# Patient Record
Sex: Female | Born: 1945 | Race: Black or African American | Hispanic: No | Smoking: Never smoker
Health system: Southern US, Community
[De-identification: ages and names within clinical notes are randomized; demographics above are authoritative.]

## PROBLEM LIST (undated history)

## (undated) DIAGNOSIS — E119 Type 2 diabetes mellitus without complications: Secondary | ICD-10-CM

## (undated) DIAGNOSIS — R42 Dizziness and giddiness: Secondary | ICD-10-CM

## (undated) DIAGNOSIS — Z9710 Presence of artificial limb (complete) (partial), unspecified: Secondary | ICD-10-CM

## (undated) DIAGNOSIS — I10 Essential (primary) hypertension: Secondary | ICD-10-CM

## (undated) HISTORY — PX: PARTIAL HYSTERECTOMY: SHX80

---

## 2007-12-21 ENCOUNTER — Ambulatory Visit: Payer: Self-pay | Admitting: Family Medicine

## 2007-12-31 ENCOUNTER — Ambulatory Visit: Payer: Self-pay | Admitting: Orthopedic Surgery

## 2008-01-01 ENCOUNTER — Ambulatory Visit: Payer: Self-pay | Admitting: Orthopedic Surgery

## 2010-05-29 IMAGING — CR LEFT WRIST - 2 VIEW
1 series · 2 of 2 positions shown · non-contrast
Comparison: none

REASON FOR EXAM: post op
COMMENTS:   LMP: Post-Menopausal

[Series 1: view not recorded · 0.17mm/px · 2 of 2 slices shown]
[im 1/2]
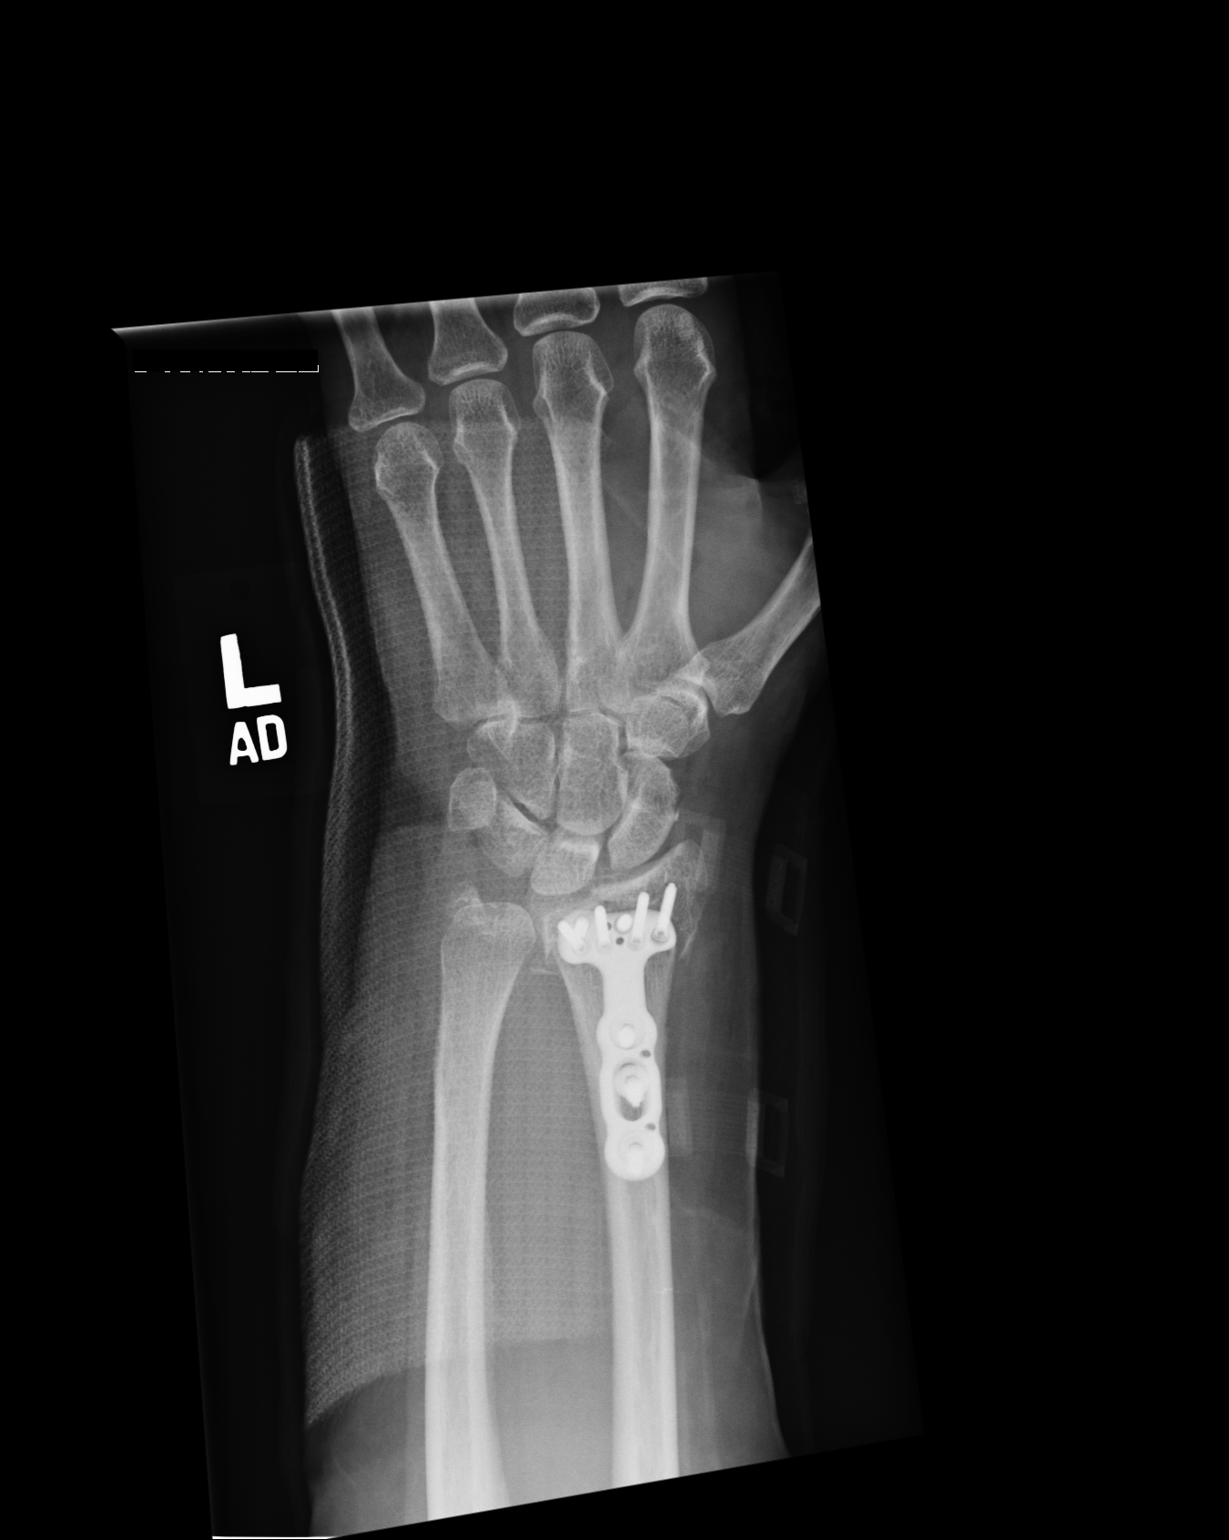
[im 2/2]
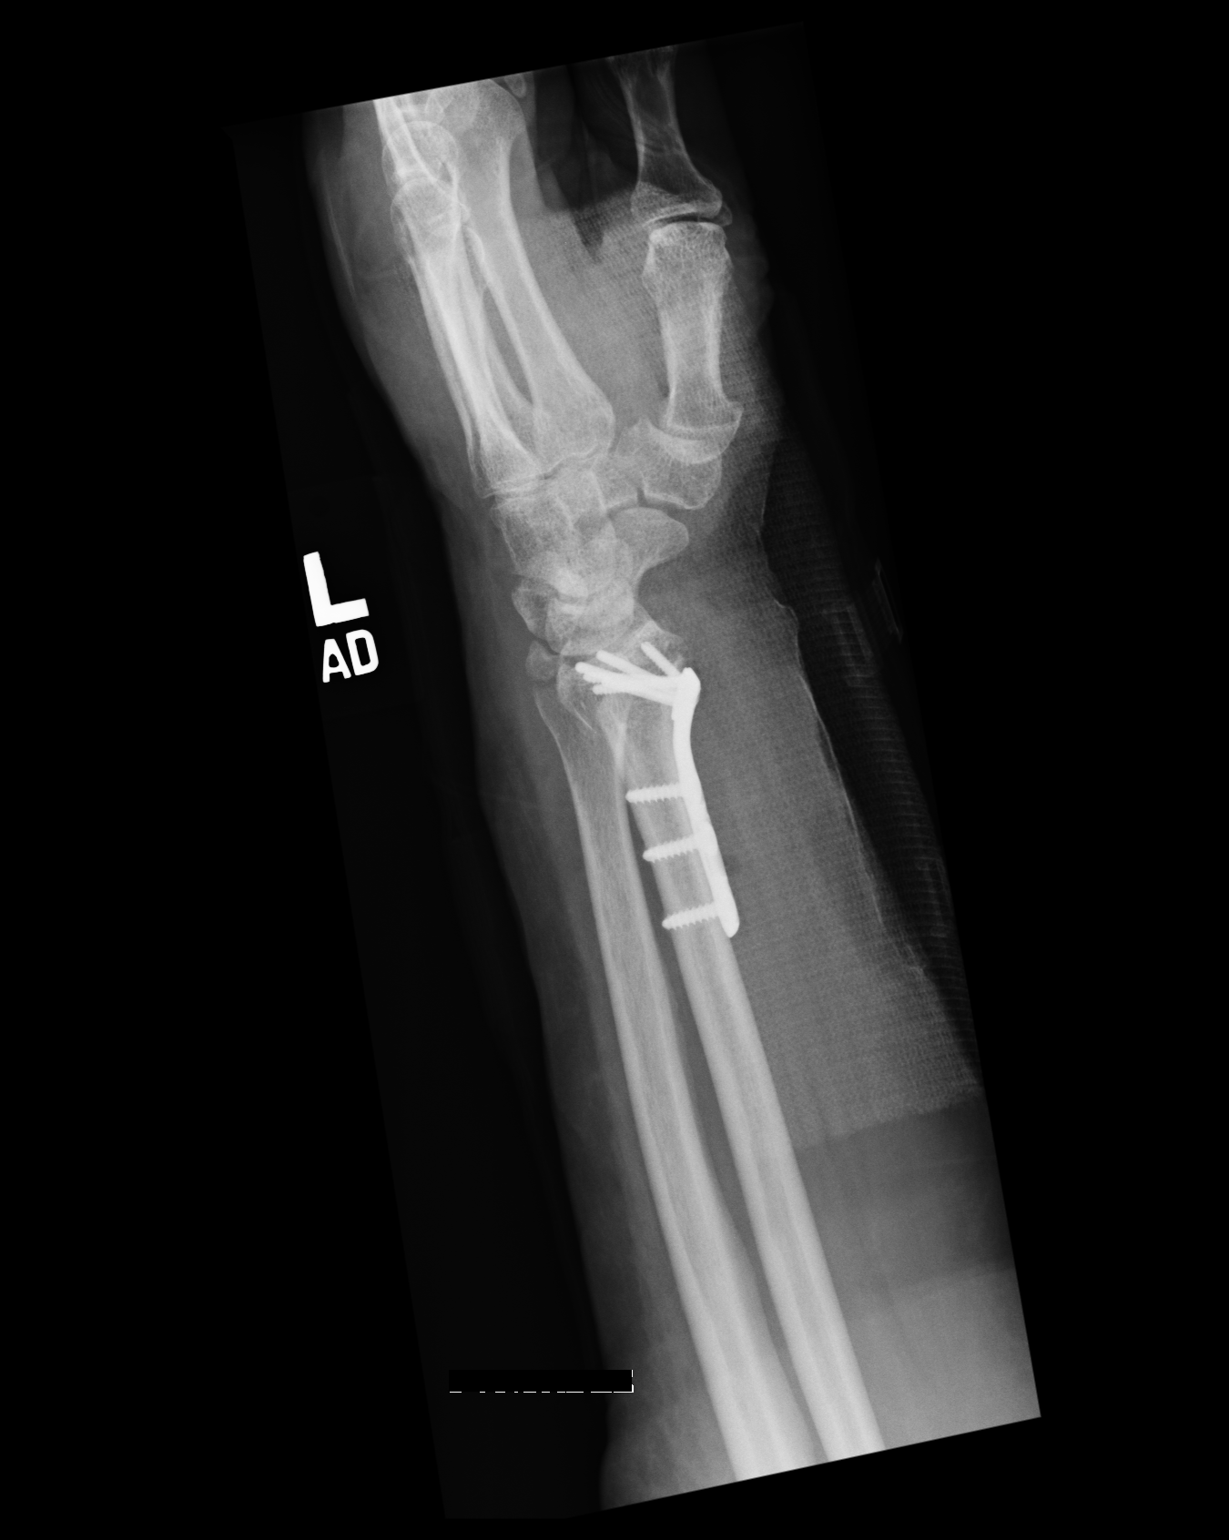

[2 of 2 positions shown; findings below may reference images not displayed]

PROCEDURE:     DXR - DXR WRIST LEFT AP AND LATERAL  - January 01, 2008 [DATE]

RESULT:     There is a comminuted fracture of the distal LEFT radius which
has been reduced and is stabilized by a T-shaped metallic plate with
multiple screws. There is approximately 2 mm step-off along the articular
plate of the distal LEFT radius.

Again observed is a fracture at the base of the ulnar styloid.
IMPRESSION: 1.     Fracture of the distal radius, now internally stabilized as noted
above.

## 2013-05-18 DIAGNOSIS — D649 Anemia, unspecified: Secondary | ICD-10-CM | POA: Insufficient documentation

## 2013-05-18 DIAGNOSIS — I10 Essential (primary) hypertension: Secondary | ICD-10-CM | POA: Insufficient documentation

## 2014-05-17 DIAGNOSIS — E119 Type 2 diabetes mellitus without complications: Secondary | ICD-10-CM | POA: Insufficient documentation

## 2014-05-17 DIAGNOSIS — E785 Hyperlipidemia, unspecified: Secondary | ICD-10-CM | POA: Insufficient documentation

## 2022-03-03 ENCOUNTER — Other Ambulatory Visit: Payer: Self-pay

## 2022-03-03 ENCOUNTER — Emergency Department
Admission: EM | Admit: 2022-03-03 | Discharge: 2022-03-03 | Disposition: A | Discharge status: Home / Self Care | Payer: Medicare Other | Attending: Emergency Medicine | Admitting: Emergency Medicine

## 2022-03-03 DIAGNOSIS — E1165 Type 2 diabetes mellitus with hyperglycemia: Secondary | ICD-10-CM | POA: Insufficient documentation

## 2022-03-03 DIAGNOSIS — I10 Essential (primary) hypertension: Secondary | ICD-10-CM | POA: Diagnosis not present

## 2022-03-03 DIAGNOSIS — R21 Rash and other nonspecific skin eruption: Secondary | ICD-10-CM | POA: Diagnosis present

## 2022-03-03 DIAGNOSIS — L500 Allergic urticaria: Secondary | ICD-10-CM | POA: Insufficient documentation

## 2022-03-03 DIAGNOSIS — T7840XA Allergy, unspecified, initial encounter: Secondary | ICD-10-CM

## 2022-03-03 HISTORY — DX: Essential (primary) hypertension: I10

## 2022-03-03 HISTORY — DX: Type 2 diabetes mellitus without complications: E11.9

## 2022-03-03 LAB — CBG MONITORING, ED
Glucose-Capillary: 386 mg/dL — ABNORMAL HIGH (ref 70–99)
Glucose-Capillary: 352 mg/dL — ABNORMAL HIGH (ref 70–99)

## 2022-03-03 MED ORDER — DIPHENHYDRAMINE HCL 50 MG/ML IJ SOLN
12.5000 mg | Freq: Once | INTRAMUSCULAR | Status: AC
  Administered 2022-03-03: 12.5 mg via INTRAVENOUS
  Filled 2022-03-03: qty 1

## 2022-03-03 MED ORDER — FAMOTIDINE IN NACL 20-0.9 MG/50ML-% IV SOLN
20.0000 mg | Freq: Once | INTRAVENOUS | Status: AC
  Administered 2022-03-03: 20 mg via INTRAVENOUS
  Filled 2022-03-03: qty 50

## 2022-03-03 MED ORDER — METHYLPREDNISOLONE SODIUM SUCC 125 MG IJ SOLR
125.0000 mg | Freq: Once | INTRAMUSCULAR | Status: AC
  Administered 2022-03-03: 125 mg via INTRAVENOUS
  Filled 2022-03-03: qty 2

## 2022-03-03 MED ORDER — DIPHENHYDRAMINE HCL 25 MG PO TABS: 12.5000 mg | ORAL_TABLET | Freq: Four times a day (QID) | ORAL | 0 refills | Status: DC | PRN

## 2022-03-03 MED ORDER — DIPHENHYDRAMINE HCL (SLEEP) 25 MG PO TABS: 12.5000 mg | ORAL_TABLET | Freq: Three times a day (TID) | ORAL | 0 refills | Status: DC | PRN

## 2022-03-03 MED ORDER — SODIUM CHLORIDE 0.9 % IV BOLUS
1000.0000 mL | Freq: Once | INTRAVENOUS | Status: AC
  Administered 2022-03-03: 1000 mL via INTRAVENOUS

## 2022-03-03 NOTE — ED Triage Notes (Addendum)
Pt reports rash and itching to bilateral arms and face since yesterday. She states symptoms started after eating tomatoes. Reports similar reaction many years ago. Pt is AxOx4. No resp distress noted. Pt denies any other complaints.

## 2022-03-03 NOTE — ED Provider Notes (Signed)
[facility] Provider Note    Event Date/Time   First MD Initiated Contact with Patient 03/03/22 1909     (approximate)   History   Allergic Reaction and Rash   HPI  Melanie Juarez is a 77 y.o. female with history of diabetes, hypertension, and as listed in the EMR presents to the emergency department for treatment and evaluation of the hives.  Symptoms started yesterday but resolved and then returned this evening.  She had a similar reaction several years ago to tomatoes.  She thought that because it had been so many years since she had had any reactions that she was no longer allergic to tomatoes and has been consuming tomato sandwiches over the past few days.  She denies any shortness of breath, feeling that her tongue or throat is swelling.  Hives seem to come and go.  No alleviating measures attempted prior to arrival.     Physical Exam   Triage Vital Signs: ED Triage Vitals  Enc Vitals Group     BP 03/03/22 1803 134/71     Pulse Rate 03/03/22 1803 84     Resp 03/03/22 1803 20     Temp 03/03/22 1805 97.8 F (36.6 C)     Temp Source 03/03/22 1805 Oral     SpO2 03/03/22 1803 99 %     Weight 03/03/22 1802 200 lb (90.7 kg)     Height 03/03/22 1802 5\' 7"  (1.702 m)     Head Circumference --      Peak Flow --      Pain Score 03/03/22 1812 0     Pain Loc --      Pain Edu? --      Excl. in [place]? --     Most recent vital signs: Vitals:   03/03/22 2200 03/03/22 2230  BP: (!) 141/65 139/66  Pulse: 86 85  Resp:    Temp:    SpO2: 98% 96%     General: Awake, no distress.  CV:  Good peripheral perfusion.  Resp:  Normal effort.  Abd:  No distention.  Other:  Urticarial lesions to face, arms, and hands   ED Results / Procedures / Treatments   Labs (all labs ordered are listed, but only abnormal results are displayed) Labs Reviewed  CBG MONITORING, ED - Abnormal; Notable for the following components:      Result Value   Glucose-Capillary 386  (*)    All other components within normal limits  CBG MONITORING, ED - Abnormal; Notable for the following components:   Glucose-Capillary 352 (*)    All other components within normal limits     EKG  Not indicated.   RADIOLOGY  Not indicated.  PROCEDURES:  Critical Care performed: No  Procedures   MEDICATIONS ORDERED IN ED: Medications  methylPREDNISolone sodium succinate (SOLU-MEDROL) 125 mg/2 mL injection 125 mg (125 mg Intravenous Given 03/03/22 2054)  diphenhydrAMINE (BENADRYL) injection 12.5 mg (12.5 mg Intravenous Given 03/03/22 2050)  famotidine (PEPCID) IVPB 20 mg premix (0 mg Intravenous Stopped 03/03/22 2155)  sodium chloride 0.9 % bolus 1,000 mL (0 mLs Intravenous Stopped 03/03/22 2201)     IMPRESSION / MDM / ASSESSMENT AND PLAN / ED COURSE  I reviewed the triage vital signs and the nursing notes.                              Differential diagnosis includes, but is not  limited to, Allergic reaction, dermatitis  Patient's presentation is most consistent with acute illness / injury with system symptoms.  77 year old female presenting to the emergency department for treatment and evaluation of urticaria after eating several tomato sandwiches.  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  Patient is a diabetic and has not taken her medications today.  Glucose is 386.  Plan will be to give her a liter of fluids in addition to Benadryl, famotidine, and Solu-Medrol.  Patient symptoms have improved after medications.  CBG is 352.  Patient does not want to take her metformin or glipizide right now because she has not eaten anything.  She was encouraged to take it when she gets home.  She was also advised to maintain a diabetic diet for the next few days.  If she has symptoms of concern she is to call and schedule follow-up appointment with her primary care provider or return to the emergency department.     FINAL CLINICAL  IMPRESSION(S) / ED DIAGNOSES   Final diagnoses:  Allergic reaction, initial encounter     Rx / DC Orders   ED Discharge Orders          Ordered    diphenhydrAMINE ([place]) 25 MG tablet  Every 8 hours PRN,   Status:  Discontinued        03/03/22 2237    diphenhydrAMINE (BENADRYL) 25 MG tablet  Every 6 hours PRN        03/03/22 2254             Note:  This document was prepared using Dragon voice recognition software and may include unintentional dictation errors.   Victorino Dike, FNP 03/05/22 1131    Nance Pear, MD 03/07/22 [phone]

## 2022-03-03 NOTE — ED Notes (Signed)
Patient denies any pruritus.

## 2022-03-03 NOTE — ED Notes (Signed)
Family is with patient.

## 2022-03-03 NOTE — ED Notes (Signed)
Rash appears to be resolving. Patient is eating crackers without difficulty.

## 2022-03-19 ENCOUNTER — Other Ambulatory Visit: Payer: Self-pay

## 2022-03-19 ENCOUNTER — Encounter: Payer: Self-pay | Admitting: Emergency Medicine

## 2022-03-19 ENCOUNTER — Inpatient Hospital Stay
Admission: EM | Admit: 2022-03-19 | Discharge: 2022-03-28 | DRG: 854 | Disposition: A | Discharge status: Skilled Nursing Facility | Payer: Medicare Other | Attending: Internal Medicine | Admitting: Internal Medicine

## 2022-03-19 ENCOUNTER — Inpatient Hospital Stay: Payer: Medicare Other

## 2022-03-19 ENCOUNTER — Emergency Department: Payer: Medicare Other

## 2022-03-19 DIAGNOSIS — L02611 Cutaneous abscess of right foot: Secondary | ICD-10-CM | POA: Diagnosis present

## 2022-03-19 DIAGNOSIS — L03115 Cellulitis of right lower limb: Secondary | ICD-10-CM | POA: Diagnosis present

## 2022-03-19 DIAGNOSIS — D72829 Elevated white blood cell count, unspecified: Secondary | ICD-10-CM

## 2022-03-19 DIAGNOSIS — M869 Osteomyelitis, unspecified: Secondary | ICD-10-CM

## 2022-03-19 DIAGNOSIS — E11649 Type 2 diabetes mellitus with hypoglycemia without coma: Secondary | ICD-10-CM | POA: Diagnosis not present

## 2022-03-19 DIAGNOSIS — I10 Essential (primary) hypertension: Secondary | ICD-10-CM | POA: Diagnosis present

## 2022-03-19 DIAGNOSIS — Z7984 Long term (current) use of oral hypoglycemic drugs: Secondary | ICD-10-CM

## 2022-03-19 DIAGNOSIS — A419 Sepsis, unspecified organism: Secondary | ICD-10-CM | POA: Diagnosis present

## 2022-03-19 DIAGNOSIS — I70291 Other atherosclerosis of native arteries of extremities, right leg: Secondary | ICD-10-CM | POA: Diagnosis not present

## 2022-03-19 DIAGNOSIS — M86171 Other acute osteomyelitis, right ankle and foot: Secondary | ICD-10-CM | POA: Diagnosis present

## 2022-03-19 DIAGNOSIS — I739 Peripheral vascular disease, unspecified: Secondary | ICD-10-CM | POA: Insufficient documentation

## 2022-03-19 DIAGNOSIS — K5900 Constipation, unspecified: Secondary | ICD-10-CM | POA: Diagnosis not present

## 2022-03-19 DIAGNOSIS — D62 Acute posthemorrhagic anemia: Secondary | ICD-10-CM | POA: Diagnosis not present

## 2022-03-19 DIAGNOSIS — I70261 Atherosclerosis of native arteries of extremities with gangrene, right leg: Secondary | ICD-10-CM | POA: Diagnosis present

## 2022-03-19 DIAGNOSIS — R6520 Severe sepsis without septic shock: Secondary | ICD-10-CM

## 2022-03-19 DIAGNOSIS — E11628 Type 2 diabetes mellitus with other skin complications: Secondary | ICD-10-CM | POA: Diagnosis present

## 2022-03-19 DIAGNOSIS — E11621 Type 2 diabetes mellitus with foot ulcer: Secondary | ICD-10-CM | POA: Diagnosis present

## 2022-03-19 DIAGNOSIS — E1165 Type 2 diabetes mellitus with hyperglycemia: Secondary | ICD-10-CM | POA: Diagnosis present

## 2022-03-19 DIAGNOSIS — E1140 Type 2 diabetes mellitus with diabetic neuropathy, unspecified: Secondary | ICD-10-CM | POA: Diagnosis present

## 2022-03-19 DIAGNOSIS — Z79899 Other long term (current) drug therapy: Secondary | ICD-10-CM | POA: Diagnosis not present

## 2022-03-19 DIAGNOSIS — E669 Obesity, unspecified: Secondary | ICD-10-CM | POA: Insufficient documentation

## 2022-03-19 DIAGNOSIS — E119 Type 2 diabetes mellitus without complications: Secondary | ICD-10-CM

## 2022-03-19 DIAGNOSIS — L97519 Non-pressure chronic ulcer of other part of right foot with unspecified severity: Secondary | ICD-10-CM | POA: Diagnosis present

## 2022-03-19 DIAGNOSIS — E1152 Type 2 diabetes mellitus with diabetic peripheral angiopathy with gangrene: Secondary | ICD-10-CM | POA: Diagnosis present

## 2022-03-19 DIAGNOSIS — L089 Local infection of the skin and subcutaneous tissue, unspecified: Principal | ICD-10-CM | POA: Insufficient documentation

## 2022-03-19 DIAGNOSIS — E785 Hyperlipidemia, unspecified: Secondary | ICD-10-CM | POA: Insufficient documentation

## 2022-03-19 DIAGNOSIS — Z91018 Allergy to other foods: Secondary | ICD-10-CM

## 2022-03-19 DIAGNOSIS — E872 Acidosis, unspecified: Secondary | ICD-10-CM | POA: Diagnosis present

## 2022-03-19 DIAGNOSIS — Z6832 Body mass index (BMI) 32.0-32.9, adult: Secondary | ICD-10-CM | POA: Diagnosis not present

## 2022-03-19 DIAGNOSIS — M65871 Other synovitis and tenosynovitis, right ankle and foot: Secondary | ICD-10-CM | POA: Diagnosis present

## 2022-03-19 DIAGNOSIS — N179 Acute kidney failure, unspecified: Secondary | ICD-10-CM | POA: Diagnosis present

## 2022-03-19 DIAGNOSIS — D72825 Bandemia: Secondary | ICD-10-CM | POA: Diagnosis not present

## 2022-03-19 DIAGNOSIS — E1169 Type 2 diabetes mellitus with other specified complication: Secondary | ICD-10-CM | POA: Diagnosis present

## 2022-03-19 DIAGNOSIS — M19071 Primary osteoarthritis, right ankle and foot: Secondary | ICD-10-CM | POA: Diagnosis present

## 2022-03-19 DIAGNOSIS — E871 Hypo-osmolality and hyponatremia: Secondary | ICD-10-CM | POA: Diagnosis present

## 2022-03-19 LAB — CBG MONITORING, ED
Glucose-Capillary: 417 mg/dL — ABNORMAL HIGH (ref 70–99)
Glucose-Capillary: 384 mg/dL — ABNORMAL HIGH (ref 70–99)
Glucose-Capillary: 203 mg/dL — ABNORMAL HIGH (ref 70–99)
Glucose-Capillary: 139 mg/dL — ABNORMAL HIGH (ref 70–99)

## 2022-03-19 LAB — BASIC METABOLIC PANEL
Anion gap: 12 (ref 5–15)
BUN: 28 mg/dL — ABNORMAL HIGH (ref 8–23)
CO2: 24 mmol/L (ref 22–32)
Calcium: 8.5 mg/dL — ABNORMAL LOW (ref 8.9–10.3)
Chloride: 88 mmol/L — ABNORMAL LOW (ref 98–111)
Creatinine, Ser: 1.36 mg/dL — ABNORMAL HIGH (ref 0.44–1.00)
GFR, Estimated: 41 mL/min — ABNORMAL LOW (ref >60)
Glucose, Bld: 422 mg/dL — ABNORMAL HIGH (ref 70–99)
Potassium: 5 mmol/L (ref 3.5–5.1)
Sodium: 124 mmol/L — ABNORMAL LOW (ref 135–145)

## 2022-03-19 LAB — CBC WITH DIFFERENTIAL/PLATELET
Abs Immature Granulocytes: 0.34 10*3/uL — ABNORMAL HIGH (ref 0.00–0.07)
Basophils Absolute: 0.1 10*3/uL (ref 0.0–0.1)
Basophils Relative: 0 %
Eosinophils Absolute: 0.1 10*3/uL (ref 0.0–0.5)
Eosinophils Relative: 1 %
HCT: 33.3 % — ABNORMAL LOW (ref 36.0–46.0)
Hemoglobin: 10.6 g/dL — ABNORMAL LOW (ref 12.0–15.0)
Immature Granulocytes: 2 %
Lymphocytes Relative: 7 %
Lymphs Abs: 1.3 10*3/uL (ref 0.7–4.0)
MCH: 26.4 pg (ref 26.0–34.0)
MCHC: 31.8 g/dL (ref 30.0–36.0)
MCV: 83 fL (ref 80.0–100.0)
Monocytes Absolute: 1.4 10*3/uL — ABNORMAL HIGH (ref 0.1–1.0)
Monocytes Relative: 7 %
Neutro Abs: 16.1 10*3/uL — ABNORMAL HIGH (ref 1.7–7.7)
Neutrophils Relative %: 83 %
Platelets: 352 10*3/uL (ref 150–400)
RBC: 4.01 MIL/uL (ref 3.87–5.11)
RDW: 14.2 % (ref 11.5–15.5)
WBC: 19.2 10*3/uL — ABNORMAL HIGH (ref 4.0–10.5)
nRBC: 0 % (ref 0.0–0.2)

## 2022-03-19 LAB — LACTIC ACID, PLASMA
Lactic Acid, Venous: 1.4 mmol/L (ref 0.5–1.9)
Lactic Acid, Venous: 2.6 mmol/L (ref 0.5–1.9)
Lactic Acid, Venous: 1.3 mmol/L (ref 0.5–1.9)

## 2022-03-19 MED ORDER — INSULIN ASPART 100 UNIT/ML IJ SOLN
0.0000 [IU] | Freq: Three times a day (TID) | INTRAMUSCULAR | Status: DC
  Administered 2022-03-19 – 2022-03-20 (×2): 3 [IU] via SUBCUTANEOUS
  Administered 2022-03-20: 1 [IU] via SUBCUTANEOUS
  Administered 2022-03-20 – 2022-03-21 (×2): 3 [IU] via SUBCUTANEOUS
  Filled 2022-03-19 (×6): qty 1

## 2022-03-19 MED ORDER — OXYCODONE-ACETAMINOPHEN 5-325 MG PO TABS
1.0000 | ORAL_TABLET | Freq: Four times a day (QID) | ORAL | Status: AC | PRN
  Administered 2022-03-24 – 2022-03-26 (×4): 1 via ORAL
  Filled 2022-03-19 (×4): qty 1

## 2022-03-19 MED ORDER — ATENOLOL 50 MG PO TABS
50.0000 mg | ORAL_TABLET | Freq: Every day | ORAL | Status: DC
  Administered 2022-03-19 – 2022-03-28 (×8): 50 mg via ORAL
  Filled 2022-03-19 (×9): qty 1
  Filled 2022-03-19: qty 2

## 2022-03-19 MED ORDER — METRONIDAZOLE 500 MG/100ML IV SOLN
500.0000 mg | Freq: Two times a day (BID) | INTRAVENOUS | Status: AC
  Administered 2022-03-19 – 2022-03-26 (×13): 500 mg via INTRAVENOUS
  Filled 2022-03-19 (×14): qty 100

## 2022-03-19 MED ORDER — ONDANSETRON HCL 4 MG/2ML IJ SOLN
4.0000 mg | Freq: Four times a day (QID) | INTRAMUSCULAR | Status: AC | PRN
  Filled 2022-03-19: qty 2

## 2022-03-19 MED ORDER — HYDRALAZINE HCL 20 MG/ML IJ SOLN: 5.0000 mg | Freq: Three times a day (TID) | INTRAMUSCULAR | Status: AC | PRN

## 2022-03-19 MED ORDER — ACETAMINOPHEN 650 MG RE SUPP: 650.0000 mg | Freq: Four times a day (QID) | RECTAL | Status: AC | PRN

## 2022-03-19 MED ORDER — SODIUM CHLORIDE 0.9 % IV SOLN: INTRAVENOUS | Status: AC

## 2022-03-19 MED ORDER — INSULIN ASPART 100 UNIT/ML IJ SOLN
0.0000 [IU] | Freq: Every day | INTRAMUSCULAR | Status: DC
  Administered 2022-03-20: 3 [IU] via SUBCUTANEOUS
  Filled 2022-03-19: qty 1

## 2022-03-19 MED ORDER — SODIUM CHLORIDE 0.9 % IV BOLUS
1000.0000 mL | Freq: Once | INTRAVENOUS | Status: AC
  Administered 2022-03-19: 1000 mL via INTRAVENOUS

## 2022-03-19 MED ORDER — VANCOMYCIN HCL 2000 MG/400ML IV SOLN
2000.0000 mg | Freq: Once | INTRAVENOUS | Status: AC
  Administered 2022-03-19: 2000 mg via INTRAVENOUS
  Filled 2022-03-19: qty 400

## 2022-03-19 MED ORDER — MORPHINE SULFATE (PF) 4 MG/ML IV SOLN: 4.0000 mg | INTRAVENOUS | Status: DC | PRN

## 2022-03-19 MED ORDER — SODIUM CHLORIDE 0.9 % IV BOLUS
2000.0000 mL | Freq: Once | INTRAVENOUS | Status: AC
  Administered 2022-03-19: 2000 mL via INTRAVENOUS

## 2022-03-19 MED ORDER — INSULIN ASPART 100 UNIT/ML IJ SOLN
15.0000 [IU] | Freq: Once | INTRAMUSCULAR | Status: AC
  Administered 2022-03-19: 15 [IU] via SUBCUTANEOUS
  Filled 2022-03-19: qty 1

## 2022-03-19 MED ORDER — VANCOMYCIN HCL IN DEXTROSE 1-5 GM/200ML-% IV SOLN
1000.0000 mg | INTRAVENOUS | Status: DC
  Administered 2022-03-20: 1000 mg via INTRAVENOUS
  Filled 2022-03-19 (×2): qty 200

## 2022-03-19 MED ORDER — ACETAMINOPHEN 325 MG PO TABS
650.0000 mg | ORAL_TABLET | Freq: Four times a day (QID) | ORAL | Status: AC | PRN
  Administered 2022-03-21: 650 mg via ORAL
  Filled 2022-03-19: qty 2

## 2022-03-19 MED ORDER — HEPARIN SODIUM (PORCINE) 5000 UNIT/ML IJ SOLN
5000.0000 [IU] | Freq: Three times a day (TID) | INTRAMUSCULAR | Status: AC
  Administered 2022-03-19 – 2022-03-20 (×2): 5000 [IU] via SUBCUTANEOUS
  Filled 2022-03-19 (×2): qty 1

## 2022-03-19 MED ORDER — ONDANSETRON HCL 4 MG PO TABS: 4.0000 mg | ORAL_TABLET | Freq: Four times a day (QID) | ORAL | Status: AC | PRN

## 2022-03-19 MED ORDER — SODIUM CHLORIDE 0.9 % IV SOLN: 2.0000 g | INTRAVENOUS | Status: DC

## 2022-03-19 MED ORDER — SODIUM CHLORIDE 0.9 % IV SOLN
2.0000 g | Freq: Two times a day (BID) | INTRAVENOUS | Status: DC
  Administered 2022-03-19 – 2022-03-21 (×4): 2 g via INTRAVENOUS
  Filled 2022-03-19 (×2): qty 12.5
  Filled 2022-03-19: qty 2
  Filled 2022-03-19 (×2): qty 12.5

## 2022-03-19 NOTE — Assessment & Plan Note (Signed)
-   Elevated lactic acid, leukocytosis, source of right foot, organ is renal involvement - Sodium chloride 2 L bolus and 1 L bolus ordered  - Sodium chloride 150 milliliters per hour, 1 day ordered - Continue broad-spectrum antibiotics - Follow third lactic acid, discussed with nursing via secure chat

## 2022-03-19 NOTE — Consult Note (Signed)
Reason for Consult: Osteomyelitis with extensive infection right foot Referring Physician: Ijeoma Loor is an 77 y.o. female.  HPI: This is a 77 year old female with diabetes and associated neuropathy who relates a 1 week history of draining ulcerations in her right foot.  Has had significant increase in redness and swelling as well as drainage from sores between her toes and has now noticed malodor.  Was seen by her primary care physician earlier today who referred for urgent evaluation with podiatry.  Due to the extensive infection decision was immediately made for hospitalization for IV antibiotics and MRI evaluation to determine level of amputation.  Past Medical History:  Diagnosis Date   Diabetes ([place])    Hypertension     History reviewed. No pertinent surgical history.  History reviewed. No pertinent family history.  Social History:  reports that she has never smoked. She has never used smokeless tobacco. She reports that she does not drink alcohol and does not use drugs.  Allergies:  Allergies  Allergen Reactions   Tomato Flavor [Flavoring Agent] Swelling    Medications: Scheduled:  heparin  5,000 Units Subcutaneous Q8H   insulin aspart  0-5 Units Subcutaneous QHS   insulin aspart  0-9 Units Subcutaneous TID WC    Results for orders placed or performed during the hospital encounter of 03/19/22 (from the past 48 hour(s))  Lactic acid, plasma     Status: None   Collection Time: 03/19/22 12:46 PM  Result Value Ref Range   Lactic Acid, Venous 1.4 0.5 - 1.9 mmol/L    Comment: Performed at [facility], [address] [place], [place] [address]  CBC with Differential     Status: Abnormal   Collection Time: 03/19/22 12:46 PM  Result Value Ref Range   WBC 19.2 (H) 4.0 - 10.5 K/uL   RBC 4.01 3.87 - 5.11 MIL/uL   Hemoglobin 10.6 (L) 12.0 - 15.0 g/dL   HCT 33.3 (L) 36.0 - 46.0 %   MCV 83.0 80.0 - 100.0 fL   MCH 26.4 26.0 - 34.0 pg   MCHC 31.8 30.0 - 36.0  g/dL   RDW 14.2 11.5 - 15.5 %   Platelets 352 150 - 400 K/uL   nRBC 0.0 0.0 - 0.2 %   Neutrophils Relative % 83 %   Neutro Abs 16.1 (H) 1.7 - 7.7 K/uL   Lymphocytes Relative 7 %   Lymphs Abs 1.3 0.7 - 4.0 K/uL   Monocytes Relative 7 %   Monocytes Absolute 1.4 (H) 0.1 - 1.0 K/uL   Eosinophils Relative 1 %   Eosinophils Absolute 0.1 0.0 - 0.5 K/uL   Basophils Relative 0 %   Basophils Absolute 0.1 0.0 - 0.1 K/uL   Immature Granulocytes 2 %   Abs Immature Granulocytes 0.34 (H) 0.00 - 0.07 K/uL    Comment: Performed at [facility], [address] [place], [place] [address]  Basic metabolic panel     Status: Abnormal   Collection Time: 03/19/22 12:46 PM  Result Value Ref Range   Sodium 124 (L) 135 - 145 mmol/L   Potassium 5.0 3.5 - 5.1 mmol/L   Chloride 88 (L) 98 - 111 mmol/L   CO2 24 22 - 32 mmol/L   Glucose, Bld 422 (H) 70 - 99 mg/dL    Comment: Glucose reference range applies only to samples taken after fasting for at least 8 hours.   BUN 28 (H) 8 - 23 mg/dL   Creatinine, Ser 1.36 (H)  0.44 - 1.00 mg/dL   Calcium 8.5 (L) 8.9 - 10.3 mg/dL   GFR, Estimated 41 (L) >60 mL/min    Comment: (NOTE) Calculated using the CKD-EPI Creatinine Equation (2021)    Anion gap 12 5 - 15    Comment: Performed at [facility], [address] [place], [place] [address]  CBG monitoring, ED     Status: Abnormal   Collection Time: 03/19/22  3:22 PM  Result Value Ref Range   Glucose-Capillary 417 (H) 70 - 99 mg/dL    Comment: Glucose reference range applies only to samples taken after fasting for at least 8 hours.  CBG monitoring, ED     Status: Abnormal   Collection Time: 03/19/22  4:06 PM  Result Value Ref Range   Glucose-Capillary 384 (H) 70 - 99 mg/dL    Comment: Glucose reference range applies only to samples taken after fasting for at least 8 hours.    DG Foot Complete Right  Result Date: 03/19/2022 CLINICAL DATA:  Assess for possible right foot osteomyelitis. Localized  pain. EXAM: RIGHT FOOT COMPLETE - 3+ VIEW COMPARISON:  None. FINDINGS: Bony resorption with associated osteopenia of the distal fourth and fifth metatarsals, including the metatarsal heads, more severely of the fourth and the fifth. Suspected resorption at the base of the proximal phalanx of the fourth toe, possibly also of the fifth, assessment limited by positioning. No other evidence of osteomyelitis. Previous amputation of the great toe. Joints are normally aligned. Soft tissue swelling of the forefoot, and most prominently of the fourth toe. IMPRESSION: 1. Positive for osteomyelitis. There is osteomyelitis of the distal fourth and fifth metatarsals, more severely of the fourth, and of the base of the fourth toe proximal phalanx, possibly of the base of the fifth toe proximal phalanx. Electronically Signed   By: Lajean Manes M.D.   On: 03/19/2022 14:58    Review of Systems  Constitutional:  Positive for appetite change. Negative for chills and fever.  HENT:  Negative for sinus pain and sore throat.   Respiratory:  Negative for cough and shortness of breath.   Cardiovascular:  Negative for chest pain and palpitations.  Gastrointestinal:  Negative for nausea and vomiting.  Endocrine: Negative for polydipsia and polyuria.  Genitourinary:  Negative for frequency and urgency.  Musculoskeletal:        Recent significant increase in pain in her right foot, aggravated with activity and walking.  Relates previous amputation of her right great toe.  Skin:        Recent increase of swelling and redness in her right foot with draining ulcerations between the toes.  Recently noticed some malodor associated with this.  Neurological:        Does have numbness in the feet associated with her diabetes.  Psychiatric/Behavioral:  Negative for confusion. The patient is nervous/anxious.    Blood pressure 127/63, pulse 95, temperature 98.6 F (37 C), temperature source Oral, resp. rate 19, height 5' 7.5" (1.715  m), weight 90.7 kg, SpO2 97 %. Physical Exam Cardiovascular:     Comments: DP and PT pulses are palpable on the right.  Diminished on the left. Musculoskeletal:     Comments: Previous amputation on the right hallux.  Some pain is noted on palpation and motion in the right foot.  Also some pain on compression of the posterior calf area and ankle.  Skin:    Comments: The skin is warm dry and atrophic with significant edema and erythema in the right  foot and ankle with hyperpigmentary changes.  Diminished hair growth.  Necrotic appearing ulcerations are noted in the third and fourth interspace bilateral as well as the lateral fifth metatarsal head on the right foot.  Purulence is noted from the wounds.  Neurological:     Comments: Loss of protective threshold with monofilament wire in the feet and toes bilateral.     Assessment/Plan: Assessment: 1.  Osteomyelitis right fourth toe and metatarsal. 2.  Cellulitis with abscess right foot. 3.  Multiple full-thickness ulcerations right foot. 4.  Diabetes with associated neuropathy.  Plan: Obtain MRIs of the foot and ankle for evaluation of the extent of the infection.  Discussed with the patient and her daughter that at a minimum she will most likely require a transmetatarsal amputation but that if the infection extends further through her foot and up into the leg area that she could be at risk for a higher amputation.  We will obtain vascular consult for evaluation, especially since she is at significant risk for limb loss.  I will evaluate for more definitive plan once her MRI reports have returned.  Plan for admission and IV antibiotics in the meantime.  Durward Fortes 03/19/2022, 5:21 PM

## 2022-03-19 NOTE — Hospital Course (Signed)
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  Initial vitals in the ED showed temperature of 98.6, respiration rate of 19, heart rate 95, blood pressure 127/63, SpO2 of 97% on room air.  Serum sodium is 124, potassium 5.0, chloride 88, bicarb 24, BUN of 28, serum creatinine of 1.36, EGFR 41, nonfasting blood glucose 422, WBC 19.2, hemoglobin 10.6, platelets of 352.  Lactic acid is 1.4.  ED treatment: Ceftriaxone 2 g IV daily, 7 days ordered; Flagyl 500 mg IV every 12 hours 7 days ordered, vancomycin one-time dose

## 2022-03-19 NOTE — ED Triage Notes (Signed)
Patient Sent by podiatry Melanie Juarez) for IV antibiotics, MRI, and admission for possible osteomylitis of RIGHT foot; Patient reports swelling, pain and foul smell from foot for the last few weeks; Patient already has RIGHT great toe amputation from gangrene 20 years ago

## 2022-03-19 NOTE — Assessment & Plan Note (Signed)
-   Cefepime and vancomycin per pharmacy - Continue with metronidazole per EDP - Check blood cultures x 2 given markedly elevated white cell count concerning for hematogenous spread - Admit to telemetry medical, inpatient

## 2022-03-19 NOTE — ED Notes (Addendum)
Report given to Le[place], RN  

## 2022-03-19 NOTE — ED Provider Notes (Signed)
[facility] Provider Note    Event Date/Time   First MD Initiated Contact with Patient 03/19/22 1423     (approximate)   History   Wound Infection (Patient Sent by podiatry [name]) for IV antibiotics, MRI, and admission for possible osteomylitis of RIGHT foot; Patient reports swelling, pain and foul smell from foot for the last few weeks; Patient already has RIGHT great toe amputation from gangrene 20 years ago)   HPI  Melanie Juarez is a 77 y.o. 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  Patient went to [place] clinic.  She reports that she has had swelling, pain, and a foul smell coming from her foot for a couple of weeks.  Jefm Bryant has arranged for her to see podiatry today, and there was concern for osteomyelitis and gangrene.  She was sent to the emergency department for admission, MRI, broad-spectrum antibiotics, and possible surgical intervention.  Patient reports that she first noticed a blister between her third and fourth toes approximately 2 weeks ago and has been gotten progressively more swollen and malodorous.  She reports that her foot has gotten swollen as well.  No fevers or chills but reports that her foot is very warm.  She reports that her foot is too painful to walk.  Patient Active Problem List   Diagnosis Date Noted   Osteomyelitis ([place]) 03/19/2022   Diabetes mellitus type 2, noninsulin dependent ([place]) 03/19/2022          Physical Exam   Triage Vital Signs: ED Triage Vitals  Enc Vitals Group     BP 03/19/22 1246 127/63     Pulse Rate 03/19/22 1246 95     Resp 03/19/22 1246 19     Temp 03/19/22 1246 98.6 F (37 C)     Temp Source 03/19/22 1246 Oral     SpO2 03/19/22 1246 97 %     Weight --      Height 03/19/22 1243 5' 7.5" (1.715 m)     Head Circumference --      Peak Flow --      Pain Score  03/19/22 1243 10     Pain Loc --      Pain Edu? --      Excl. in [place]? --     Most recent vital signs: Vitals:   03/19/22 1246  BP: 127/63  Pulse: 95  Resp: 19  Temp: 98.6 F (37 C)  SpO2: 97%    Physical Exam Vitals and nursing note reviewed.  Constitutional:      General: Awake and alert. No acute distress.    Appearance: Normal appearance. The patient is normal weight.  HENT:     Head: Normocephalic and atraumatic.     Mouth: Mucous membranes are moist.  Eyes:     General: PERRL. Normal EOMs        Right eye: No discharge.        Left eye: No discharge.     Conjunctiva/sclera: Conjunctivae normal.  Cardiovascular:     Rate and Rhythm: Normal rate and regular rhythm.     Pulses: Normal pulses.  Pulmonary:     Effort: Pulmonary effort is normal. No respiratory distress.     Breath sounds: Normal breath sounds.  Abdominal:     Abdomen is soft. There is no abdominal tenderness. No rebound or guarding.  No distention. Musculoskeletal:        General: No swelling. Normal range of motion.     Cervical back: Normal range of motion and neck supple. Right lower extremity: Purulence and foul smelling discharge from wound between third and fourth toe.  Swelling to the fourth toe without significant tenderness.  Absent great toe.  Foot is warm and well-perfused.  She has 2+ pitting edema to the entirety of her dorsum of the foot to her lower leg.  No lymphangitis.  Dry flaky scaly skin noted.  No lymphangitis noted. Skin:    General: Skin is warm and dry.     Capillary Refill: Capillary refill takes less than 2 seconds.     Findings: No rash.  Neurological:     Mental Status: The patient is awake and alert.      ED Results / Procedures / Treatments   Labs (all labs ordered are listed, but only abnormal results are displayed) Labs Reviewed  CBC WITH DIFFERENTIAL/PLATELET - Abnormal; Notable for the following components:      Result Value   WBC 19.2 (*)    Hemoglobin 10.6  (*)    HCT 33.3 (*)    Neutro Abs 16.1 (*)    Monocytes Absolute 1.4 (*)    Abs Immature Granulocytes 0.34 (*)    All other components within normal limits  BASIC METABOLIC PANEL - Abnormal; Notable for the following components:   Sodium 124 (*)    Chloride 88 (*)    Glucose, Bld 422 (*)    BUN 28 (*)    Creatinine, Ser 1.36 (*)    Calcium 8.5 (*)    GFR, Estimated 41 (*)    All other components within normal limits  CULTURE, BLOOD (ROUTINE X 2)  CULTURE, BLOOD (ROUTINE X 2)  LACTIC ACID, PLASMA  LACTIC ACID, PLASMA     EKG     RADIOLOGY I independently reviewed and interpreted imaging and agree with radiologists findings.     PROCEDURES:  Critical Care performed:   .Critical Care  Performed by: Marquette Old, PA-C Authorized by: Marquette Old, PA-C   Critical care provider statement:    Critical care time (minutes):  30   Critical care was necessary to treat or prevent imminent or life-threatening deterioration of the following conditions: threat to limb.   Critical care was time spent personally by me on the following activities:  Development of treatment plan with patient or surrogate, discussions with consultants, evaluation of patient's response to treatment, examination of patient, ordering and review of laboratory studies, ordering and review of radiographic studies, ordering and performing treatments and interventions, pulse oximetry, re-evaluation of patient's condition and review of old charts   I assumed direction of critical care for this patient from another provider in my specialty: no     Care discussed with: admitting provider      MEDICATIONS ORDERED IN ED: Medications  metroNIDAZOLE (FLAGYL) IVPB 500 mg (has no administration in time range)  vancomycin (VANCOREADY) IVPB 2000 mg/400 mL (has no administration in time range)  acetaminophen (TYLENOL) tablet 650 mg (has no administration in time range)    Or  acetaminophen (TYLENOL) suppository  650 mg (has no administration in time range)  ondansetron (ZOFRAN) tablet 4 mg (has no administration in time range)    Or  ondansetron (ZOFRAN) injection 4 mg (has no administration in time range)  heparin injection 5,000 Units (has no administration in time range)  insulin aspart (novoLOG) injection  15 Units (has no administration in time range)  insulin aspart (novoLOG) injection 0-9 Units (has no administration in time range)  insulin aspart (novoLOG) injection 0-5 Units (has no administration in time range)  hydrALAZINE (APRESOLINE) injection 5 mg (has no administration in time range)     IMPRESSION / MDM / ASSESSMENT AND PLAN / ED COURSE  I reviewed the triage vital signs and the nursing notes.   Differential diagnosis includes, but is not limited to, osteomyelitis, purulent cellulitis, abscess, gangrene.  Patient is awake and alert, hemodynamically stable and afebrile.  She was placed on the cardiac monitor.  I reviewed the patient's chart.  She was seen in clinic by Dr. Providence Crosby today, who was able to arrange a podiatry consult.  Patient was subsequently seen by Dr. [name] who has recommended broad-spectrum antibiotics, MRI, admission to the hospital, NPO at midnight and possible OR tomorrow.   Patient has foul-smelling right foot with malodorous purulent drainage.  Labs obtained in triage are revealing for leukocytosis to 19.  Her lactate is within normal limits.  Broad-spectrum antibiotics were initiated.  MRI was ordered.  Case was discussed with Dr. Tobie Poet who has agreed to admit the patient..  Patient's presentation is most consistent with acute presentation with potential threat to life or bodily function.     FINAL CLINICAL IMPRESSION(S) / ED DIAGNOSES   Final diagnoses:  Diabetic infection of right foot ([place])     Rx / DC Orders   ED Discharge Orders     None        Note:  This document was prepared using Dragon voice recognition software and may include  unintentional dictation errors.   Marquette Old, PA-C 03/19/22 1500    Harvest Dark, MD 03/20/22 1146

## 2022-03-19 NOTE — Assessment & Plan Note (Signed)
Patient meets criteria for sepsis, leukocytosis, elevated lactic acid, source of diabetic foot infection - Given increased lactic acid and marked leukocytosis with source of infection of a diabetic foot wound, sepsis cannot be ruled out at this time - Presumed secondary to osteomyelitis - Blood cultures x 2 and are in process - Will check a third lactic acid - Continue cefepime, vancomycin, metronidazole 500 mg IV twice daily - Admit to telemetry medical, inpatient

## 2022-03-19 NOTE — Assessment & Plan Note (Signed)
-   Etiology workup in progress, differentials include prerenal secondary to poor p.o. intake versus sepsis with organ involvement

## 2022-03-19 NOTE — Assessment & Plan Note (Addendum)
-   Resumed atenolol 50 mg daily - Hydralazine 5 mg IV every 8 hours as needed for SBP greater than 175, 4 days ordered

## 2022-03-19 NOTE — Assessment & Plan Note (Addendum)
-   Home glipizide 5 mg and metformin daily were not resumed on admission - One-time dose of insulin aspart 15 units on admission for blood glucose level of 422 - Insulin SSI with at bedtime coverage ordered - Goal inpatient blood glucose level is 140-180

## 2022-03-19 NOTE — Consult Note (Signed)
Pharmacy Antibiotic Note  Melanie Juarez is a 77 y.o. female admitted on 03/19/2022 with  evaluation for potential right foot infection . 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  Pharmacy has been consulted for Vancomycin and Cefepime dosing.  Plan: Vancomycin 2g IV x 1 as loading dose followed by 1000 mg IV Q 24 hrs. Goal AUC 400-550. Expected AUC: 447.2 Expected Css: 12.4 SCr used: 1.36(unclear baseline), Vd used: 0.72  Cefepime 2g IV Q12 hours   Height: 5' 7.5" (171.5 cm) Weight: 90.7 kg (199 lb 15.3 oz) IBW/kg (Calculated) : 62.75  Temp (24hrs), Avg:98.6 F (37 C), Min:98.6 F (37 C), Max:98.6 F (37 C)  Recent Labs  Lab 03/19/22 1246  WBC 19.2*  CREATININE 1.36*  LATICACIDVEN 1.4    Estimated Creatinine Clearance: 42.4 mL/min (A) (by C-G formula based on SCr of 1.36 mg/dL (H)).    Allergies  Allergen Reactions   Tomato Flavor [Flavoring Agent] Swelling    Antimicrobials this admission: Vancomycin 2/5 >>  Cefepime 2/5 >>  Flagyl 2/5 >>  Dose adjustments this admission: N/A  Microbiology results: 2/5 BCx: pending  Thank you for allowing pharmacy to be a part of this patient's care.  Zeda Gangwer A Jayana Kotula 03/19/2022 3:01 PM

## 2022-03-19 NOTE — H&P (Addendum)
History and Physical   Melanie Juarez NFA:[id number] DOB: 08-Feb-1946 DOA: 03/19/2022  PCP: System, Provider Not In  Outpatient Specialists: Dr. Alberteen Spindle, podiatry Patient coming from: Podiatry  I have personally briefly reviewed patient's old medical records in [facility] EMR.  Chief Concern: Right foot infection  HPI: 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  Initial vitals in the ED showed temperature of 98.6, respiration rate of 19, heart rate 95, blood pressure 127/63, SpO2 of 97% on room air.  Serum sodium is 124, potassium 5.0, chloride 88, bicarb 24, BUN of 28, serum creatinine of 1.36, EGFR 41, nonfasting blood glucose 422, WBC 19.2, hemoglobin 10.6, platelets of 352.  Lactic acid is 1.4.  ED treatment: Ceftriaxone 2 g IV daily, 7 days ordered; Flagyl 500 mg IV every 12 hours 7 days ordered, vancomycin one-time dose ------------------------ At bedside, she was able to tell me her age, name, currently calendar 2024, and current location.  She reprots she noticed her right toes had increased bad odor and bleeding. She denies known bug bites, or blisters. She states that she probably didn't see her toes rubbing against each other. She endorses chills over the last week. She denies fever, nausea, vomiting, chest pain, shortness of breath, dysuria, diarrhea, .   Social history: She lives on her own. She denies tobacco, etoh, and recreational drug use. 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   ROS: Constitutional: no weight change, no fever ENT/Mouth: no sore throat, no rhinorrhea Eyes: no eye pain, no vision changes Cardiovascular: no chest pain, no dyspnea,  no edema, no palpitations Respiratory: no cough, no sputum, no  wheezing Gastrointestinal: no nausea, no vomiting, no diarrhea, no constipation Genitourinary: no urinary incontinence, no dysuria, no hematuria Musculoskeletal: no arthralgias, no myalgias Skin: + skin lesions, no pruritus, Neuro: + weakness, no loss of consciousness, no syncope Psych: no anxiety, no depression, + decrease appetite Heme/Lymph: no bruising, no bleeding  ED Course: Discussed with emergency medicine provider, patient requiring hospitalization for chief concerns of right foot osteomyelitis.  Assessment/Plan  Principal Problem:   Osteomyelitis (HCC) Active Problems:   Diabetes mellitus type 2, noninsulin dependent (HCC)   Essential hypertension   Leukocytosis   AKI (acute kidney injury) (HCC)   Sepsis with acute organ dysfunction (HCC)   Assessment and Plan:  * Osteomyelitis (HCC) - Cefepime and vancomycin per pharmacy - Continue with metronidazole per EDP - Check blood cultures x 2 given markedly elevated white cell count concerning for hematogenous spread - Admit to telemetry medical, inpatient  Sepsis with acute organ dysfunction (HCC) - Elevated lactic acid, leukocytosis, source of right foot, organ is renal involvement - Sodium chloride 2 L bolus and 1 L bolus ordered  - Sodium chloride 150 milliliters per hour, 1 day ordered - Continue broad-spectrum antibiotics - Follow third lactic acid, discussed with nursing via secure chat  AKI (acute kidney injury) (HCC) - Etiology workup in progress, differentials include prerenal secondary to poor p.o. intake versus sepsis with organ involvement  Leukocytosis Patient meets criteria for sepsis, leukocytosis, elevated lactic acid, source of diabetic foot infection - Given increased lactic acid and marked leukocytosis with source of infection of a diabetic foot wound, sepsis cannot be ruled out at this time - Presumed secondary to osteomyelitis -  Blood cultures x 2 and are in process - Will check a third lactic  acid - Continue cefepime, vancomycin, metronidazole 500 mg IV twice daily - Admit to telemetry medical, inpatient  Essential hypertension - Resumed atenolol 50 mg daily - Hydralazine 5 mg IV every 8 hours as needed for SBP greater than 175, 4 days ordered  Diabetes mellitus type 2, noninsulin dependent (HCC) - Home glipizide 5 mg and metformin daily were not resumed on admission - One-time dose of insulin aspart 15 units on admission for blood glucose level of 422 - Insulin SSI with at bedtime coverage ordered - Goal inpatient blood glucose level is 140-180  Chart reviewed.   DVT prophylaxis: TED hose; heparin 5000 units subcutaneous every 8 hours, 2 doses ordered; AM team to reinitiate pharmacologic DVT prophylaxis when the benefits outweigh the risk;  Code Status: Full code Diet: Heart healthy/carb modified diet; n.p.o. after midnight Family Communication: No Disposition Plan: Pending podiatry evaluation Consults called: Podiatry service Admission status: Telemetry medical, inpatient  Past Medical History:  Diagnosis Date   Diabetes (HCC)    Hypertension    History reviewed. No pertinent surgical history.  Social History:  reports that she has never smoked. She has never used smokeless tobacco. She reports that she does not drink alcohol and does not use drugs.  Allergies  Allergen Reactions   Tomato Flavor [Flavoring Agent] Swelling   History reviewed. No pertinent family history. Family history: Family history reviewed and not pertinent  Prior to Admission medications   Medication Sig Start Date End Date Taking? Authorizing Provider  atenolol (TENORMIN) 50 MG tablet Take 50 mg by mouth daily. 03/19/22  Yes [provider]  chlorthalidone (HYGROTON) 25 MG tablet Take 25 mg by mouth daily.   Yes [provider]  diphenhydrAMINE (BENADRYL) 25 MG tablet Take 0.5 tablets (12.5 mg total) by mouth every 6 (six) hours as needed for up to 5 days for itching.  03/03/22 03/19/22 Yes Triplett, Cari B, FNP  glipiZIDE (GLUCOTROL) 5 MG tablet Take by mouth. 03/19/22  Yes [provider]  metFORMIN (GLUCOPHAGE) 500 MG tablet Take 1,000 mg by mouth 2 (two) times daily.   Yes [provider]   Physical Exam: Vitals:   03/19/22 2012 03/19/22 2013 03/19/22 2015 03/19/22 2100  BP: 134/62 134/62  (!) 125/47  Pulse: 97  98 91  Resp: 18  20 18   Temp:      TempSrc:      SpO2: 100%  100% 96%  Weight:      Height:       Constitutional: appears age-appropriate, NAD, calm, comfortable Eyes: PERRL, lids and conjunctivae normal ENMT: Mucous membranes are moist. Posterior pharynx clear of any exudate or lesions. Age-appropriate dentition. Hearing appropriate Neck: normal, supple, no masses, no thyromegaly Respiratory: clear to auscultation bilaterally, no wheezing, no crackles. Normal respiratory effort. No accessory muscle use.  Cardiovascular: Regular rate and rhythm, no murmurs / rubs / gallops. No extremity edema. 2+ pedal pulses. No carotid bruits.  Abdomen: Obese abdomen, no tenderness, no masses palpated, no hepatosplenomegaly. Bowel sounds positive.  Musculoskeletal: no clubbing / cyanosis. No joint deformity upper and lower extremities. Good ROM, no contractures, no atrophy. Normal muscle tone.  Skin: no rashes, lesions, ulcers. No induration Neurologic: Sensation intact. Strength 5/5 in all 4.  Psychiatric: Normal judgment and insight. Alert and oriented x 3. Normal mood.   EKG: Not indicated at this time  X-ray on Admission: I personally reviewed and I agree  with radiologist reading as below.  MRI Right foot without contrast  Result Date: 03/19/2022 CLINICAL DATA:  Soft tissue infection suspected. Findings concerning for osteomyelitis of the distal fourth greater than fifth metatarsals and base of the fourth proximal phalanx on today's radiographs. EXAM: MRI OF THE RIGHT FOREFOOT WITHOUT CONTRAST TECHNIQUE: Multiplanar, multisequence MR  imaging of the right forefoot was performed. No intravenous contrast was administered. COMPARISON:  Right foot radiographs 03/19/2022 FINDINGS: Bones/Joint/Cartilage As seen on today's radiographs, there is erosion of the lateral greater than medial aspect of the fourth metatarsal head and the proximal aspect of the proximal phalanx of the fourth toe. There also appears to be high-grade erosion of the distal phalanx of the fourth toe. There is high-grade marrow edema throughout the majority of the proximal distal phalanges of the fourth toe. High-grade marrow edema throughout the distal 50% of the fourth metatarsal with mild reactive marrow edema within the proximal shaft of the fourth metatarsal. There is a moderate to high-grade marrow edema within the proximal 40% of the proximal phalanx of the fifth toe with mild cortical step-off and likely erosion of the plantar proximal aspect (sagittal series 10, image 4, coronal series 9 image 14). Although there appear to be mild erosion of the medial aspect of the fifth metatarsal head on today's radiographs, no definitive cortical erosion is seen within the fifth metatarsal head on the current MRI. There is fluid and apparent soft tissue ulceration within the plantar lateral aspect of the proximal fifth toe (coronal series 9 images 13 and 14 and axial series 6 images 04/03/2021). Mild marrow edema within the plantar and lateral aspect of the third metatarsal head without definite cortical erosion. Mild-to-moderate lateral great toe metatarsal head degenerative spurring. Moderate to severe third tarsometatarsal joint space narrowing and subchondral degenerative cystic change. Ligaments The Lisfranc ligament complex is intact. Muscles and Tendons There is moderate 4th flexor digitorum longus tenosynovitis in the midfoot through the fourth metatarsal, with higher grade lobular tenosynovitis at the level of the phalanges. Soft tissues There is lobular decreased T1 and  increased T2 signal fluid within the region of the fourth metatarsal head through the middle phalanx concerning for an abscess measuring up to approximately 2.0 x 2.4 x 3.6 cm in greatest transverse by short axis of the foot by long axis of the foot dimensions (axial image 18, sagittal image 8, coronal image 11). There is moderate edema and swelling of the dorsal medial midfoot and plantar medial midfoot subcutaneous fat. Moderate to high-grade subcutaneous fat edema and moderate swelling of the plantar lateral forefoot subcutaneous fat from the level of the distal metatarsal through the phalanges of the fourth digit. IMPRESSION: 1. There is high-grade marrow edema and cortical erosion indicating acute osteomyelitis of the fourth metatarsal head and fourth toe proximal distal phalanges. There is also high-grade marrow edema within the distal 50% of the fourth metatarsal with more mild marrow edema within the proximal shaft of the fourth metatarsal. This metatarsal shaft edema may be secondary to osteomyelitis or reactive. Findings concerning for moderate abscess surrounding the proximal phalanx and distal metatarsal of the fourth toe. 2. There is moderate to high-grade marrow edema within the proximal 40% of the proximal phalanx of the fifth toe with mild cortical step-off and likely erosion of the plantar proximal aspect. There is fluid and apparent soft tissue ulceration within the plantar lateral aspect of the proximal fifth toe. Findings are suspicious for acute osteomyelitis of the proximal phalanx of the fifth toe  with a small adjacent abscess and soft tissue ulceration. 3. There is a lobular fluid collection within the region of the fourth metatarsal head through the middle phalanx concerning for an abscess. 4. Mild marrow edema within the plantar and lateral aspect of the third metatarsal head without definite cortical erosion. This may represent noninfectious osteitis. 5. Moderate 4th flexor digitorum  longus likely septic tenosynovitis in the midfoot through the fourth metatarsal, with higher grade lobular likely septic tenosynovitis at the level of the phalanges of the fourth digit. 6. 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 Electronically Signed   By: Neita Garnet M.D.   On: 03/19/2022 18:19   MR ANKLE RIGHT WO CONTRAST  Result Date: 03/19/2022 CLINICAL DATA:  Soft tissue infection suspected. 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 Patient reports swelling, pain, and foul smell coming from right foot for a couple of weeks. First noticed a blister between third and fourth toes approximately 2 weeks ago that has gotten progressively more swollen and malodorous. 5 2 painful to walk. EXAM: MRI OF THE RIGHT ANKLE WITHOUT CONTRAST TECHNIQUE: Multiplanar, multisequence MR imaging of the ankle was performed. No intravenous contrast was administered. COMPARISON:  Right foot radiographs 03/19/2022 FINDINGS: Despite efforts by the technologist and patient, motion artifact is present on today's exam and could not be eliminated. This reduces exam sensitivity and specificity. TENDONS Peroneal: The peroneus longus and brevis tendons are intact. Posteromedial: The posterior tibial tendon is intact. Moderate flexor digitorum longus tenosynovitis at the level of the distal tibia. There is mild-to-moderate marrow edema within the adjacent posterior aspect of the medial malleolus (axial series 8 image 22, coronal series 7, image 23). Note is made of history of right great toe pharyngeal amputation, and the distal aspect of the flexor hallucis longus tendon is not visualized distal to the midfoot, consistent with the prior surgery. Anterior: The tibialis anterior, extensor hallucis longus, and extensor digitorum longus tendons are intact. Achilles: Intact. Plantar Fascia: Intact. LIGAMENTS Lateral: The anterior and posterior talofibular, anterior and  posterior tibiofibular, and calcaneofibular ligaments are intact. Medial: The tibiotalar deep deltoid and tibial spring ligaments are grossly intact, within limitations of motion artifact. CARTILAGE Ankle Joint: Mild-to-moderate anterior tibiotalar cartilage thinning. Subtalar Joints/Sinus Tarsi: Fat is preserved within sinus tarsi. Moderate posterior aspect of the posterior subtalar joint cartilage thinning with mild peripheral degenerative osteophytes. Bones: Moderate talonavicular and navicular-cuneiform cartilage thinning and dorsal osteophytosis. There is marrow edema within the partially visualized fourth metatarsal shaft. Other: There is decreased T1 and increased T2 signal fluid diffusely extending in between the posterior tibial, flexor digitorum longus, and the flexor hallucis longus muscles and tendons and the overlying medial and lateral gastrocnemius musculotendinous junctions at the proximal Achilles (axial images 1 through 25). This fluid extends off of the superior plane of view. The imaged portion of this fluid measures up to 3.8 cm in greatest transverse dimension, 2.1 cm in greatest AP dimension, and 12 cm in greatest craniocaudal length. This may connect to smaller regions of fluid seen medial to the posterior tibial and flexor digitorum longus tendons, within the subcutaneous fat of the medial ankle at the level of the distal tibia. There is also moderate edema and swelling of the lateral and posterolateral ankle subcutaneous fat. No definite soft tissue ulcer or marrow edema is visualized. The Lisfranc ligament complex is intact. There is mild-to-moderate tenosynovitis of the visualized flexor digitorum longus within the midfoot. IMPRESSION: 1.  Moderate flexor digitorum longus tenosynovitis at the level of the distal tibia. The possible presence of infection within this tendon sheath fluid cannot be determined by MRI. There is mild-to-moderate marrow edema within the adjacent posterior aspect  of the medial malleolus. This may be stress related/reactive. It is difficult to exclude early osteomyelitis. 2. Mild-to-moderate tenosynovitis of the visualized flexor digitorum longus within the midfoot. MRI cannot exclude infection within this tendon sheath fluid. 3. There is marrow edema within the fourth metatarsal shaft. Please see contemporaneous MRI of the forefoot report for further description of findings of fourth metatarsal osteomyelitis. 4. There is fluid diffusely extending in between the posterior tibial, flexor digitorum longus, and flexor hallucis longus muscles and tendons and the overlying medial and lateral gastrocnemius musculotendinous junctions at the proximal Achilles. This fluid extends off of the superior plane of view. This may represent a large seroma or abscess. This may connect to small regions of fluid seen medial to the posterior tibial and flexor digitorum longus, within the subcutaneous fat of the medial ankle at the level of the distal tibia. 5. Moderate edema and swelling of the lateral and posterolateral ankle subcutaneous fat. Electronically Signed   By: Yvonne Kendall M.D.   On: 03/19/2022 17:48   DG Foot Complete Right  Result Date: 03/19/2022 CLINICAL DATA:  Assess for possible right foot osteomyelitis. Localized pain. EXAM: RIGHT FOOT COMPLETE - 3+ VIEW COMPARISON:  None. FINDINGS: Bony resorption with associated osteopenia of the distal fourth and fifth metatarsals, including the metatarsal heads, more severely of the fourth and the fifth. Suspected resorption at the base of the proximal phalanx of the fourth toe, possibly also of the fifth, assessment limited by positioning. No other evidence of osteomyelitis. Previous amputation of the great toe. Joints are normally aligned. Soft tissue swelling of the forefoot, and most prominently of the fourth toe. IMPRESSION: 1. Positive for osteomyelitis. There is osteomyelitis of the distal fourth and fifth metatarsals, more  severely of the fourth, and of the base of the fourth toe proximal phalanx, possibly of the base of the fifth toe proximal phalanx. Electronically Signed   By: Lajean Manes M.D.   On: 03/19/2022 14:58    Labs on Admission: I have personally reviewed following labs CBC: Recent Labs  Lab 03/19/22 1246  WBC 19.2*  NEUTROABS 16.1*  HGB 10.6*  HCT 33.3*  MCV 83.0  PLT 409   Basic Metabolic Panel: Recent Labs  Lab 03/19/22 1246  NA 124*  K 5.0  CL 88*  CO2 24  GLUCOSE 422*  BUN 28*  CREATININE 1.36*  CALCIUM 8.5*   GFR: Estimated Creatinine Clearance: 42.4 mL/min (A) (by C-G formula based on SCr of 1.36 mg/dL (H)).  CRITICAL CARE Performed by: Dr. Tobie Poet  Total critical care time: 30 minutes  Critical care time was exclusive of separately billable procedures and treating other patients.  Critical care was necessary to treat or prevent imminent or life-threatening deterioration.  Critical care was time spent personally by me on the following activities: development of treatment plan with patient and/or surrogate as well as nursing, discussions with consultants, evaluation of patient's response to treatment, examination of patient, obtaining history from patient or surrogate, ordering and performing treatments and interventions, ordering and review of laboratory studies, ordering and review of radiographic studies, pulse oximetry and re-evaluation of patient's condition.  This document was prepared using Dragon Voice Recognition software and may include unintentional dictation errors.  Dr. Tobie Poet [facility]  If 7PM-7AM, please  contact overnight-coverage provider If 7AM-7PM, please contact day coverage provider [email or web address]  03/19/2022, 9:53 PM

## 2022-03-19 NOTE — ED Triage Notes (Signed)
Sent to ED for admission for osteo myelitis.  Per handoff, patient needs IV antibiotics and a MRI.  AAOx3.  Skin warm and dry. NAD

## 2022-03-19 NOTE — Consult Note (Signed)
PHARMACY -  BRIEF ANTIBIOTIC NOTE   Pharmacy has received consult(s) for Vancomycin from an ED provider.  The patient's profile has been reviewed for ht/wt/allergies/indication/available labs.    One time order(s) placed for Vancomycin 2g IV x 1 dose.  Further antibiotics/pharmacy consults should be ordered by admitting physician if indicated.                       Thank you, Pearla Dubonnet 03/19/2022  2:39 PM

## 2022-03-20 DIAGNOSIS — M869 Osteomyelitis, unspecified: Secondary | ICD-10-CM | POA: Diagnosis not present

## 2022-03-20 LAB — HEMOGLOBIN A1C
Hgb A1c MFr Bld: 11.9 % — ABNORMAL HIGH (ref 4.8–5.6)
Mean Plasma Glucose: 294.83 mg/dL

## 2022-03-20 LAB — GLUCOSE, CAPILLARY
Glucose-Capillary: 147 mg/dL — ABNORMAL HIGH (ref 70–99)
Glucose-Capillary: 247 mg/dL — ABNORMAL HIGH (ref 70–99)
Glucose-Capillary: 250 mg/dL — ABNORMAL HIGH (ref 70–99)
Glucose-Capillary: 286 mg/dL — ABNORMAL HIGH (ref 70–99)

## 2022-03-20 LAB — BASIC METABOLIC PANEL
Anion gap: 6 (ref 5–15)
BUN: 21 mg/dL (ref 8–23)
CO2: 22 mmol/L (ref 22–32)
Calcium: 7.6 mg/dL — ABNORMAL LOW (ref 8.9–10.3)
Chloride: 104 mmol/L (ref 98–111)
Creatinine, Ser: 0.91 mg/dL (ref 0.44–1.00)
GFR, Estimated: >60 mL/min (ref >60)
Glucose, Bld: 140 mg/dL — ABNORMAL HIGH (ref 70–99)
Potassium: 3.8 mmol/L (ref 3.5–5.1)
Sodium: 132 mmol/L — ABNORMAL LOW (ref 135–145)

## 2022-03-20 LAB — LACTIC ACID, PLASMA: Lactic Acid, Venous: 0.8 mmol/L (ref 0.5–1.9)

## 2022-03-20 LAB — CBC
HCT: 26.4 % — ABNORMAL LOW (ref 36.0–46.0)
Hemoglobin: 8.6 g/dL — ABNORMAL LOW (ref 12.0–15.0)
MCH: 26.9 pg (ref 26.0–34.0)
MCHC: 32.6 g/dL (ref 30.0–36.0)
MCV: 82.5 fL (ref 80.0–100.0)
Platelets: 283 10*3/uL (ref 150–400)
RBC: 3.2 MIL/uL — ABNORMAL LOW (ref 3.87–5.11)
RDW: 14.5 % (ref 11.5–15.5)
WBC: 14.3 10*3/uL — ABNORMAL HIGH (ref 4.0–10.5)
nRBC: 0 % (ref 0.0–0.2)

## 2022-03-20 NOTE — Consult Note (Signed)
Hospital Consult    Reason for Consult:  Osteomyelitis with extensive infection and Abscess.  Requesting Physician:  Dr Sharlotte Alamo MD MRN #:  [id number]  History of Present Illness: This is a 77 y.o. 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    Upon evaluation this morning the patient was found resting comfortably in bed.  No complaints overnight vitals all remained stable.  She continues on cefepime and vancomycin per pharmacy.  Patient's son was at the bedside with her this morning.  Past Medical History:  Diagnosis Date   Diabetes ([place])    Hypertension     History reviewed. No pertinent surgical history.  Allergies  Allergen Reactions   Tomato Flavor [Flavoring Agent] Swelling    Prior to Admission medications   Medication Sig Start Date End Date Taking? Authorizing Provider  atenolol (TENORMIN) 50 MG tablet Take 50 mg by mouth daily. 03/19/22  Yes [provider]  chlorthalidone (HYGROTON) 25 MG tablet Take 25 mg by mouth daily.   Yes [provider]  diphenhydrAMINE (BENADRYL) 25 MG tablet Take 0.5 tablets (12.5 mg total) by mouth every 6 (six) hours as needed for up to 5 days for itching. 03/03/22 03/19/22 Yes Triplett, Cari B, FNP  glipiZIDE (GLUCOTROL) 5 MG tablet Take by mouth. 03/19/22  Yes [provider]  metFORMIN (GLUCOPHAGE) 500 MG tablet Take 1,000 mg by mouth 2 (two) times daily.   Yes [provider]    Social History   Socioeconomic History   Marital status: Divorced    Spouse name: Not on file   Number of children: Not on file   Years of education: Not on file   Highest education level: Not on file  Occupational History   Not on file  Tobacco Use   Smoking status: Never    Smokeless tobacco: Never  Substance and Sexual Activity   Alcohol use: Never   Drug use: Never   Sexual activity: Not Currently  Other Topics Concern   Not on file  Social History Narrative   Not on file   Social Determinants of Health   Financial Resource Strain: Not on file  Food Insecurity: No Food Insecurity (03/20/2022)   Hunger Vital Sign    Worried About Running Out of Food in the Last Year: Never true    Ran Out of Food in the Last Year: Never true  Transportation Needs: No Transportation Needs (03/20/2022)   PRAPARE - Hydrologist (Medical): No    Lack of Transportation (Non-Medical): No  Physical Activity: Not on file  Stress: Not on file  Social Connections: Not on file  Intimate Partner Violence: Not At Risk (03/20/2022)   Humiliation, Afraid, Rape, and Kick questionnaire    Fear of Current or Ex-Partner: No    Emotionally Abused: No    Physically Abused: No    Sexually Abused: No     History reviewed. No pertinent family history.  ROS: Otherwise negative unless mentioned in HPI  Physical Examination  Vitals:   03/20/22 0300 03/20/22 0453  BP: (!) 130/57 (!) 144/67  Pulse: 90 93  Resp: 16 15  Temp: 98.1 F (36.7 C) 99.6 F (37.6 C)  SpO2: 99% 98%   Body mass index is 30.86 kg/m.  General:  WDWN in NAD Gait: Not observed HENT: WNL, normocephalic Pulmonary: normal non-labored breathing, without Rales, rhonchi,  wheezing Cardiac: regular, without  Murmurs, rubs or gallops; without carotid bruits Abdomen: Positive bowel sounds,  soft, NT/ND, no masses Skin: without rashes Vascular Exam/Pulses: Right lower extremity is warm to touch with +2 edema. Ankle and foot are wrapped. Unable to palpate PT pulse.  Extremities: with ischemic changes, with Gangrene , with cellulitis; with open wounds;  Musculoskeletal: no muscle wasting or atrophy  Neurologic: A&O X 3;  No focal weakness or paresthesias are detected; speech is  fluent/normal Psychiatric:  The pt has Normal affect. Lymph:  Unremarkable  CBC    Component Value Date/Time   WBC 19.2 (H) 03/19/2022 1246   RBC 4.01 03/19/2022 1246   HGB 10.6 (L) 03/19/2022 1246   HCT 33.3 (L) 03/19/2022 1246   PLT 352 03/19/2022 1246   MCV 83.0 03/19/2022 1246   MCH 26.4 03/19/2022 1246   MCHC 31.8 03/19/2022 1246   RDW 14.2 03/19/2022 1246   LYMPHSABS 1.3 03/19/2022 1246   MONOABS 1.4 (H) 03/19/2022 1246   EOSABS 0.1 03/19/2022 1246   BASOSABS 0.1 03/19/2022 1246    BMET    Component Value Date/Time   NA 124 (L) 03/19/2022 1246   K 5.0 03/19/2022 1246   CL 88 (L) 03/19/2022 1246   CO2 24 03/19/2022 1246   GLUCOSE 422 (H) 03/19/2022 1246   BUN 28 (H) 03/19/2022 1246   CREATININE 1.36 (H) 03/19/2022 1246   CALCIUM 8.5 (L) 03/19/2022 1246   GFRNONAA 41 (L) 03/19/2022 1246    COAGS: No results found for: "INR", "PROTIME"   Non-Invasive Vascular Imaging:   EXAM: MRI OF THE RIGHT ANKLE WITHOUT CONTRAST  IMPRESSION: 1. Moderate flexor digitorum longus tenosynovitis at the level of the distal tibia. The possible presence of infection within this tendon sheath fluid cannot be determined by MRI. There is mild-to-moderate marrow edema within the adjacent posterior aspect of the medial malleolus. This may be stress related/reactive. It is difficult to exclude early osteomyelitis. 2. Mild-to-moderate tenosynovitis of the visualized flexor digitorum longus within the midfoot. MRI cannot exclude infection within this tendon sheath fluid. 3. There is marrow edema within the fourth metatarsal shaft. Please see contemporaneous MRI of the forefoot report for further description of findings of fourth metatarsal osteomyelitis. 4. There is fluid diffusely extending in between the posterior tibial, flexor digitorum longus, and flexor hallucis longus muscles and tendons and the overlying medial and lateral gastrocnemius musculotendinous junctions at the  proximal Achilles. This fluid extends off of the superior plane of view. This may represent a large seroma or abscess. This may connect to small regions of fluid seen medial to the posterior tibial and flexor digitorum longus, within the subcutaneous fat of the medial ankle at the level of the distal tibia. 5. Moderate edema and swelling of the lateral and posterolateral ankle subcutaneous fat.  EXAM: MRI OF THE RIGHT FOREFOOT WITHOUT CONTRAST  IMPRESSION: 1. There is high-grade marrow edema and cortical erosion indicating acute osteomyelitis of the fourth metatarsal head and fourth toe proximal distal phalanges. There is also high-grade marrow edema within the distal 50% of the fourth metatarsal with more mild marrow edema within the proximal shaft of the fourth metatarsal. This metatarsal shaft edema may be secondary to osteomyelitis or reactive. Findings concerning for moderate abscess surrounding the proximal phalanx and distal metatarsal of the fourth toe. 2. There is moderate to high-grade marrow edema within the proximal  40% of the proximal phalanx of the fifth toe with mild cortical step-off and likely erosion of the plantar proximal aspect. There is fluid and apparent soft tissue ulceration within the plantar lateral aspect of the proximal fifth toe. Findings are suspicious for acute osteomyelitis of the proximal phalanx of the fifth toe with a small adjacent abscess and soft tissue ulceration. 3. There is a lobular fluid collection within the region of the fourth metatarsal head through the middle phalanx concerning for an abscess. 4. Mild marrow edema within the plantar and lateral aspect of the third metatarsal head without definite cortical erosion. This may represent noninfectious osteitis. 5. Moderate 4th flexor digitorum longus likely septic tenosynovitis in the midfoot through the fourth metatarsal, with higher grade lobular likely septic tenosynovitis at the  level of the phalanges of the fourth digit. 6. 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  Statin:  No. Beta Blocker:  Yes.   Aspirin:  No. ACEI:  No. ARB:  No. CCB use:  No Other antiplatelets/anticoagulants:  No.    ASSESSMENT/PLAN: This is a 77 y.o. 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    PLAN:   Right lower extremity angiogram tomorrow to 09/01/2022.  I discussed with both the patient and the son this morning the procedure, benefits, risks, and complications.  They were told that the angiogram will be used to determine what type of amputation of the right lower extremity will be needed.  At minimum it would be metatarsal removal.  At maximum it would allow [place] to evaluate blood flow for either BKA or AKA.  Determination of what level of amputation would be determined after findings from right lower extremity angiogram.  The patient and the son would like to proceed as soon as possible.  Will be made n.p.o. after midnight tonight.   -Current plan was discussed with Dr. [name] MD and he is in agreement with the plan.   Drema Pry [facility] 03/20/2022 9:12 AM

## 2022-03-20 NOTE — TOC CM/SW Note (Signed)
CSW acknowledges consult for home health/DME needs. Per podiatry note, plan for vascular consult with potential BKA. CSW will follow progress. Please consult PT and OT when appropriate to determine needs.  Dayton Scrape, [place]

## 2022-03-20 NOTE — ED Notes (Signed)
ED TO INPATIENT HANDOFF REPORT  ED Nurse Name and Phone #:   S Name/Age/Gender Melanie Juarez 77 y.o. female Room/Bed: ED32A/ED32A  Code Status   Code Status: Full Code  Home/SNF/Other Home Patient oriented to: self, place, time, and situation Is this baseline? Yes   Triage Complete: Triage complete  Chief Complaint Osteomyelitis [facility]) [M86.9]  Triage Note Sent to ED for admission for osteo myelitis.  Per handoff, patient needs IV antibiotics and a MRI.  AAOx3.  Skin warm and dry. 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   Allergies Allergies  Allergen Reactions   Tomato Flavor [Flavoring Agent] Swelling    Level of Care/Admitting Diagnosis ED Disposition     ED Disposition  Admit   Condition  --   [place]: [place] [[id number]]  Level of Care: Telemetry Medical [104]  Covid Evaluation: Asymptomatic - no recent exposure (last 10 days) testing not required  Diagnosis: Osteomyelitis [facility]) [[id number]]  Admitting Physician: Criss Alvine [[id number]]  Attending Physician: COX, AMY N [[id number]]  Certification:: I certify this patient will need inpatient services for at least 2 midnights  Estimated Length of Stay: 3          B Medical/Surgery History Past Medical History:  Diagnosis Date   Diabetes ([place])    Hypertension    History reviewed. No pertinent surgical history.   A IV Location/Drains/Wounds Patient Lines/Drains/Airways Status     Active Line/Drains/Airways     Name Placement date Placement time Site Days   Peripheral IV 03/20/22 20 G Right Forearm 03/20/22  0247  Forearm  less than 1   External Urinary Catheter 03/19/22  2002  --  1   Wound / Incision (Open or Dehisced) 03/19/22 Ankle  Right amputated toes to right foot, weeping and drainage from right foot 03/19/22  2158  Ankle  1            Intake/Output Last 24 hours No intake or output data in the 24 hours ending 03/20/22 2956  Labs/Imaging Results for orders placed or performed during the hospital encounter of 03/19/22 (from the past 48 hour(s))  Lactic acid, plasma     Status: None   Collection Time: 03/19/22 12:46 PM  Result Value Ref Range   Lactic Acid, Venous 1.4 0.5 - 1.9 mmol/L    Comment: Performed at [facility], [address] [place], [place] [address]  CBC with Differential     Status: Abnormal   Collection Time: 03/19/22 12:46 PM  Result Value Ref Range   WBC 19.2 (H) 4.0 - 10.5 K/uL   RBC 4.01 3.87 - 5.11 MIL/uL   Hemoglobin 10.6 (L) 12.0 - 15.0 g/dL   HCT 33.3 (L) 36.0 - 46.0 %   MCV 83.0 80.0 - 100.0 fL   MCH 26.4 26.0 - 34.0 pg   MCHC 31.8 30.0 - 36.0 g/dL   RDW 14.2 11.5 - 15.5 %   Platelets 352 150 - 400 K/uL   nRBC 0.0 0.0 - 0.2 %   Neutrophils Relative % 83 %   Neutro Abs 16.1 (H) 1.7 - 7.7 K/uL   Lymphocytes Relative 7 %   Lymphs Abs 1.3 0.7 - 4.0 K/uL   Monocytes Relative 7 %  Monocytes Absolute 1.4 (H) 0.1 - 1.0 K/uL   Eosinophils Relative 1 %   Eosinophils Absolute 0.1 0.0 - 0.5 K/uL   Basophils Relative 0 %   Basophils Absolute 0.1 0.0 - 0.1 K/uL   Immature Granulocytes 2 %   Abs Immature Granulocytes 0.34 (H) 0.00 - 0.07 K/uL    Comment: Performed at [facility], [address], [place], [place] [address]  Basic metabolic panel     Status: Abnormal   Collection Time: 03/19/22 12:46 PM  Result Value Ref Range   Sodium 124 (L) 135 - 145 mmol/L   Potassium 5.0 3.5 - 5.1 mmol/L   Chloride 88 (L) 98 - 111 mmol/L   CO2 24 22 - 32 mmol/L   Glucose, Bld 422 (H) 70 - 99 mg/dL    Comment: Glucose reference range applies only to samples taken after fasting for at least 8 hours.   BUN 28 (H) 8 - 23 mg/dL   Creatinine, Ser 1.36 (H) 0.44 - 1.00 mg/dL    Calcium 8.5 (L) 8.9 - 10.3 mg/dL   GFR, Estimated 41 (L) >60 mL/min    Comment: (NOTE) Calculated using the CKD-EPI Creatinine Equation (2021)    Anion gap 12 5 - 15    Comment: Performed at [facility], [address] [place], [place] [address]  CBG monitoring, ED     Status: Abnormal   Collection Time: 03/19/22  3:22 PM  Result Value Ref Range   Glucose-Capillary 417 (H) 70 - 99 mg/dL    Comment: Glucose reference range applies only to samples taken after fasting for at least 8 hours.  CBG monitoring, ED     Status: Abnormal   Collection Time: 03/19/22  4:06 PM  Result Value Ref Range   Glucose-Capillary 384 (H) 70 - 99 mg/dL    Comment: Glucose reference range applies only to samples taken after fasting for at least 8 hours.  Lactic acid, plasma     Status: Abnormal   Collection Time: 03/19/22  6:11 PM  Result Value Ref Range   Lactic Acid, Venous 2.6 (HH) 0.5 - 1.9 mmol/L    Comment: CRITICAL RESULT CALLED TO, READ BACK BY AND VERIFIED WITH BETH Letica Giaimo @1901  ON 03/19/22 SKL Performed at [facility], [address] [place], [place] [address]   CBG monitoring, ED     Status: Abnormal   Collection Time: 03/19/22  6:13 PM  Result Value Ref Range   Glucose-Capillary 203 (H) 70 - 99 mg/dL    Comment: Glucose reference range applies only to samples taken after fasting for at least 8 hours.  Lactic acid, plasma     Status: None   Collection Time: 03/19/22 10:01 PM  Result Value Ref Range   Lactic Acid, Venous 1.3 0.5 - 1.9 mmol/L    Comment: Performed at [facility], [address] [place], [place] [address]  CBG monitoring, ED     Status: Abnormal   Collection Time: 03/19/22 10:11 PM  Result Value Ref Range   Glucose-Capillary 139 (H) 70 - 99 mg/dL    Comment: Glucose reference range applies only to samples taken after fasting for at least 8 hours.   MRI Right foot without contrast  Result Date: 03/19/2022 CLINICAL DATA:  Soft tissue  infection suspected. Findings concerning for osteomyelitis of the distal fourth greater than fifth metatarsals and base of the fourth proximal phalanx on today's radiographs. EXAM: MRI OF THE RIGHT FOREFOOT WITHOUT CONTRAST TECHNIQUE: Multiplanar, multisequence  MR imaging of the right forefoot was performed. No intravenous contrast was administered. COMPARISON:  Right foot radiographs 03/19/2022 FINDINGS: Bones/Joint/Cartilage As seen on today's radiographs, there is erosion of the lateral greater than medial aspect of the fourth metatarsal head and the proximal aspect of the proximal phalanx of the fourth toe. There also appears to be high-grade erosion of the distal phalanx of the fourth toe. There is high-grade marrow edema throughout the majority of the proximal distal phalanges of the fourth toe. High-grade marrow edema throughout the distal 50% of the fourth metatarsal with mild reactive marrow edema within the proximal shaft of the fourth metatarsal. There is a moderate to high-grade marrow edema within the proximal 40% of the proximal phalanx of the fifth toe with mild cortical step-off and likely erosion of the plantar proximal aspect (sagittal series 10, image 4, coronal series 9 image 14). Although there appear to be mild erosion of the medial aspect of the fifth metatarsal head on today's radiographs, no definitive cortical erosion is seen within the fifth metatarsal head on the current MRI. There is fluid and apparent soft tissue ulceration within the plantar lateral aspect of the proximal fifth toe (coronal series 9 images 13 and 14 and axial series 6 images 04/03/2021). Mild marrow edema within the plantar and lateral aspect of the third metatarsal head without definite cortical erosion. Mild-to-moderate lateral great toe metatarsal head degenerative spurring. Moderate to severe third tarsometatarsal joint space narrowing and subchondral degenerative cystic change. Ligaments The Lisfranc ligament  complex is intact. Muscles and Tendons There is moderate 4th flexor digitorum longus tenosynovitis in the midfoot through the fourth metatarsal, with higher grade lobular tenosynovitis at the level of the phalanges. Soft tissues There is lobular decreased T1 and increased T2 signal fluid within the region of the fourth metatarsal head through the middle phalanx concerning for an abscess measuring up to approximately 2.0 x 2.4 x 3.6 cm in greatest transverse by short axis of the foot by long axis of the foot dimensions (axial image 18, sagittal image 8, coronal image 11). There is moderate edema and swelling of the dorsal medial midfoot and plantar medial midfoot subcutaneous fat. Moderate to high-grade subcutaneous fat edema and moderate swelling of the plantar lateral forefoot subcutaneous fat from the level of the distal metatarsal through the phalanges of the fourth digit. IMPRESSION: 1. There is high-grade marrow edema and cortical erosion indicating acute osteomyelitis of the fourth metatarsal head and fourth toe proximal distal phalanges. There is also high-grade marrow edema within the distal 50% of the fourth metatarsal with more mild marrow edema within the proximal shaft of the fourth metatarsal. This metatarsal shaft edema may be secondary to osteomyelitis or reactive. Findings concerning for moderate abscess surrounding the proximal phalanx and distal metatarsal of the fourth toe. 2. There is moderate to high-grade marrow edema within the proximal 40% of the proximal phalanx of the fifth toe with mild cortical step-off and likely erosion of the plantar proximal aspect. There is fluid and apparent soft tissue ulceration within the plantar lateral aspect of the proximal fifth toe. Findings are suspicious for acute osteomyelitis of the proximal phalanx of the fifth toe with a small adjacent abscess and soft tissue ulceration. 3. There is a lobular fluid collection within the region of the fourth metatarsal  head through the middle phalanx concerning for an abscess. 4. Mild marrow edema within the plantar and lateral aspect of the third metatarsal head without definite cortical erosion. This may represent noninfectious  osteitis. 5. Moderate 4th flexor digitorum longus likely septic tenosynovitis in the midfoot through the fourth metatarsal, with higher grade lobular likely septic tenosynovitis at the level of the phalanges of the fourth digit. 6. 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 Electronically Signed   By: Yvonne Kendall M.D.   On: 03/19/2022 18:19   MR ANKLE RIGHT WO CONTRAST  Result Date: 03/19/2022 CLINICAL DATA:  Soft tissue infection suspected. 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 Patient reports swelling, pain, and foul smell coming from right foot for a couple of weeks. First noticed a blister between third and fourth toes approximately 2 weeks ago that has gotten progressively more swollen and malodorous. 5 2 painful to walk. EXAM: MRI OF THE RIGHT ANKLE WITHOUT CONTRAST TECHNIQUE: Multiplanar, multisequence MR imaging of the ankle was performed. No intravenous contrast was administered. COMPARISON:  Right foot radiographs 03/19/2022 FINDINGS: Despite efforts by the technologist and patient, motion artifact is present on today's exam and could not be eliminated. This reduces exam sensitivity and specificity. TENDONS Peroneal: The peroneus longus and brevis tendons are intact. Posteromedial: The posterior tibial tendon is intact. Moderate flexor digitorum longus tenosynovitis at the level of the distal tibia. There is mild-to-moderate marrow edema within the adjacent posterior aspect of the medial malleolus (axial series 8 image 22, coronal series 7, image 23). Note is made of history of right great toe pharyngeal amputation, and the distal aspect of the flexor hallucis longus tendon is not visualized distal to the midfoot,  consistent with the prior surgery. Anterior: The tibialis anterior, extensor hallucis longus, and extensor digitorum longus tendons are intact. Achilles: Intact. Plantar Fascia: Intact. LIGAMENTS Lateral: The anterior and posterior talofibular, anterior and posterior tibiofibular, and calcaneofibular ligaments are intact. Medial: The tibiotalar deep deltoid and tibial spring ligaments are grossly intact, within limitations of motion artifact. CARTILAGE Ankle Joint: Mild-to-moderate anterior tibiotalar cartilage thinning. Subtalar Joints/Sinus Tarsi: Fat is preserved within sinus tarsi. Moderate posterior aspect of the posterior subtalar joint cartilage thinning with mild peripheral degenerative osteophytes. Bones: Moderate talonavicular and navicular-cuneiform cartilage thinning and dorsal osteophytosis. There is marrow edema within the partially visualized fourth metatarsal shaft. Other: There is decreased T1 and increased T2 signal fluid diffusely extending in between the posterior tibial, flexor digitorum longus, and the flexor hallucis longus muscles and tendons and the overlying medial and lateral gastrocnemius musculotendinous junctions at the proximal Achilles (axial images 1 through 25). This fluid extends off of the superior plane of view. The imaged portion of this fluid measures up to 3.8 cm in greatest transverse dimension, 2.1 cm in greatest AP dimension, and 12 cm in greatest craniocaudal length. This may connect to smaller regions of fluid seen medial to the posterior tibial and flexor digitorum longus tendons, within the subcutaneous fat of the medial ankle at the level of the distal tibia. There is also moderate edema and swelling of the lateral and posterolateral ankle subcutaneous fat. No definite soft tissue ulcer or marrow edema is visualized. The Lisfranc ligament complex is intact. There is mild-to-moderate tenosynovitis of the visualized flexor digitorum longus within the midfoot. IMPRESSION:  1. Moderate flexor digitorum longus tenosynovitis at the level of the distal tibia. The possible presence of infection within this tendon sheath fluid cannot be determined by MRI. There is mild-to-moderate marrow edema within the adjacent posterior aspect of the medial malleolus. This may be stress related/reactive. It is difficult to exclude early osteomyelitis. 2. Mild-to-moderate  tenosynovitis of the visualized flexor digitorum longus within the midfoot. MRI cannot exclude infection within this tendon sheath fluid. 3. There is marrow edema within the fourth metatarsal shaft. Please see contemporaneous MRI of the forefoot report for further description of findings of fourth metatarsal osteomyelitis. 4. There is fluid diffusely extending in between the posterior tibial, flexor digitorum longus, and flexor hallucis longus muscles and tendons and the overlying medial and lateral gastrocnemius musculotendinous junctions at the proximal Achilles. This fluid extends off of the superior plane of view. This may represent a large seroma or abscess. This may connect to small regions of fluid seen medial to the posterior tibial and flexor digitorum longus, within the subcutaneous fat of the medial ankle at the level of the distal tibia. 5. Moderate edema and swelling of the lateral and posterolateral ankle subcutaneous fat. Electronically Signed   By: Yvonne Kendall M.D.   On: 03/19/2022 17:48   DG Foot Complete Right  Result Date: 03/19/2022 CLINICAL DATA:  Assess for possible right foot osteomyelitis. Localized pain. EXAM: RIGHT FOOT COMPLETE - 3+ VIEW COMPARISON:  None. FINDINGS: Bony resorption with associated osteopenia of the distal fourth and fifth metatarsals, including the metatarsal heads, more severely of the fourth and the fifth. Suspected resorption at the base of the proximal phalanx of the fourth toe, possibly also of the fifth, assessment limited by positioning. No other evidence of osteomyelitis. Previous  amputation of the great toe. Joints are normally aligned. Soft tissue swelling of the forefoot, and most prominently of the fourth toe. IMPRESSION: 1. Positive for osteomyelitis. There is osteomyelitis of the distal fourth and fifth metatarsals, more severely of the fourth, and of the base of the fourth toe proximal phalanx, possibly of the base of the fifth toe proximal phalanx. Electronically Signed   By: Lajean Manes M.D.   On: 03/19/2022 14:58    Pending Labs Unresulted Labs (From admission, onward)     Start     Ordered   03/20/22 5956  Basic metabolic panel  Tomorrow morning,   STAT        03/19/22 1448   03/20/22 0500  CBC  Tomorrow morning,   STAT        03/19/22 1448   03/20/22 0500  Hemoglobin A1c  Tomorrow morning,   R       Comments: To assess prior glycemic control    03/19/22 1449   03/19/22 2111  Lactic acid, plasma  STAT Now then every 3 hours,   R      03/19/22 1912   03/19/22 1449  Culture, blood (Routine X 2) w Reflex to ID Panel  BLOOD CULTURE X 2,   STAT      03/19/22 1448            Vitals/Pain Today's Vitals   03/19/22 2100 03/20/22 0131 03/20/22 0200 03/20/22 0300  BP: (!) 125/47 114/64 (!) 126/55 (!) 130/57  Pulse: 91 82 86 90  Resp: 18 16 17 16   Temp:  98.3 F (36.8 C)  98.1 F (36.7 C)  TempSrc:  Oral  Oral  SpO2: 96% 97% 96% 99%  Weight:      Height:      PainSc:  0-No pain      Isolation Precautions No active isolations  Medications Medications  metroNIDAZOLE (FLAGYL) IVPB 500 mg (500 mg Intravenous New Bag/Given 03/20/22 0247)  acetaminophen (TYLENOL) tablet 650 mg (has no administration in time range)    Or  acetaminophen (TYLENOL) suppository  650 mg (has no administration in time range)  ondansetron (ZOFRAN) tablet 4 mg (has no administration in time range)    Or  ondansetron (ZOFRAN) injection 4 mg (has no administration in time range)  heparin injection 5,000 Units (5,000 Units Subcutaneous Given 03/19/22 2218)  insulin aspart  (novoLOG) injection 0-9 Units (3 Units Subcutaneous Given 03/19/22 1918)  insulin aspart (novoLOG) injection 0-5 Units ( Subcutaneous Not Given 03/19/22 2211)  hydrALAZINE (APRESOLINE) injection 5 mg (has no administration in time range)  ceFEPIme (MAXIPIME) 2 g in sodium chloride 0.9 % 100 mL IVPB (0 g Intravenous Stopped 03/19/22 1604)  oxyCODONE-acetaminophen (PERCOCET/ROXICET) 5-325 MG per tablet 1 tablet (has no administration in time range)  morphine (PF) 4 MG/ML injection 4 mg (has no administration in time range)  vancomycin (VANCOCIN) IVPB 1000 mg/200 mL premix (has no administration in time range)  atenolol (TENORMIN) tablet 50 mg (50 mg Oral Given 03/19/22 2218)  0.9 %  sodium chloride infusion ( Intravenous New Bag/Given 03/19/22 2207)  vancomycin (VANCOREADY) IVPB 2000 mg/400 mL (0 mg Intravenous Stopped 03/19/22 2155)  insulin aspart (novoLOG) injection 15 Units (15 Units Subcutaneous Given 03/19/22 1522)  sodium chloride 0.9 % bolus 2,000 mL (0 mLs Intravenous Stopped 03/20/22 0132)  sodium chloride 0.9 % bolus 1,000 mL (0 mLs Intravenous Stopped 03/20/22 0132)    Mobility walks with device     Focused Assessments    R Recommendations: See Admitting Provider Note  Report given to:   Additional Notes:

## 2022-03-20 NOTE — Progress Notes (Signed)
Subjective/Chief Complaint: Patient seen.  States the pain in her leg is somewhat better today.   Objective: Vital signs in last 24 hours: Temp:  [98.1 F (36.7 C)-99.6 F (37.6 C)] 99.6 F (37.6 C) (02/06 0453) Pulse Rate:  [82-98] 93 (02/06 0453) Resp:  [14-20] 15 (02/06 0453) BP: (114-144)/(47-67) 144/67 (02/06 0453) SpO2:  [96 %-100 %] 98 % (02/06 0453) Weight:  [90.7 kg] 90.7 kg (02/05 1446) Last BM Date : 03/18/22  Intake/Output from previous day: 02/05 0701 - 02/06 0700 In: 3420.6 [I.V.:1035; IV Piggyback:2385.6] Out: -  Intake/Output this shift: No intake/output data recorded.  Foot was not assessed today.  MRI images and reports were reviewed.  Significant infection extending up in to the proximal leg.  Lab Results:  Recent Labs    03/19/22 1246  WBC 19.2*  HGB 10.6*  HCT 33.3*  PLT 352   BMET Recent Labs    03/19/22 1246  NA 124*  K 5.0  CL 88*  CO2 24  GLUCOSE 422*  BUN 28*  CREATININE 1.36*  CALCIUM 8.5*   PT/INR No results for input(s): "LABPROT", "INR" in the last 72 hours. ABG No results for input(s): "PHART", "HCO3" in the last 72 hours.  Invalid input(s): "PCO2", "PO2"  Studies/Results: MRI Right foot without contrast  Result Date: 03/19/2022 CLINICAL DATA:  Soft tissue infection suspected. Findings concerning for osteomyelitis of the distal fourth greater than fifth metatarsals and base of the fourth proximal phalanx on today's radiographs. EXAM: MRI OF THE RIGHT FOREFOOT WITHOUT CONTRAST TECHNIQUE: Multiplanar, multisequence MR imaging of the right forefoot was performed. No intravenous contrast was administered. COMPARISON:  Right foot radiographs 03/19/2022 FINDINGS: Bones/Joint/Cartilage As seen on today's radiographs, there is erosion of the lateral greater than medial aspect of the fourth metatarsal head and the proximal aspect of the proximal phalanx of the fourth toe. There also appears to be high-grade erosion of the distal  phalanx of the fourth toe. There is high-grade marrow edema throughout the majority of the proximal distal phalanges of the fourth toe. High-grade marrow edema throughout the distal 50% of the fourth metatarsal with mild reactive marrow edema within the proximal shaft of the fourth metatarsal. There is a moderate to high-grade marrow edema within the proximal 40% of the proximal phalanx of the fifth toe with mild cortical step-off and likely erosion of the plantar proximal aspect (sagittal series 10, image 4, coronal series 9 image 14). Although there appear to be mild erosion of the medial aspect of the fifth metatarsal head on today's radiographs, no definitive cortical erosion is seen within the fifth metatarsal head on the current MRI. There is fluid and apparent soft tissue ulceration within the plantar lateral aspect of the proximal fifth toe (coronal series 9 images 13 and 14 and axial series 6 images 04/03/2021). Mild marrow edema within the plantar and lateral aspect of the third metatarsal head without definite cortical erosion. Mild-to-moderate lateral great toe metatarsal head degenerative spurring. Moderate to severe third tarsometatarsal joint space narrowing and subchondral degenerative cystic change. Ligaments The Lisfranc ligament complex is intact. Muscles and Tendons There is moderate 4th flexor digitorum longus tenosynovitis in the midfoot through the fourth metatarsal, with higher grade lobular tenosynovitis at the level of the phalanges. Soft tissues There is lobular decreased T1 and increased T2 signal fluid within the region of the fourth metatarsal head through the middle phalanx concerning for an abscess measuring up to approximately 2.0 x 2.4 x 3.6 cm in greatest transverse  by short axis of the foot by long axis of the foot dimensions (axial image 18, sagittal image 8, coronal image 11). There is moderate edema and swelling of the dorsal medial midfoot and plantar medial midfoot  subcutaneous fat. Moderate to high-grade subcutaneous fat edema and moderate swelling of the plantar lateral forefoot subcutaneous fat from the level of the distal metatarsal through the phalanges of the fourth digit. IMPRESSION: 1. There is high-grade marrow edema and cortical erosion indicating acute osteomyelitis of the fourth metatarsal head and fourth toe proximal distal phalanges. There is also high-grade marrow edema within the distal 50% of the fourth metatarsal with more mild marrow edema within the proximal shaft of the fourth metatarsal. This metatarsal shaft edema may be secondary to osteomyelitis or reactive. Findings concerning for moderate abscess surrounding the proximal phalanx and distal metatarsal of the fourth toe. 2. There is moderate to high-grade marrow edema within the proximal 40% of the proximal phalanx of the fifth toe with mild cortical step-off and likely erosion of the plantar proximal aspect. There is fluid and apparent soft tissue ulceration within the plantar lateral aspect of the proximal fifth toe. Findings are suspicious for acute osteomyelitis of the proximal phalanx of the fifth toe with a small adjacent abscess and soft tissue ulceration. 3. There is a lobular fluid collection within the region of the fourth metatarsal head through the middle phalanx concerning for an abscess. 4. Mild marrow edema within the plantar and lateral aspect of the third metatarsal head without definite cortical erosion. This may represent noninfectious osteitis. 5. Moderate 4th flexor digitorum longus likely septic tenosynovitis in the midfoot through the fourth metatarsal, with higher grade lobular likely septic tenosynovitis at the level of the phalanges of the fourth digit. 6. 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 Electronically Signed   By: Neita Garnet M.D.   On: 03/19/2022 18:19   MR ANKLE RIGHT WO CONTRAST  Result Date: 03/19/2022 CLINICAL DATA:  Soft tissue infection  suspected. 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 Patient reports swelling, pain, and foul smell coming from right foot for a couple of weeks. First noticed a blister between third and fourth toes approximately 2 weeks ago that has gotten progressively more swollen and malodorous. 5 2 painful to walk. EXAM: MRI OF THE RIGHT ANKLE WITHOUT CONTRAST TECHNIQUE: Multiplanar, multisequence MR imaging of the ankle was performed. No intravenous contrast was administered. COMPARISON:  Right foot radiographs 03/19/2022 FINDINGS: Despite efforts by the technologist and patient, motion artifact is present on today's exam and could not be eliminated. This reduces exam sensitivity and specificity. TENDONS Peroneal: The peroneus longus and brevis tendons are intact. Posteromedial: The posterior tibial tendon is intact. Moderate flexor digitorum longus tenosynovitis at the level of the distal tibia. There is mild-to-moderate marrow edema within the adjacent posterior aspect of the medial malleolus (axial series 8 image 22, coronal series 7, image 23). Note is made of history of right great toe pharyngeal amputation, and the distal aspect of the flexor hallucis longus tendon is not visualized distal to the midfoot, consistent with the prior surgery. Anterior: The tibialis anterior, extensor hallucis longus, and extensor digitorum longus tendons are intact. Achilles: Intact. Plantar Fascia: Intact. LIGAMENTS Lateral: The anterior and posterior talofibular, anterior and posterior tibiofibular, and calcaneofibular ligaments are intact. Medial: The tibiotalar deep deltoid and tibial spring ligaments are grossly intact, within limitations of motion artifact. CARTILAGE Ankle Joint: Mild-to-moderate anterior tibiotalar  cartilage thinning. Subtalar Joints/Sinus Tarsi: Fat is preserved within sinus tarsi. Moderate posterior aspect of the posterior subtalar joint cartilage thinning  with mild peripheral degenerative osteophytes. Bones: Moderate talonavicular and navicular-cuneiform cartilage thinning and dorsal osteophytosis. There is marrow edema within the partially visualized fourth metatarsal shaft. Other: There is decreased T1 and increased T2 signal fluid diffusely extending in between the posterior tibial, flexor digitorum longus, and the flexor hallucis longus muscles and tendons and the overlying medial and lateral gastrocnemius musculotendinous junctions at the proximal Achilles (axial images 1 through 25). This fluid extends off of the superior plane of view. The imaged portion of this fluid measures up to 3.8 cm in greatest transverse dimension, 2.1 cm in greatest AP dimension, and 12 cm in greatest craniocaudal length. This may connect to smaller regions of fluid seen medial to the posterior tibial and flexor digitorum longus tendons, within the subcutaneous fat of the medial ankle at the level of the distal tibia. There is also moderate edema and swelling of the lateral and posterolateral ankle subcutaneous fat. No definite soft tissue ulcer or marrow edema is visualized. The Lisfranc ligament complex is intact. There is mild-to-moderate tenosynovitis of the visualized flexor digitorum longus within the midfoot. IMPRESSION: 1. Moderate flexor digitorum longus tenosynovitis at the level of the distal tibia. The possible presence of infection within this tendon sheath fluid cannot be determined by MRI. There is mild-to-moderate marrow edema within the adjacent posterior aspect of the medial malleolus. This may be stress related/reactive. It is difficult to exclude early osteomyelitis. 2. Mild-to-moderate tenosynovitis of the visualized flexor digitorum longus within the midfoot. MRI cannot exclude infection within this tendon sheath fluid. 3. There is marrow edema within the fourth metatarsal shaft. Please see contemporaneous MRI of the forefoot report for further description of  findings of fourth metatarsal osteomyelitis. 4. There is fluid diffusely extending in between the posterior tibial, flexor digitorum longus, and flexor hallucis longus muscles and tendons and the overlying medial and lateral gastrocnemius musculotendinous junctions at the proximal Achilles. This fluid extends off of the superior plane of view. This may represent a large seroma or abscess. This may connect to small regions of fluid seen medial to the posterior tibial and flexor digitorum longus, within the subcutaneous fat of the medial ankle at the level of the distal tibia. 5. Moderate edema and swelling of the lateral and posterolateral ankle subcutaneous fat. Electronically Signed   By: Yvonne Kendall M.D.   On: 03/19/2022 17:48   DG Foot Complete Right  Result Date: 03/19/2022 CLINICAL DATA:  Assess for possible right foot osteomyelitis. Localized pain. EXAM: RIGHT FOOT COMPLETE - 3+ VIEW COMPARISON:  None. FINDINGS: Bony resorption with associated osteopenia of the distal fourth and fifth metatarsals, including the metatarsal heads, more severely of the fourth and the fifth. Suspected resorption at the base of the proximal phalanx of the fourth toe, possibly also of the fifth, assessment limited by positioning. No other evidence of osteomyelitis. Previous amputation of the great toe. Joints are normally aligned. Soft tissue swelling of the forefoot, and most prominently of the fourth toe. IMPRESSION: 1. Positive for osteomyelitis. There is osteomyelitis of the distal fourth and fifth metatarsals, more severely of the fourth, and of the base of the fourth toe proximal phalanx, possibly of the base of the fifth toe proximal phalanx. Electronically Signed   By: Lajean Manes M.D.   On: 03/19/2022 14:58    Anti-infectives: Anti-infectives (From admission, onward)    Start  Dose/Rate Route Frequency Ordered Stop   03/20/22 1800  vancomycin (VANCOCIN) IVPB 1000 mg/200 mL premix        1,000 mg 200 mL/hr  over 60 Minutes Intravenous Every 24 hours 03/19/22 1644     03/19/22 1600  ceFEPIme (MAXIPIME) 2 g in sodium chloride 0.9 % 100 mL IVPB        2 g 200 mL/hr over 30 Minutes Intravenous Every 12 hours 03/19/22 1500     03/19/22 1445  vancomycin (VANCOREADY) IVPB 2000 mg/400 mL        2,000 mg 200 mL/hr over 120 Minutes Intravenous  Once 03/19/22 1439 03/19/22 2155   03/19/22 1430  cefTRIAXone (ROCEPHIN) 2 g in sodium chloride 0.9 % 100 mL IVPB  Status:  Discontinued        2 g 200 mL/hr over 30 Minutes Intravenous Every 24 hours 03/19/22 1426 03/19/22 1450   03/19/22 1430  metroNIDAZOLE (FLAGYL) IVPB 500 mg        500 mg 100 mL/hr over 60 Minutes Intravenous Every 12 hours 03/19/22 1426 03/26/22 1429       Assessment/Plan: s/p * No surgery found * Assessment: Osteomyelitis with extensive infection and abscess extending into the proximal leg.  Plan: Discussed with the patient the MRI findings which revealed extensive infection in the foot and extending up into the leg.  Unfortunately as we previously discussed yesterday I do not think that the foot is salvageable at this point and most likely patient will need to undergo below-knee amputation which may have to be staged due to the proximal infection.  At this point podiatry will sign off and defer treatment to vascular surgery.  LOS: 1 day    Durward Fortes 03/20/2022

## 2022-03-20 NOTE — H&P (View-Only) (Signed)
Hospital Consult    Reason for Consult:  Osteomyelitis with extensive infection and Abscess.  Requesting Physician:  Dr Todd Cline MD MRN #:  [id number]  History of Present Illness: This is a 77 y.o. 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    Upon evaluation this morning the patient was found resting comfortably in bed.  No complaints overnight vitals all remained stable.  She continues on cefepime and vancomycin per pharmacy.  Patient's son was at the bedside with her this morning.  Past Medical History:  Diagnosis Date   Diabetes (HCC)    Hypertension     History reviewed. No pertinent surgical history.  Allergies  Allergen Reactions   Tomato Flavor [Flavoring Agent] Swelling    Prior to Admission medications   Medication Sig Start Date End Date Taking? Authorizing Provider  atenolol (TENORMIN) 50 MG tablet Take 50 mg by mouth daily. 03/19/22  Yes [provider]  chlorthalidone (HYGROTON) 25 MG tablet Take 25 mg by mouth daily.   Yes [provider]  diphenhydrAMINE (BENADRYL) 25 MG tablet Take 0.5 tablets (12.5 mg total) by mouth every 6 (six) hours as needed for up to 5 days for itching. 03/03/22 03/19/22 Yes Triplett, Cari B, FNP  glipiZIDE (GLUCOTROL) 5 MG tablet Take by mouth. 03/19/22  Yes [provider]  metFORMIN (GLUCOPHAGE) 500 MG tablet Take 1,000 mg by mouth 2 (two) times daily.   Yes [provider]    Social History   Socioeconomic History   Marital status: Divorced    Spouse name: Not on file   Number of children: Not on file   Years of education: Not on file   Highest education level: Not on file  Occupational History   Not on file  Tobacco Use   Smoking status: Never    Smokeless tobacco: Never  Substance and Sexual Activity   Alcohol use: Never   Drug use: Never   Sexual activity: Not Currently  Other Topics Concern   Not on file  Social History Narrative   Not on file   Social Determinants of Health   Financial Resource Strain: Not on file  Food Insecurity: No Food Insecurity (03/20/2022)   Hunger Vital Sign    Worried About Running Out of Food in the Last Year: Never true    Ran Out of Food in the Last Year: Never true  Transportation Needs: No Transportation Needs (03/20/2022)   PRAPARE - Transportation    Lack of Transportation (Medical): No    Lack of Transportation (Non-Medical): No  Physical Activity: Not on file  Stress: Not on file  Social Connections: Not on file  Intimate Partner Violence: Not At Risk (03/20/2022)   Humiliation, Afraid, Rape, and Kick questionnaire    Fear of Current or Ex-Partner: No    Emotionally Abused: No    Physically Abused: No    Sexually Abused: No     History reviewed. No pertinent family history.  ROS: Otherwise negative unless mentioned in HPI  Physical Examination  Vitals:   03/20/22 0300 03/20/22 0453  BP: (!) 130/57 (!) 144/67  Pulse: 90 93  Resp: 16 15  Temp: 98.1 F (36.7 C) 99.6 F (37.6 C)  SpO2: 99% 98%   Body mass index is 30.86 kg/m.    General:  WDWN in NAD Gait: Not observed HENT: WNL, normocephalic Pulmonary: normal non-labored breathing, without Rales, rhonchi,  wheezing Cardiac: regular, without  Murmurs, rubs or gallops; without carotid bruits Abdomen: Positive bowel sounds,  soft, NT/ND, no masses Skin: without rashes Vascular Exam/Pulses: Right lower extremity is warm to touch with +2 edema. Ankle and foot are wrapped. Unable to palpate PT pulse.  Extremities: with ischemic changes, with Gangrene , with cellulitis; with open wounds;  Musculoskeletal: no muscle wasting or atrophy  Neurologic: A&O X 3;  No focal weakness or paresthesias are detected; speech is  fluent/normal Psychiatric:  The pt has Normal affect. Lymph:  Unremarkable  CBC    Component Value Date/Time   WBC 19.2 (H) 03/19/2022 1246   RBC 4.01 03/19/2022 1246   HGB 10.6 (L) 03/19/2022 1246   HCT 33.3 (L) 03/19/2022 1246   PLT 352 03/19/2022 1246   MCV 83.0 03/19/2022 1246   MCH 26.4 03/19/2022 1246   MCHC 31.8 03/19/2022 1246   RDW 14.2 03/19/2022 1246   LYMPHSABS 1.3 03/19/2022 1246   MONOABS 1.4 (H) 03/19/2022 1246   EOSABS 0.1 03/19/2022 1246   BASOSABS 0.1 03/19/2022 1246    BMET    Component Value Date/Time   NA 124 (L) 03/19/2022 1246   K 5.0 03/19/2022 1246   CL 88 (L) 03/19/2022 1246   CO2 24 03/19/2022 1246   GLUCOSE 422 (H) 03/19/2022 1246   BUN 28 (H) 03/19/2022 1246   CREATININE 1.36 (H) 03/19/2022 1246   CALCIUM 8.5 (L) 03/19/2022 1246   GFRNONAA 41 (L) 03/19/2022 1246    COAGS: No results found for: "INR", "PROTIME"   Non-Invasive Vascular Imaging:   EXAM: MRI OF THE RIGHT ANKLE WITHOUT CONTRAST  IMPRESSION: 1. Moderate flexor digitorum longus tenosynovitis at the level of the distal tibia. The possible presence of infection within this tendon sheath fluid cannot be determined by MRI. There is mild-to-moderate marrow edema within the adjacent posterior aspect of the medial malleolus. This may be stress related/reactive. It is difficult to exclude early osteomyelitis. 2. Mild-to-moderate tenosynovitis of the visualized flexor digitorum longus within the midfoot. MRI cannot exclude infection within this tendon sheath fluid. 3. There is marrow edema within the fourth metatarsal shaft. Please see contemporaneous MRI of the forefoot report for further description of findings of fourth metatarsal osteomyelitis. 4. There is fluid diffusely extending in between the posterior tibial, flexor digitorum longus, and flexor hallucis longus muscles and tendons and the overlying medial and lateral gastrocnemius musculotendinous junctions at the  proximal Achilles. This fluid extends off of the superior plane of view. This may represent a large seroma or abscess. This may connect to small regions of fluid seen medial to the posterior tibial and flexor digitorum longus, within the subcutaneous fat of the medial ankle at the level of the distal tibia. 5. Moderate edema and swelling of the lateral and posterolateral ankle subcutaneous fat.  EXAM: MRI OF THE RIGHT FOREFOOT WITHOUT CONTRAST  IMPRESSION: 1. There is high-grade marrow edema and cortical erosion indicating acute osteomyelitis of the fourth metatarsal head and fourth toe proximal distal phalanges. There is also high-grade marrow edema within the distal 50% of the fourth metatarsal with more mild marrow edema within the proximal shaft of the fourth metatarsal. This metatarsal shaft edema may be secondary to osteomyelitis or reactive. Findings concerning for moderate abscess surrounding the proximal phalanx and distal metatarsal of the fourth toe. 2. There is moderate to high-grade marrow edema within the proximal  40% of the proximal phalanx of the fifth toe with mild cortical step-off and likely erosion of the plantar proximal aspect. There is fluid and apparent soft tissue ulceration within the plantar lateral aspect of the proximal fifth toe. Findings are suspicious for acute osteomyelitis of the proximal phalanx of the fifth toe with a small adjacent abscess and soft tissue ulceration. 3. There is a lobular fluid collection within the region of the fourth metatarsal head through the middle phalanx concerning for an abscess. 4. Mild marrow edema within the plantar and lateral aspect of the third metatarsal head without definite cortical erosion. This may represent noninfectious osteitis. 5. Moderate 4th flexor digitorum longus likely septic tenosynovitis in the midfoot through the fourth metatarsal, with higher grade lobular likely septic tenosynovitis at the  level of the phalanges of the fourth digit. 6. 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  Statin:  No. Beta Blocker:  Yes.   Aspirin:  No. ACEI:  No. ARB:  No. CCB use:  No Other antiplatelets/anticoagulants:  No.    ASSESSMENT/PLAN: This is a 77 y.o. 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    PLAN:   Right lower extremity angiogram tomorrow to 09/01/2022.  I discussed with both the patient and the son this morning the procedure, benefits, risks, and complications.  They were told that the angiogram will be used to determine what type of amputation of the right lower extremity will be needed.  At minimum it would be metatarsal removal.  At maximum it would allow us to evaluate blood flow for either BKA or AKA.  Determination of what level of amputation would be determined after findings from right lower extremity angiogram.  The patient and the son would like to proceed as soon as possible.  Will be made n.p.o. after midnight tonight.   -Current plan was discussed with Dr. Jason Dew MD and he is in agreement with the plan.   Adonis Yim R [facility] 03/20/2022 9:12 AM  

## 2022-03-20 NOTE — Progress Notes (Signed)
PROGRESS NOTE  Melanie Juarez WHQ:[id number] DOB: 1945/03/31 DOA: 03/19/2022 PCP: System, Provider Not In  Hospital Course/Subjective: 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   Seen this AM with son at bedside. She is comfortable and would like to eat.  Assessment/Plan:   * Osteomyelitis (HCC) - Cefepime and vancomycin per pharmacy - Continue with metronidazole per EDP - Check blood cultures x 2 given markedly elevated white cell count concerning for hematogenous spread - Admit to telemetry medical, inpatient -- DPM recommendations noted, Vascular has seen plans angio in AM  Sepsis with acute organ dysfunction ([place]), resolved - Elevated lactic acid improved to normal, leukocytosis, source of right foot, organ is renal involvement - Sodium chloride 150 milliliters per hour, 1 day ordered - Continue broad-spectrum antibiotics  AKI (acute kidney injury) ([place]) - Etiology workup in progress, differentials include prerenal secondary to poor p.o. intake versus sepsis with organ involvement -- will recheck with AM labs  Leukocytosis Patient meets criteria for sepsis, leukocytosis, elevated lactic acid, source of diabetic foot infection - Given increased lactic acid and marked leukocytosis with source of infection of a diabetic foot wound, sepsis cannot be ruled out at this time - Presumed secondary to osteomyelitis - Blood cultures x 2 and are in process - Will check a third lactic acid - Continue cefepime, vancomycin, metronidazole 500 mg IV twice daily - Admit to telemetry medical, inpatient  Essential hypertension - Resumed atenolol 50 mg daily - Hydralazine 5 mg IV every 8 hours as needed for SBP  greater than 175, 4 days ordered  Diabetes mellitus type 2, noninsulin dependent (HCC) - Home glipizide 5 mg and metformin daily were not resumed on admission - One-time dose of insulin aspart 15 units on admission for blood glucose level of 422 - Insulin SSI with at bedtime coverage ordered - Goal inpatient blood glucose level is 140-180  DVT Prophylaxis: Hep SQ  Code Status: FULL   Family Communication: Discussed with patient and son at the bedside this AM.   Disposition Plan: Pending the above.   Consultants: Podiatry Dr. [name] (signed off) Vascular surgery   Antimicrobials: Anti-infectives (From admission, onward)    Start     Dose/Rate Route Frequency Ordered Stop   03/20/22 1800  vancomycin (VANCOCIN) IVPB 1000 mg/200 mL premix        1,000 mg 200 mL/hr over 60 Minutes Intravenous Every 24 hours 03/19/22 1644     03/19/22 1600  ceFEPIme (MAXIPIME) 2 g in sodium chloride 0.9 % 100 mL IVPB        2 g 200 mL/hr over 30 Minutes Intravenous Every 12 hours 03/19/22 1500     03/19/22 1445  vancomycin (VANCOREADY) IVPB 2000 mg/400 mL        2,000 mg 200 mL/hr over 120 Minutes Intravenous  Once 03/19/22 1439 03/19/22 2155   03/19/22 1430  cefTRIAXone (ROCEPHIN) 2 g in sodium chloride 0.9 % 100 mL IVPB  Status:  Discontinued        2 g 200 mL/hr over 30 Minutes Intravenous Every 24 hours 03/19/22 1426 03/19/22 1450   03/19/22 1430  metroNIDAZOLE (FLAGYL) IVPB 500 mg        500 mg 100 mL/hr over 60 Minutes Intravenous Every  12 hours 03/19/22 1426 03/26/22 1429       Objective: Vitals:   03/20/22 0131 03/20/22 0200 03/20/22 0300 03/20/22 0453  BP: 114/64 (!) 126/55 (!) 130/57 (!) 144/67  Pulse: 82 86 90 93  Resp: 16 17 16 15   Temp: 98.3 F (36.8 C)  98.1 F (36.7 C) 99.6 F (37.6 C)  TempSrc: Oral  Oral Oral  SpO2: 97% 96% 99% 98%  Weight:      Height:        Intake/Output Summary (Last 24 hours) at 03/20/2022 1007 Last data filed at 03/20/2022 [id number] Gross per 24  hour  Intake 3420.63 ml  Output --  Net 3420.63 ml   Filed Weights   03/19/22 1446  Weight: 90.7 kg   Exam: General:  Alert, oriented, calm, in no acute distress, resting comfortably, nontoxic  Eyes: EOMI, clear sclerea Neck: supple, no masses, trachea mildline  Cardiovascular: RRR, no murmurs or rubs, no peripheral edema  Respiratory: clear to auscultation bilaterally, no wheezes, no crackles  Abdomen: soft, nontender, nondistended, normal bowel tones heard  Skin: dry, no rashes  Musculoskeletal: no joint effusions, normal range of motion, legs wrapped. Psychiatric: appropriate affect, normal speech  Neurologic: extraocular muscles intact, clear speech, moving all extremities with intact sensorium   Data Reviewed: CBC: Recent Labs  Lab 03/19/22 1246  WBC 19.2*  NEUTROABS 16.1*  HGB 10.6*  HCT 33.3*  MCV 83.0  PLT [id number]   Basic Metabolic Panel: Recent Labs  Lab 03/19/22 1246  NA 124*  K 5.0  CL 88*  CO2 24  GLUCOSE 422*  BUN 28*  CREATININE 1.36*  CALCIUM 8.5*   GFR: Estimated Creatinine Clearance: 42.4 mL/min (A) (by C-G formula based on SCr of 1.36 mg/dL (H)). Liver Function Tests: No results for input(s): "AST", "ALT", "ALKPHOS", "BILITOT", "PROT", "ALBUMIN" in the last 168 hours. No results for input(s): "LIPASE", "AMYLASE" in the last 168 hours. No results for input(s): "AMMONIA" in the last 168 hours. Coagulation Profile: No results for input(s): "INR", "PROTIME" in the last 168 hours. Cardiac Enzymes: No results for input(s): "CKTOTAL", "CKMB", "CKMBINDEX", "TROPONINI" in the last 168 hours. BNP (last 3 results) No results for input(s): "PROBNP" in the last 8760 hours. HbA1C: No results for input(s): "HGBA1C" in the last 72 hours. CBG: Recent Labs  Lab 03/19/22 1522 03/19/22 1606 03/19/22 1813 03/19/22 2211 03/20/22 0835  GLUCAP 417* 384* 203* 139* 147*   Lipid Profile: No results for input(s): "CHOL", "HDL", "LDLCALC", "TRIG", "CHOLHDL",  "LDLDIRECT" in the last 72 hours. Thyroid Function Tests: No results for input(s): "TSH", "T4TOTAL", "FREET4", "T3FREE", "THYROIDAB" in the last 72 hours. Anemia Panel: No results for input(s): "VITAMINB12", "FOLATE", "FERRITIN", "TIBC", "IRON", "RETICCTPCT" in the last 72 hours. Urine analysis: No results found for: "COLORURINE", "APPEARANCEUR", "LABSPEC", "PHURINE", "GLUCOSEU", "HGBUR", "BILIRUBINUR", "KETONESUR", "PROTEINUR", "UROBILINOGEN", "NITRITE", "LEUKOCYTESUR" Sepsis Labs: @LABRCNTIP (procalcitonin:4,lacticidven:4)  ) Recent Results (from the past 240 hour(s))  Culture, blood (Routine X 2) w Reflex to ID Panel     Status: None (Preliminary result)   Collection Time: 03/19/22  6:11 PM   Specimen: BLOOD  Result Value Ref Range Status   Specimen Description BLOOD BLOOD RIGHT HAND  Final   Special Requests   Final    BOTTLES DRAWN AEROBIC AND ANAEROBIC Blood Culture adequate volume   Culture   Final    NO GROWTH < 12 HOURS Performed at [facility], [address] [place], [place] [address]    Report Status PENDING  Incomplete  Culture, blood (Routine X 2) w Reflex to ID Panel     Status: None (Preliminary result)   Collection Time: 03/19/22  6:18 PM   Specimen: BLOOD  Result Value Ref Range Status   Specimen Description BLOOD BLOOD LEFT HAND  Final   Special Requests   Final    BOTTLES DRAWN AEROBIC ONLY Blood Culture results may not be optimal due to an inadequate volume of blood received in culture bottles   Culture   Final    NO GROWTH < 12 HOURS Performed at [facility], [address] [place], [place] [address]    Report Status PENDING  Incomplete     Studies: MRI Right foot without contrast  Result Date: 03/19/2022 CLINICAL DATA:  Soft tissue infection suspected. Findings concerning for osteomyelitis of the distal fourth greater than fifth metatarsals and base of the fourth proximal phalanx on today's radiographs. EXAM: MRI OF THE RIGHT  FOREFOOT WITHOUT CONTRAST TECHNIQUE: Multiplanar, multisequence MR imaging of the right forefoot was performed. No intravenous contrast was administered. COMPARISON:  Right foot radiographs 03/19/2022 FINDINGS: Bones/Joint/Cartilage As seen on today's radiographs, there is erosion of the lateral greater than medial aspect of the fourth metatarsal head and the proximal aspect of the proximal phalanx of the fourth toe. There also appears to be high-grade erosion of the distal phalanx of the fourth toe. There is high-grade marrow edema throughout the majority of the proximal distal phalanges of the fourth toe. High-grade marrow edema throughout the distal 50% of the fourth metatarsal with mild reactive marrow edema within the proximal shaft of the fourth metatarsal. There is a moderate to high-grade marrow edema within the proximal 40% of the proximal phalanx of the fifth toe with mild cortical step-off and likely erosion of the plantar proximal aspect (sagittal series 10, image 4, coronal series 9 image 14). Although there appear to be mild erosion of the medial aspect of the fifth metatarsal head on today's radiographs, no definitive cortical erosion is seen within the fifth metatarsal head on the current MRI. There is fluid and apparent soft tissue ulceration within the plantar lateral aspect of the proximal fifth toe (coronal series 9 images 13 and 14 and axial series 6 images 04/03/2021). Mild marrow edema within the plantar and lateral aspect of the third metatarsal head without definite cortical erosion. Mild-to-moderate lateral great toe metatarsal head degenerative spurring. Moderate to severe third tarsometatarsal joint space narrowing and subchondral degenerative cystic change. Ligaments The Lisfranc ligament complex is intact. Muscles and Tendons There is moderate 4th flexor digitorum longus tenosynovitis in the midfoot through the fourth metatarsal, with higher grade lobular tenosynovitis at the level of  the phalanges. Soft tissues There is lobular decreased T1 and increased T2 signal fluid within the region of the fourth metatarsal head through the middle phalanx concerning for an abscess measuring up to approximately 2.0 x 2.4 x 3.6 cm in greatest transverse by short axis of the foot by long axis of the foot dimensions (axial image 18, sagittal image 8, coronal image 11). There is moderate edema and swelling of the dorsal medial midfoot and plantar medial midfoot subcutaneous fat. Moderate to high-grade subcutaneous fat edema and moderate swelling of the plantar lateral forefoot subcutaneous fat from the level of the distal metatarsal through the phalanges of the fourth digit. IMPRESSION: 1. There is high-grade marrow edema and cortical erosion indicating acute osteomyelitis of the fourth metatarsal head and fourth toe proximal distal phalanges. There is also high-grade marrow edema within  the distal 50% of the fourth metatarsal with more mild marrow edema within the proximal shaft of the fourth metatarsal. This metatarsal shaft edema may be secondary to osteomyelitis or reactive. Findings concerning for moderate abscess surrounding the proximal phalanx and distal metatarsal of the fourth toe. 2. There is moderate to high-grade marrow edema within the proximal 40% of the proximal phalanx of the fifth toe with mild cortical step-off and likely erosion of the plantar proximal aspect. There is fluid and apparent soft tissue ulceration within the plantar lateral aspect of the proximal fifth toe. Findings are suspicious for acute osteomyelitis of the proximal phalanx of the fifth toe with a small adjacent abscess and soft tissue ulceration. 3. There is a lobular fluid collection within the region of the fourth metatarsal head through the middle phalanx concerning for an abscess. 4. Mild marrow edema within the plantar and lateral aspect of the third metatarsal head without definite cortical erosion. This may represent  noninfectious osteitis. 5. Moderate 4th flexor digitorum longus likely septic tenosynovitis in the midfoot through the fourth metatarsal, with higher grade lobular likely septic tenosynovitis at the level of the phalanges of the fourth digit. 6. 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 Electronically Signed   By: Neita Garnet M.D.   On: 03/19/2022 18:19   MR ANKLE RIGHT WO CONTRAST  Result Date: 03/19/2022 CLINICAL DATA:  Soft tissue infection suspected. 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 Patient reports swelling, pain, and foul smell coming from right foot for a couple of weeks. First noticed a blister between third and fourth toes approximately 2 weeks ago that has gotten progressively more swollen and malodorous. 5 2 painful to walk. EXAM: MRI OF THE RIGHT ANKLE WITHOUT CONTRAST TECHNIQUE: Multiplanar, multisequence MR imaging of the ankle was performed. No intravenous contrast was administered. COMPARISON:  Right foot radiographs 03/19/2022 FINDINGS: Despite efforts by the technologist and patient, motion artifact is present on today's exam and could not be eliminated. This reduces exam sensitivity and specificity. TENDONS Peroneal: The peroneus longus and brevis tendons are intact. Posteromedial: The posterior tibial tendon is intact. Moderate flexor digitorum longus tenosynovitis at the level of the distal tibia. There is mild-to-moderate marrow edema within the adjacent posterior aspect of the medial malleolus (axial series 8 image 22, coronal series 7, image 23). Note is made of history of right great toe pharyngeal amputation, and the distal aspect of the flexor hallucis longus tendon is not visualized distal to the midfoot, consistent with the prior surgery. Anterior: The tibialis anterior, extensor hallucis longus, and extensor digitorum longus tendons are intact. Achilles: Intact. Plantar Fascia: Intact. LIGAMENTS Lateral:  The anterior and posterior talofibular, anterior and posterior tibiofibular, and calcaneofibular ligaments are intact. Medial: The tibiotalar deep deltoid and tibial spring ligaments are grossly intact, within limitations of motion artifact. CARTILAGE Ankle Joint: Mild-to-moderate anterior tibiotalar cartilage thinning. Subtalar Joints/Sinus Tarsi: Fat is preserved within sinus tarsi. Moderate posterior aspect of the posterior subtalar joint cartilage thinning with mild peripheral degenerative osteophytes. Bones: Moderate talonavicular and navicular-cuneiform cartilage thinning and dorsal osteophytosis. There is marrow edema within the partially visualized fourth metatarsal shaft. Other: There is decreased T1 and increased T2 signal fluid diffusely extending in between the posterior tibial, flexor digitorum longus, and the flexor hallucis longus muscles and tendons and the overlying medial and lateral gastrocnemius musculotendinous junctions at the proximal Achilles (axial images 1 through 25). This fluid extends off of the superior  plane of view. The imaged portion of this fluid measures up to 3.8 cm in greatest transverse dimension, 2.1 cm in greatest AP dimension, and 12 cm in greatest craniocaudal length. This may connect to smaller regions of fluid seen medial to the posterior tibial and flexor digitorum longus tendons, within the subcutaneous fat of the medial ankle at the level of the distal tibia. There is also moderate edema and swelling of the lateral and posterolateral ankle subcutaneous fat. No definite soft tissue ulcer or marrow edema is visualized. The Lisfranc ligament complex is intact. There is mild-to-moderate tenosynovitis of the visualized flexor digitorum longus within the midfoot. IMPRESSION: 1. Moderate flexor digitorum longus tenosynovitis at the level of the distal tibia. The possible presence of infection within this tendon sheath fluid cannot be determined by MRI. There is  mild-to-moderate marrow edema within the adjacent posterior aspect of the medial malleolus. This may be stress related/reactive. It is difficult to exclude early osteomyelitis. 2. Mild-to-moderate tenosynovitis of the visualized flexor digitorum longus within the midfoot. MRI cannot exclude infection within this tendon sheath fluid. 3. There is marrow edema within the fourth metatarsal shaft. Please see contemporaneous MRI of the forefoot report for further description of findings of fourth metatarsal osteomyelitis. 4. There is fluid diffusely extending in between the posterior tibial, flexor digitorum longus, and flexor hallucis longus muscles and tendons and the overlying medial and lateral gastrocnemius musculotendinous junctions at the proximal Achilles. This fluid extends off of the superior plane of view. This may represent a large seroma or abscess. This may connect to small regions of fluid seen medial to the posterior tibial and flexor digitorum longus, within the subcutaneous fat of the medial ankle at the level of the distal tibia. 5. Moderate edema and swelling of the lateral and posterolateral ankle subcutaneous fat. Electronically Signed   By: Yvonne Kendall M.D.   On: 03/19/2022 17:48   DG Foot Complete Right  Result Date: 03/19/2022 CLINICAL DATA:  Assess for possible right foot osteomyelitis. Localized pain. EXAM: RIGHT FOOT COMPLETE - 3+ VIEW COMPARISON:  None. FINDINGS: Bony resorption with associated osteopenia of the distal fourth and fifth metatarsals, including the metatarsal heads, more severely of the fourth and the fifth. Suspected resorption at the base of the proximal phalanx of the fourth toe, possibly also of the fifth, assessment limited by positioning. No other evidence of osteomyelitis. Previous amputation of the great toe. Joints are normally aligned. Soft tissue swelling of the forefoot, and most prominently of the fourth toe. IMPRESSION: 1. Positive for osteomyelitis. There is  osteomyelitis of the distal fourth and fifth metatarsals, more severely of the fourth, and of the base of the fourth toe proximal phalanx, possibly of the base of the fifth toe proximal phalanx. Electronically Signed   By: Lajean Manes M.D.   On: 03/19/2022 14:58    Scheduled Meds:  atenolol  50 mg Oral Daily   insulin aspart  0-5 Units Subcutaneous QHS   insulin aspart  0-9 Units Subcutaneous TID WC    Continuous Infusions:  sodium chloride 150 mL/hr at 03/20/22 0724   ceFEPime (MAXIPIME) IV 2 g (03/20/22 0527)   metronidazole Stopped (03/20/22 0342)   vancomycin       LOS: 1 day   Time spent: 31 minutes  Jameis Newsham Neva Seat, MD [facility] Pager [phone]  If 7PM-7AM, please contact night-coverage [email or web address] Password [email or web address] 03/20/2022, 10:07 AM

## 2022-03-21 ENCOUNTER — Encounter
Admission: EM | Disposition: A | Discharge status: Skilled Nursing Facility | Payer: Self-pay | Source: Home / Self Care | Attending: Internal Medicine

## 2022-03-21 DIAGNOSIS — I70291 Other atherosclerosis of native arteries of extremities, right leg: Secondary | ICD-10-CM

## 2022-03-21 DIAGNOSIS — E669 Obesity, unspecified: Secondary | ICD-10-CM | POA: Diagnosis not present

## 2022-03-21 DIAGNOSIS — E119 Type 2 diabetes mellitus without complications: Secondary | ICD-10-CM

## 2022-03-21 DIAGNOSIS — L089 Local infection of the skin and subcutaneous tissue, unspecified: Secondary | ICD-10-CM

## 2022-03-21 DIAGNOSIS — I10 Essential (primary) hypertension: Secondary | ICD-10-CM

## 2022-03-21 DIAGNOSIS — N179 Acute kidney failure, unspecified: Secondary | ICD-10-CM | POA: Diagnosis not present

## 2022-03-21 DIAGNOSIS — D72825 Bandemia: Secondary | ICD-10-CM

## 2022-03-21 DIAGNOSIS — M869 Osteomyelitis, unspecified: Secondary | ICD-10-CM | POA: Diagnosis not present

## 2022-03-21 DIAGNOSIS — I739 Peripheral vascular disease, unspecified: Secondary | ICD-10-CM

## 2022-03-21 DIAGNOSIS — E785 Hyperlipidemia, unspecified: Secondary | ICD-10-CM

## 2022-03-21 DIAGNOSIS — E872 Acidosis, unspecified: Secondary | ICD-10-CM | POA: Insufficient documentation

## 2022-03-21 HISTORY — PX: LOWER EXTREMITY ANGIOGRAPHY: CATH118251

## 2022-03-21 LAB — CBC
HCT: 24.9 % — ABNORMAL LOW (ref 36.0–46.0)
Hemoglobin: 8.1 g/dL — ABNORMAL LOW (ref 12.0–15.0)
MCH: 26.7 pg (ref 26.0–34.0)
MCHC: 32.5 g/dL (ref 30.0–36.0)
MCV: 82.2 fL (ref 80.0–100.0)
Platelets: 262 10*3/uL (ref 150–400)
RBC: 3.03 MIL/uL — ABNORMAL LOW (ref 3.87–5.11)
RDW: 14.6 % (ref 11.5–15.5)
WBC: 13.8 10*3/uL — ABNORMAL HIGH (ref 4.0–10.5)
nRBC: 0 % (ref 0.0–0.2)

## 2022-03-21 LAB — GLUCOSE, CAPILLARY
Glucose-Capillary: 231 mg/dL — ABNORMAL HIGH (ref 70–99)
Glucose-Capillary: 235 mg/dL — ABNORMAL HIGH (ref 70–99)
Glucose-Capillary: 310 mg/dL — ABNORMAL HIGH (ref 70–99)
Glucose-Capillary: 276 mg/dL — ABNORMAL HIGH (ref 70–99)
Glucose-Capillary: 187 mg/dL — ABNORMAL HIGH (ref 70–99)

## 2022-03-21 LAB — BASIC METABOLIC PANEL
Anion gap: 6 (ref 5–15)
BUN: 15 mg/dL (ref 8–23)
CO2: 22 mmol/L (ref 22–32)
Calcium: 7.4 mg/dL — ABNORMAL LOW (ref 8.9–10.3)
Chloride: 103 mmol/L (ref 98–111)
Creatinine, Ser: 0.84 mg/dL (ref 0.44–1.00)
GFR, Estimated: >60 mL/min (ref >60)
Glucose, Bld: 218 mg/dL — ABNORMAL HIGH (ref 70–99)
Potassium: 3.5 mmol/L (ref 3.5–5.1)
Sodium: 131 mmol/L — ABNORMAL LOW (ref 135–145)

## 2022-03-21 SURGERY — LOWER EXTREMITY ANGIOGRAPHY
Anesthesia: Moderate Sedation | Laterality: Right

## 2022-03-21 MED ORDER — FENTANYL CITRATE (PF) 100 MCG/2ML IJ SOLN
INTRAMUSCULAR | Status: DC | PRN
  Administered 2022-03-21: 50 ug via INTRAVENOUS

## 2022-03-21 MED ORDER — CHLORHEXIDINE GLUCONATE CLOTH 2 % EX PADS
6.0000 | MEDICATED_PAD | Freq: Once | CUTANEOUS | Status: AC
  Administered 2022-03-21: 6 via TOPICAL

## 2022-03-21 MED ORDER — FENTANYL CITRATE PF 50 MCG/ML IJ SOSY
PREFILLED_SYRINGE | INTRAMUSCULAR | Status: AC
  Filled 2022-03-21: qty 1

## 2022-03-21 MED ORDER — VANCOMYCIN HCL 1250 MG/250ML IV SOLN
1250.0000 mg | INTRAVENOUS | Status: DC
  Administered 2022-03-21 – 2022-03-24 (×4): 1250 mg via INTRAVENOUS
  Filled 2022-03-21 (×5): qty 250

## 2022-03-21 MED ORDER — HEPARIN SODIUM (PORCINE) 1000 UNIT/ML IJ SOLN
INTRAMUSCULAR | Status: AC
  Filled 2022-03-21: qty 10

## 2022-03-21 MED ORDER — INSULIN GLARGINE-YFGN 100 UNIT/ML ~~LOC~~ SOLN
10.0000 [IU] | Freq: Every day | SUBCUTANEOUS | Status: DC
  Administered 2022-03-21 – 2022-03-23 (×3): 10 [IU] via SUBCUTANEOUS
  Filled 2022-03-21 (×3): qty 0.1

## 2022-03-21 MED ORDER — NITROGLYCERIN 1 MG/10 ML FOR IR/CATH LAB
INTRA_ARTERIAL | Status: DC | PRN
  Administered 2022-03-21: 300 ug via INTRA_ARTERIAL

## 2022-03-21 MED ORDER — ATORVASTATIN CALCIUM 20 MG PO TABS
40.0000 mg | ORAL_TABLET | Freq: Every day | ORAL | Status: DC
  Administered 2022-03-21 – 2022-03-28 (×6): 40 mg via ORAL
  Filled 2022-03-21 (×6): qty 2

## 2022-03-21 MED ORDER — CHLORHEXIDINE GLUCONATE CLOTH 2 % EX PADS
6.0000 | MEDICATED_PAD | Freq: Once | CUTANEOUS | Status: AC
  Administered 2022-03-22: 6 via TOPICAL

## 2022-03-21 MED ORDER — CEFAZOLIN SODIUM-DEXTROSE 2-4 GM/100ML-% IV SOLN
2.0000 g | INTRAVENOUS | Status: AC
  Administered 2022-03-21: 2 g via INTRAVENOUS

## 2022-03-21 MED ORDER — INSULIN GLARGINE-YFGN 100 UNIT/ML ~~LOC~~ SOLN
10.0000 [IU] | Freq: Every day | SUBCUTANEOUS | Status: DC
  Administered 2022-03-21: 10 [IU] via SUBCUTANEOUS
  Filled 2022-03-21: qty 0.1

## 2022-03-21 MED ORDER — HYDROMORPHONE HCL 1 MG/ML IJ SOLN: 1.0000 mg | Freq: Once | INTRAMUSCULAR | Status: DC | PRN

## 2022-03-21 MED ORDER — MIDAZOLAM HCL 2 MG/ML PO SYRP: 8.0000 mg | ORAL_SOLUTION | Freq: Once | ORAL | Status: DC | PRN

## 2022-03-21 MED ORDER — MIDAZOLAM HCL 2 MG/2ML IJ SOLN
INTRAMUSCULAR | Status: DC | PRN
  Administered 2022-03-21: 2 mg via INTRAVENOUS

## 2022-03-21 MED ORDER — INSULIN ASPART 100 UNIT/ML IJ SOLN
3.0000 [IU] | Freq: Three times a day (TID) | INTRAMUSCULAR | Status: DC
  Administered 2022-03-21 – 2022-03-23 (×3): 3 [IU] via SUBCUTANEOUS
  Filled 2022-03-21 (×3): qty 1

## 2022-03-21 MED ORDER — FENTANYL CITRATE (PF) 100 MCG/2ML IJ SOLN: 12.5000 ug | Freq: Once | INTRAMUSCULAR | Status: DC | PRN

## 2022-03-21 MED ORDER — SODIUM CHLORIDE 0.9 % IV SOLN: INTRAVENOUS | Status: DC

## 2022-03-21 MED ORDER — NITROGLYCERIN 1 MG/10 ML FOR IR/CATH LAB
INTRA_ARTERIAL | Status: AC
  Filled 2022-03-21: qty 10

## 2022-03-21 MED ORDER — INSULIN ASPART 100 UNIT/ML IJ SOLN
0.0000 [IU] | Freq: Three times a day (TID) | INTRAMUSCULAR | Status: DC
  Administered 2022-03-21: 8 [IU] via SUBCUTANEOUS
  Administered 2022-03-23: 3 [IU] via SUBCUTANEOUS
  Administered 2022-03-23: 5 [IU] via SUBCUTANEOUS
  Administered 2022-03-23: 3 [IU] via SUBCUTANEOUS
  Administered 2022-03-24 – 2022-03-25 (×3): 2 [IU] via SUBCUTANEOUS
  Administered 2022-03-26: 3 [IU] via SUBCUTANEOUS
  Administered 2022-03-26: 5 [IU] via SUBCUTANEOUS
  Administered 2022-03-27: 2 [IU] via SUBCUTANEOUS
  Administered 2022-03-28: 5 [IU] via SUBCUTANEOUS
  Administered 2022-03-28: 8 [IU] via SUBCUTANEOUS
  Filled 2022-03-21 (×12): qty 1

## 2022-03-21 MED ORDER — CEFAZOLIN SODIUM-DEXTROSE 2-4 GM/100ML-% IV SOLN
INTRAVENOUS | Status: AC
  Filled 2022-03-21: qty 100

## 2022-03-21 MED ORDER — INSULIN ASPART 100 UNIT/ML IJ SOLN
0.0000 [IU] | Freq: Every day | INTRAMUSCULAR | Status: DC
  Administered 2022-03-26: 2 [IU] via SUBCUTANEOUS
  Filled 2022-03-21: qty 1

## 2022-03-21 MED ORDER — IODIXANOL 320 MG/ML IV SOLN
INTRAVENOUS | Status: DC | PRN
  Administered 2022-03-21: 45 mL via INTRA_ARTERIAL

## 2022-03-21 MED ORDER — SODIUM CHLORIDE 0.9 % IV SOLN
2.0000 g | Freq: Three times a day (TID) | INTRAVENOUS | Status: DC
  Administered 2022-03-21 – 2022-03-28 (×20): 2 g via INTRAVENOUS
  Filled 2022-03-21 (×9): qty 2
  Filled 2022-03-21: qty 12.5
  Filled 2022-03-21 (×5): qty 2
  Filled 2022-03-21: qty 12.5
  Filled 2022-03-21 (×2): qty 2
  Filled 2022-03-21 (×2): qty 12.5
  Filled 2022-03-21: qty 2
  Filled 2022-03-21: qty 12.5

## 2022-03-21 MED ORDER — METHYLPREDNISOLONE SODIUM SUCC 125 MG IJ SOLR: 125.0000 mg | Freq: Once | INTRAMUSCULAR | Status: DC | PRN

## 2022-03-21 MED ORDER — FAMOTIDINE 20 MG PO TABS: 40.0000 mg | ORAL_TABLET | Freq: Once | ORAL | Status: DC | PRN

## 2022-03-21 MED ORDER — DIPHENHYDRAMINE HCL 50 MG/ML IJ SOLN: 50.0000 mg | Freq: Once | INTRAMUSCULAR | Status: DC | PRN

## 2022-03-21 MED ORDER — MIDAZOLAM HCL 2 MG/2ML IJ SOLN
INTRAMUSCULAR | Status: AC
  Filled 2022-03-21: qty 2

## 2022-03-21 MED ORDER — ONDANSETRON HCL 4 MG/2ML IJ SOLN
4.0000 mg | Freq: Four times a day (QID) | INTRAMUSCULAR | Status: DC | PRN
  Filled 2022-03-21 (×2): qty 2

## 2022-03-21 MED ORDER — HEPARIN SODIUM (PORCINE) 1000 UNIT/ML IJ SOLN
INTRAMUSCULAR | Status: DC | PRN
  Administered 2022-03-21: 5000 [IU] via INTRAVENOUS

## 2022-03-21 SURGICAL SUPPLY — 17 items
BALLN LUTONIX DCB 4X80X130 (BALLOONS) ×1
BALLN LUTONIX DCB 5X80X130 (BALLOONS) ×1
BALLOON LUTONIX DCB 4X80X130 (BALLOONS) IMPLANT
BALLOON LUTONIX DCB 5X80X130 (BALLOONS) IMPLANT
CATH ANGIO 5F PIGTAIL 65CM (CATHETERS) IMPLANT
CATH VERT 5X100 (CATHETERS) IMPLANT
COVER PROBE ULTRASOUND 5X96 (MISCELLANEOUS) IMPLANT
DEVICE STARCLOSE SE CLOSURE (Vascular Products) IMPLANT
GLIDEWIRE ADV .035X260CM (WIRE) IMPLANT
KIT ENCORE 26 ADVANTAGE (KITS) IMPLANT
PACK ANGIOGRAPHY (CUSTOM PROCEDURE TRAY) ×1 IMPLANT
SHEATH BRITE TIP 5FRX11 (SHEATH) IMPLANT
SHEATH RAABE 6FRX70 (SHEATH) IMPLANT
SYR MEDRAD MARK 7 150ML (SYRINGE) IMPLANT
TUBING CONTRAST HIGH PRESS 72 (TUBING) IMPLANT
WIRE G V18X300CM (WIRE) IMPLANT
WIRE GUIDERIGHT .035X150 (WIRE) IMPLANT

## 2022-03-21 NOTE — Progress Notes (Signed)
PROGRESS NOTE  Melanie Juarez BWG:[id number] DOB: 07/15/1945   PCP: System, Provider Not In  Patient is from: Home.  Lives alone.  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  Blood cultures obtained.  Patient was started on broad-spectrum antibiotics.  Podiatry consulted and recommended vascular surgery evaluation.  Patient underwent aortogram and revascularization with right popliteal and right posterior tibial balloon angioplasty on 03/21/2022.  Blood cultures NGTD.  Remains on broad-spectrum antibiotics.    Subjective: Seen and examined earlier this morning after she returned from vascular procedure.  Patient's daughter at bedside.  Patient has no complaints other than some back pain from lying in bed.  Reports not taking a statin at home.  Unclear why but she thinks he had doctor did not fill it.  Objective: Vitals:   03/21/22 0930 03/21/22 0945 03/21/22 1000 03/21/22 1029  BP: (!) 125/47 (!) 115/47 (!) 120/59 107/66  Pulse: 88 87 90 92  Resp: 16 19 19 16   Temp:    98.7 F (37.1 C)  TempSrc:    Oral  SpO2: 96% 96% 97% 99%  Weight:      Height:        Examination:  GENERAL: No apparent distress.  Nontoxic. HEENT: MMM.  Vision and hearing grossly intact.  NECK: Supple.  No apparent JVD.  RESP:  No IWOB.  Fair aeration bilaterally. CVS:  RRR. Heart sounds normal.  ABD/GI/GU: BS+. Abd soft,  NTND.  MSK/EXT:  Moves extremities.  RLE swelling/edema.  Bulky dressing over right foot.  2+ DP pulse in left foot. SKIN: no apparent skin lesion or wound NEURO: Awake, alert and oriented appropriately.  No apparent focal neuro deficit. PSYCH: Calm. Normal affect.   Procedures:  2/7-aortogram and revascularization with right popliteal and right posterior tibial balloon angioplasty  Microbiology summarized: 2/6-blood cultures NGTD  Assessment and plan: Principal Problem:   Osteomyelitis ([place]) Active Problems:   Diabetes mellitus type 2, noninsulin dependent ([place])   Essential hypertension   Leukocytosis   AKI (acute kidney injury) ([facility])   Lactic acidosis   Obesity (BMI 30-39.9)   Hyperlipidemia   PAD (peripheral artery disease) ([facility])  Right foot and right ankle osteomyelitis: POA. Tenosynovitis/possible abscess or large seroma -Patient was sent here from podiatry office.  History of uncontrolled DM-2 and untreated HLD -MRI concerning for multiple osseous involvement in right foot and right ankle, tenosynovitis and fluid collection concerning for large seroma or abscess.  No fever.  Hemodynamically stable.  Has leukocytosis. -Blood cultures NGTD. -S/p aortogram and revascularization as above -Continue vancomycin, cefepime and Flagyl. -Follow-up further recommendation by vascular surgery. -Podiatry has signed off. -Optimize risk factors.  Started statin. -Sepsis ruled out.  Uncontrolled NIDDM-2 with hyperglycemia and hyperlipidemia: A1c 11.9%. Recent Labs  Lab 03/20/22 1233 03/20/22 1612 03/20/22 2044 03/21/22 0806 03/21/22 1044  GLUCAP 247* 250* 286* 231* 235*  -Started Semglee 10 units daily. -Increase SSI to  moderate -Add NovoLog 3 units 3 times daily with meals -Start Lipitor 40 mg daily -Check lipid panel -Further adjustment as appropriate.  Right lower extremity PAD: s/p revascularization as above.  Denies smoking. -Started statin. -Will start aspirin if no  plan for surgery   AKI: Resolved. Recent Labs    03/19/22 1246 03/20/22 1013 03/21/22 0539  BUN 28* 21 15  CREATININE 1.36* 0.91 0.84   Lactic acidosis: Resolved.  Leukocytosis/bandemia: Improving.   Essential hypertension: Normotensive. -Continue home atenolol. -Hold home chlorthalidone.  Obesity Body mass index is 30.86 kg/m.          DVT prophylaxis:  Place TED hose Start: 03/19/22 1447  Code Status: Full code Family Communication: Updated patient's daughter at bedside Level of care: Telemetry Medical Status is: Inpatient Remains inpatient appropriate because: Right foot osteomyelitis   Final disposition: TBD Consultants:  Podiatry Vascular surgery  55 minutes with more than 50% spent in reviewing records, counseling patient/family and coordinating care.   Sch Meds:  Scheduled Meds:  atenolol  50 mg Oral Daily   atorvastatin  40 mg Oral Daily   fentaNYL       heparin sodium (porcine)       insulin aspart  0-15 Units Subcutaneous TID WC   insulin aspart  0-5 Units Subcutaneous QHS   insulin aspart  3 Units Subcutaneous TID WC   insulin glargine-yfgn  10 Units Subcutaneous Daily   midazolam       nitroGLYCERIN       Continuous Infusions:  ceFAZolin     ceFEPime (MAXIPIME) IV 2 g (03/21/22 0500)   metronidazole 500 mg (03/21/22 0306)   vancomycin 1,000 mg (03/20/22 1742)   PRN Meds:.acetaminophen **OR** acetaminophen, ceFAZolin, fentaNYL, fentaNYL (SUBLIMAZE) injection, heparin sodium (porcine), hydrALAZINE, HYDROmorphone (DILAUDID) injection, midazolam, morphine injection, nitroGLYCERIN, ondansetron **OR** ondansetron (ZOFRAN) IV, ondansetron (ZOFRAN) IV, oxyCODONE-acetaminophen  Antimicrobials: Anti-infectives (From admission, onward)    Start     Dose/Rate Route Frequency Ordered Stop   03/21/22 0727  ceFAZolin (ANCEF) 2-4 GM/100ML-% IVPB       Note to Pharmacy: Maynor, Junie Panning A: cabinet override      03/21/22 0727 03/21/22 1944   03/21/22  0257  ceFAZolin (ANCEF) IVPB 2g/100 mL premix        2 g 200 mL/hr over 30 Minutes Intravenous 30 min pre-op 03/21/22 0257 03/21/22 0900   03/20/22 1800  vancomycin (VANCOCIN) IVPB 1000 mg/200 mL premix        1,000 mg 200 mL/hr over 60 Minutes Intravenous Every 24 hours 03/19/22 1644     03/19/22 1600  ceFEPIme (MAXIPIME) 2 g in sodium chloride 0.9 % 100 mL IVPB        2 g 200 mL/hr over 30 Minutes Intravenous Every 12 hours 03/19/22 1500     03/19/22 1445  vancomycin (VANCOREADY) IVPB 2000 mg/400 mL        2,000 mg 200 mL/hr over 120 Minutes Intravenous  Once 03/19/22 1439 03/19/22 2155   03/19/22 1430  cefTRIAXone (ROCEPHIN) 2 g in sodium chloride 0.9 % 100 mL IVPB  Status:  Discontinued        2 g 200 mL/hr over 30 Minutes Intravenous Every 24 hours 03/19/22 1426 03/19/22 1450   03/19/22 1430  metroNIDAZOLE (FLAGYL) IVPB 500 mg        500 mg 100 mL/hr over 60 Minutes Intravenous Every 12 hours 03/19/22 1426 03/26/22 1429        I have personally reviewed the following labs  and images: CBC: Recent Labs  Lab 03/19/22 1246 03/20/22 1013 03/21/22 0539  WBC 19.2* 14.3* 13.8*  NEUTROABS 16.1*  --   --   HGB 10.6* 8.6* 8.1*  HCT 33.3* 26.4* 24.9*  MCV 83.0 82.5 82.2  PLT 352 283 262   BMP &GFR Recent Labs  Lab 03/19/22 1246 03/20/22 1013 03/21/22 0539  NA 124* 132* 131*  K 5.0 3.8 3.5  CL 88* 104 103  CO2 24 22 22   GLUCOSE 422* 140* 218*  BUN 28* 21 15  CREATININE 1.36* 0.91 0.84  CALCIUM 8.5* 7.6* 7.4*   Estimated Creatinine Clearance: 68.6 mL/min (by C-G formula based on SCr of 0.84 mg/dL). Liver & Pancreas: No results for input(s): "AST", "ALT", "ALKPHOS", "BILITOT", "PROT", "ALBUMIN" in the last 168 hours. No results for input(s): "LIPASE", "AMYLASE" in the last 168 hours. No results for input(s): "AMMONIA" in the last 168 hours. Diabetic: Recent Labs    03/20/22 1013  HGBA1C 11.9*   Recent Labs  Lab 03/20/22 1233 03/20/22 1612 03/20/22 2044  03/21/22 0806 03/21/22 1044  GLUCAP 247* 250* 286* 231* 235*   Cardiac Enzymes: No results for input(s): "CKTOTAL", "CKMB", "CKMBINDEX", "TROPONINI" in the last 168 hours. No results for input(s): "PROBNP" in the last 8760 hours. Coagulation Profile: No results for input(s): "INR", "PROTIME" in the last 168 hours. Thyroid Function Tests: No results for input(s): "TSH", "T4TOTAL", "FREET4", "T3FREE", "THYROIDAB" in the last 72 hours. Lipid Profile: No results for input(s): "CHOL", "HDL", "LDLCALC", "TRIG", "CHOLHDL", "LDLDIRECT" in the last 72 hours. Anemia Panel: No results for input(s): "VITAMINB12", "FOLATE", "FERRITIN", "TIBC", "IRON", "RETICCTPCT" in the last 72 hours. Urine analysis: No results found for: "COLORURINE", "APPEARANCEUR", "LABSPEC", "PHURINE", "GLUCOSEU", "HGBUR", "BILIRUBINUR", "KETONESUR", "PROTEINUR", "UROBILINOGEN", "NITRITE", "LEUKOCYTESUR" Sepsis Labs: Invalid input(s): "PROCALCITONIN", "LACTICIDVEN"  Microbiology: Recent Results (from the past 240 hour(s))  Culture, blood (Routine X 2) w Reflex to ID Panel     Status: None (Preliminary result)   Collection Time: 03/19/22  6:11 PM   Specimen: BLOOD  Result Value Ref Range Status   Specimen Description BLOOD BLOOD RIGHT HAND  Final   Special Requests   Final    BOTTLES DRAWN AEROBIC AND ANAEROBIC Blood Culture adequate volume   Culture   Final    NO GROWTH < 12 HOURS Performed at [facility], [address] [place], [place] [address]    Report Status PENDING  Incomplete  Culture, blood (Routine X 2) w Reflex to ID Panel     Status: None (Preliminary result)   Collection Time: 03/19/22  6:18 PM   Specimen: BLOOD  Result Value Ref Range Status   Specimen Description BLOOD BLOOD LEFT HAND  Final   Special Requests   Final    BOTTLES DRAWN AEROBIC ONLY Blood Culture results may not be optimal due to an inadequate volume of blood received in culture bottles   Culture   Final    NO GROWTH < 12  HOURS Performed at [facility], [address], [place], [place] [address]    Report Status PENDING  Incomplete    Radiology Studies: PERIPHERAL VASCULAR CATHETERIZATION  Result Date: 03/21/2022 See surgical note for result.     Matisyn Cabeza T. D'Hanis  If 7PM-7AM, please contact night-coverage [email or web address] 03/21/2022, 1:16 PM

## 2022-03-21 NOTE — Op Note (Signed)
[facility]  Percutaneous Study/Intervention Procedural Note   Date of Surgery: 03/21/2022  Surgeon(s):Murlin Schrieber    Assistants:none  Pre-operative Diagnosis: PAD with osteomyelitis right lower extremity  Post-operative diagnosis:  Same  Procedure(s) Performed:             1.  Ultrasound guidance for vascular access left femoral artery             2.  Catheter placement into right common femoral artery from left femoral approach             3.  Aortogram and selective right lower extremity angiogram             4.  Percutaneous transluminal angioplasty of right posterior tibial artery with 4 mm Lutonix drug coated angioplasty balloon             5.  Percutaneous transluminal angioplasty of right popliteal artery and proximal TP trunk with 5 mm Lutonix drug coated angioplasty balloon  6.  StarClose closure device left femoral artery  EBL: 5 cc  Contrast: 45 cc  Fluoro Time: 3.4 minutes  Moderate Conscious Sedation Time: approximately 35 minutes using 2 mg of Versed and 50 mcg of Fentanyl              Indications:  Patient is a 77 y.o.female with osteomyelitis and infection in the right foot and ankle area. The patient is brought in for angiography for further evaluation and potential treatment.  Due to the limb threatening nature of the situation, angiogram was performed for attempted limb salvage. The patient is aware that if the procedure fails, amputation would be expected.  The patient also understands that even with successful revascularization, amputation may still be required due to the severity of the situation.  Risks and benefits are discussed and informed consent is obtained.   Procedure:  The patient was identified and appropriate procedural time out was performed.  The patient was then placed supine on the table and prepped and draped in the usual sterile fashion. Moderate conscious sedation was administered during a face to face encounter with the  patient throughout the procedure with my supervision of the RN administering medicines and monitoring the patient's vital signs, pulse oximetry, telemetry and mental status throughout from the start of the procedure until the patient was taken to the recovery room. Ultrasound was used to evaluate the left common femoral artery.  It was patent .  A digital ultrasound image was acquired.  A Seldinger needle was used to access the left common femoral artery under direct ultrasound guidance and a permanent image was performed.  A 0.035 J wire was advanced without resistance and a 5Fr sheath was placed.  Pigtail catheter was placed into the aorta and an AP aortogram was performed. This demonstrated normal renal arteries and normal aorta and iliac segments without significant stenosis. I then crossed the aortic bifurcation and advanced to the right femoral head. Selective right lower extremity angiogram was then performed. This demonstrated normal common femoral artery, profunda femoris artery, and superficial femoral artery with minimal disease.  The popliteal artery was normal above the knee but in the distal popliteal artery and proximal tibioperoneal trunk around the origin of the anterior tibial artery there was a napkin ring like stenosis in the 60 to 70% range.  The anterior tibial artery is very small and diminutive distally.  The peroneal artery was fairly normal.  The posterior tibial artery was the largest tibial vessel and was  actually quite large but did have about an 80% stenosis in the proximal segment and then was patent distally. It was felt that it was in the patient's best interest to proceed with intervention after these images to avoid a second procedure and a larger amount of contrast and fluoroscopy based off of the findings from the initial angiogram. The patient was systemically heparinized and a 6 French 70 cm sheath was then placed over the Terumo Advantage wire. I then used a Kumpe catheter and  the advantage wire to navigate through the popliteal lesion and then into the proximal posterior tibial artery and cross the stenosis without difficulty parking the wire at the ankle.  Given the large size of the vessels, stayed on a 0.035 platform.  A 4 mm diameter by 8 cm length Lutonix drug-coated angioplasty balloon was inflated in the proximal posterior tibial artery and taken up to 10 atm for 1 minute.  A 5 mm diameter by 8 cm length Lutonix drug-coated angioplasty balloon was inflated in the distal popliteal artery and proximal tibioperoneal trunk and taken up to 6 atm for 1 minute.  Completion imaging showed only about a 20% residual stenosis in the popliteal artery and about a 30% residual stenosis in the proximal posterior tibial artery in the area treated.  There was clearly spasm in the area of the posterior tibial artery just distal to the area treated but there was no stenosis there prior and this was clearly vasospasm.  Intra-arterial nitroglycerin was given. I elected to terminate the procedure. The sheath was removed and StarClose closure device was deployed in the left femoral artery with excellent hemostatic result. The patient was taken to the recovery room in stable condition having tolerated the procedure well.  Findings:               Aortogram:  This demonstrated normal renal arteries and normal aorta and iliac segments without significant stenosis.             Right Lower Extremity:  This demonstrated normal common femoral artery, profunda femoris artery, and superficial femoral artery with minimal disease.  The popliteal artery was normal above the knee but in the distal popliteal artery and proximal tibioperoneal trunk around the origin of the anterior tibial artery there was a napkin ring like stenosis in the 60 to 70% range.  The anterior tibial artery is very small and diminutive distally.  The peroneal artery was fairly normal.  The posterior tibial artery was the largest tibial  vessel and was actually quite large but did have about an 80% stenosis in the proximal segment and then was patent distally.   Disposition: Patient was taken to the recovery room in stable condition having tolerated the procedure well.  Complications: None  [name] 03/21/2022 9:01 AM   This note was created with Dragon Medical transcription system. Any errors in dictation are purely unintentional.

## 2022-03-21 NOTE — Interval H&P Note (Signed)
History and Physical Interval Note:  03/21/2022 8:02 AM  Melanie Juarez  has presented today for surgery, with the diagnosis of PAD with Osteomyelitis and Abcess.  The various methods of treatment have been discussed with the patient and family. After consideration of risks, benefits and other options for treatment, the patient has consented to  Procedure(s): Lower Extremity Angiography (Right) as a surgical intervention.  The patient's history has been reviewed, patient examined, no change in status, stable for surgery.  I have reviewed the patient's chart and labs.  Questions were answered to the patient's satisfaction.     [name]

## 2022-03-21 NOTE — Consult Note (Signed)
Pharmacy Antibiotic Note  Melanie Juarez is a 77 y.o. female admitted on 03/19/2022 with  evaluation for potential right foot infection . Patient has a past medical history of diabetes and history of amputation of right great toe due to gangrene 2 years ago and was told to come to the ED after initially going to [facility] as there is concern for osteomyelitis and gangrene.  Pharmacy has been consulted for Vancomycin and Cefepime dosing.  Plan: Change Vancomycin to 1250 mg IV every 24 hours Goal AUC 400-550 Estimated AUC 522.9, Cmin 11.7 Scr 0.84, wt 90.7 kg, Vc used: 0.5 Vancomycin levels at steady state or when clinically appropriate Change Cefepime to 2 grams IV every 8 hours Follow renal function for further adjustments    Height: 5' 7.5" (171.5 cm) Weight: 90.7 kg (199 lb 15.3 oz) IBW/kg (Calculated) : 62.75  Temp (24hrs), Avg:98.8 F (37.1 C), Min:98.5 F (36.9 C), Max:99.4 F (37.4 C)  Recent Labs  Lab 03/19/22 1246 03/19/22 1811 03/19/22 2201 03/20/22 1013 03/21/22 0539  WBC 19.2*  --   --  14.3* 13.8*  CREATININE 1.36*  --   --  0.91 0.84  LATICACIDVEN 1.4 2.6* 1.3 0.8  --      Estimated Creatinine Clearance: 68.6 mL/min (by C-G formula based on SCr of 0.84 mg/dL).    Allergies  Allergen Reactions   Tomato Flavor [Flavoring Agent] Swelling    Antimicrobials this admission: Vancomycin 2/5 >>  Cefepime 2/5 >>  Flagyl 2/5 >>  Dose adjustments this admission: Cefepime 2g q12h to q8h Vancomycin 1g q24h to 1250mg  q24h  Microbiology results: 2/5 BCx: ngtd  Thank you for allowing pharmacy to be a part of this patient's care.  Lorin Picket, PharmD 03/21/2022 1:44 PM

## 2022-03-22 ENCOUNTER — Encounter: Payer: Self-pay | Admitting: Vascular Surgery

## 2022-03-22 ENCOUNTER — Inpatient Hospital Stay: Payer: Medicare Other | Admitting: Anesthesiology

## 2022-03-22 ENCOUNTER — Encounter
Admission: EM | Disposition: A | Discharge status: Skilled Nursing Facility | Payer: Self-pay | Source: Home / Self Care | Attending: Internal Medicine

## 2022-03-22 ENCOUNTER — Other Ambulatory Visit: Payer: Self-pay

## 2022-03-22 DIAGNOSIS — I70261 Atherosclerosis of native arteries of extremities with gangrene, right leg: Secondary | ICD-10-CM

## 2022-03-22 DIAGNOSIS — E669 Obesity, unspecified: Secondary | ICD-10-CM | POA: Diagnosis not present

## 2022-03-22 DIAGNOSIS — N179 Acute kidney failure, unspecified: Secondary | ICD-10-CM | POA: Diagnosis not present

## 2022-03-22 DIAGNOSIS — E119 Type 2 diabetes mellitus without complications: Secondary | ICD-10-CM | POA: Diagnosis not present

## 2022-03-22 DIAGNOSIS — M869 Osteomyelitis, unspecified: Secondary | ICD-10-CM | POA: Diagnosis not present

## 2022-03-22 HISTORY — PX: AMPUTATION: SHX166

## 2022-03-22 LAB — LIPID PANEL
Cholesterol: 147 mg/dL (ref 0–200)
HDL: 24 mg/dL — ABNORMAL LOW (ref >40)
LDL Cholesterol: 107 mg/dL — ABNORMAL HIGH (ref 0–99)
Total CHOL/HDL Ratio: 6.1 RATIO
Triglycerides: 82 mg/dL (ref <150)
VLDL: 16 mg/dL (ref 0–40)

## 2022-03-22 LAB — RENAL FUNCTION PANEL
Albumin: 1.6 g/dL — ABNORMAL LOW (ref 3.5–5.0)
Anion gap: 5 (ref 5–15)
BUN: 16 mg/dL (ref 8–23)
CO2: 23 mmol/L (ref 22–32)
Calcium: 7.4 mg/dL — ABNORMAL LOW (ref 8.9–10.3)
Chloride: 104 mmol/L (ref 98–111)
Creatinine, Ser: 0.76 mg/dL (ref 0.44–1.00)
GFR, Estimated: >60 mL/min (ref >60)
Glucose, Bld: 170 mg/dL — ABNORMAL HIGH (ref 70–99)
Phosphorus: 2.5 mg/dL (ref 2.5–4.6)
Potassium: 3.6 mmol/L (ref 3.5–5.1)
Sodium: 132 mmol/L — ABNORMAL LOW (ref 135–145)

## 2022-03-22 LAB — MAGNESIUM: Magnesium: 1.2 mg/dL — ABNORMAL LOW (ref 1.7–2.4)

## 2022-03-22 LAB — CBC
HCT: 24.8 % — ABNORMAL LOW (ref 36.0–46.0)
Hemoglobin: 8.1 g/dL — ABNORMAL LOW (ref 12.0–15.0)
MCH: 26.8 pg (ref 26.0–34.0)
MCHC: 32.7 g/dL (ref 30.0–36.0)
MCV: 82.1 fL (ref 80.0–100.0)
Platelets: 279 10*3/uL (ref 150–400)
RBC: 3.02 MIL/uL — ABNORMAL LOW (ref 3.87–5.11)
RDW: 14.9 % (ref 11.5–15.5)
WBC: 14.1 10*3/uL — ABNORMAL HIGH (ref 4.0–10.5)
nRBC: 0 % (ref 0.0–0.2)

## 2022-03-22 LAB — SEDIMENTATION RATE: Sed Rate: 70 mm/hr — ABNORMAL HIGH (ref 0–30)

## 2022-03-22 LAB — GLUCOSE, CAPILLARY
Glucose-Capillary: 149 mg/dL — ABNORMAL HIGH (ref 70–99)
Glucose-Capillary: 153 mg/dL — ABNORMAL HIGH (ref 70–99)
Glucose-Capillary: 126 mg/dL — ABNORMAL HIGH (ref 70–99)
Glucose-Capillary: 178 mg/dL — ABNORMAL HIGH (ref 70–99)

## 2022-03-22 LAB — TYPE AND SCREEN
ABO/RH(D): B POS
Antibody Screen: NEGATIVE

## 2022-03-22 LAB — C-REACTIVE PROTEIN: CRP: 15 mg/dL — ABNORMAL HIGH (ref <1.0)

## 2022-03-22 LAB — ABO/RH: ABO/RH(D): B POS

## 2022-03-22 SURGERY — AMPUTATION BELOW KNEE
Anesthesia: General | Site: Knee | Laterality: Right

## 2022-03-22 MED ORDER — PHENYLEPHRINE HCL (PRESSORS) 10 MG/ML IV SOLN
INTRAVENOUS | Status: DC | PRN
  Administered 2022-03-22: 160 ug via INTRAVENOUS
  Administered 2022-03-22 (×2): 80 ug via INTRAVENOUS
  Administered 2022-03-22: 160 ug via INTRAVENOUS
  Administered 2022-03-22 (×2): 80 ug via INTRAVENOUS
  Administered 2022-03-22: 160 ug via INTRAVENOUS

## 2022-03-22 MED ORDER — ROCURONIUM BROMIDE 100 MG/10ML IV SOLN
INTRAVENOUS | Status: DC | PRN
  Administered 2022-03-22: 30 mg via INTRAVENOUS

## 2022-03-22 MED ORDER — ONDANSETRON HCL 4 MG/2ML IJ SOLN
INTRAMUSCULAR | Status: DC | PRN
  Administered 2022-03-22: 4 mg via INTRAVENOUS

## 2022-03-22 MED ORDER — MORPHINE SULFATE (PF) 2 MG/ML IV SOLN
2.0000 mg | INTRAVENOUS | Status: DC | PRN
  Administered 2022-03-26: 2 mg via INTRAVENOUS
  Filled 2022-03-22 (×2): qty 1

## 2022-03-22 MED ORDER — LIDOCAINE HCL (CARDIAC) PF 100 MG/5ML IV SOSY
PREFILLED_SYRINGE | INTRAVENOUS | Status: DC | PRN
  Administered 2022-03-22: 50 mg via INTRAVENOUS

## 2022-03-22 MED ORDER — GLYCOPYRROLATE 0.2 MG/ML IJ SOLN
INTRAMUSCULAR | Status: DC | PRN
  Administered 2022-03-22: .1 mg via INTRAVENOUS

## 2022-03-22 MED ORDER — SUGAMMADEX SODIUM 200 MG/2ML IV SOLN
INTRAVENOUS | Status: DC | PRN
  Administered 2022-03-22: 200 mg via INTRAVENOUS

## 2022-03-22 MED ORDER — FENTANYL CITRATE (PF) 100 MCG/2ML IJ SOLN: 25.0000 ug | INTRAMUSCULAR | Status: DC | PRN

## 2022-03-22 MED ORDER — FENTANYL CITRATE (PF) 100 MCG/2ML IJ SOLN
INTRAMUSCULAR | Status: DC | PRN
  Administered 2022-03-22 (×2): 50 ug via INTRAVENOUS

## 2022-03-22 MED ORDER — DEXMEDETOMIDINE HCL IN NACL 80 MCG/20ML IV SOLN
INTRAVENOUS | Status: DC | PRN
  Administered 2022-03-22: 8 ug via BUCCAL
  Administered 2022-03-22: 4 ug via BUCCAL

## 2022-03-22 MED ORDER — MIDAZOLAM HCL 2 MG/2ML IJ SOLN
INTRAMUSCULAR | Status: AC
  Filled 2022-03-22: qty 2

## 2022-03-22 MED ORDER — FENTANYL CITRATE (PF) 100 MCG/2ML IJ SOLN
INTRAMUSCULAR | Status: AC
  Filled 2022-03-22: qty 2

## 2022-03-22 MED ORDER — HYDROMORPHONE HCL 1 MG/ML IJ SOLN: 1.0000 mg | Freq: Once | INTRAMUSCULAR | Status: DC | PRN

## 2022-03-22 MED ORDER — HEPARIN SODIUM (PORCINE) 5000 UNIT/ML IJ SOLN
INTRAMUSCULAR | Status: AC
  Filled 2022-03-22: qty 1

## 2022-03-22 MED ORDER — ACETAMINOPHEN 10 MG/ML IV SOLN
INTRAVENOUS | Status: DC | PRN
  Administered 2022-03-22: 1000 mg via INTRAVENOUS

## 2022-03-22 MED ORDER — OXYCODONE HCL 5 MG PO TABS: 5.0000 mg | ORAL_TABLET | Freq: Once | ORAL | Status: DC | PRN

## 2022-03-22 MED ORDER — OXYCODONE HCL 5 MG/5ML PO SOLN: 5.0000 mg | Freq: Once | ORAL | Status: DC | PRN

## 2022-03-22 MED ORDER — PROPOFOL 10 MG/ML IV BOLUS
INTRAVENOUS | Status: DC | PRN
  Administered 2022-03-22: 130 mg via INTRAVENOUS
  Administered 2022-03-22: 40 mg via INTRAVENOUS

## 2022-03-22 MED ORDER — SODIUM CHLORIDE 0.9 % IV SOLN: INTRAVENOUS | Status: DC | PRN

## 2022-03-22 MED ORDER — HYDROMORPHONE HCL 1 MG/ML IJ SOLN
INTRAMUSCULAR | Status: DC | PRN
  Administered 2022-03-22: .5 mg via INTRAVENOUS

## 2022-03-22 MED ORDER — MAGNESIUM SULFATE 4 GM/100ML IV SOLN
4.0000 g | Freq: Once | INTRAVENOUS | Status: AC
  Administered 2022-03-22: 4 g via INTRAVENOUS
  Filled 2022-03-22: qty 100

## 2022-03-22 MED ORDER — SODIUM CHLORIDE 0.9 % IV SOLN
12.5000 mg | Freq: Four times a day (QID) | INTRAVENOUS | Status: DC | PRN
  Filled 2022-03-22: qty 0.5

## 2022-03-22 MED ORDER — FAMOTIDINE 20 MG PO TABS: 40.0000 mg | ORAL_TABLET | Freq: Once | ORAL | Status: DC | PRN

## 2022-03-22 MED ORDER — ONDANSETRON HCL 4 MG/2ML IJ SOLN: 4.0000 mg | Freq: Once | INTRAMUSCULAR | Status: DC | PRN

## 2022-03-22 MED ORDER — MIDAZOLAM HCL 2 MG/2ML IJ SOLN
INTRAMUSCULAR | Status: DC | PRN
  Administered 2022-03-22: 1 mg via INTRAVENOUS

## 2022-03-22 MED ORDER — VANCOMYCIN HCL 1000 MG IV SOLR
INTRAVENOUS | Status: DC | PRN
  Administered 2022-03-22: 1000 mg via TOPICAL

## 2022-03-22 MED ORDER — FENTANYL CITRATE PF 50 MCG/ML IJ SOSY: 12.5000 ug | PREFILLED_SYRINGE | Freq: Once | INTRAMUSCULAR | Status: DC | PRN

## 2022-03-22 MED ORDER — VANCOMYCIN HCL 1000 MG IV SOLR
INTRAVENOUS | Status: AC
  Filled 2022-03-22: qty 20

## 2022-03-22 MED ORDER — KETAMINE HCL 50 MG/5ML IJ SOSY
PREFILLED_SYRINGE | INTRAMUSCULAR | Status: AC
  Filled 2022-03-22: qty 5

## 2022-03-22 MED ORDER — CEFAZOLIN SODIUM-DEXTROSE 2-3 GM-%(50ML) IV SOLR
INTRAVENOUS | Status: DC | PRN
  Administered 2022-03-22: 2 g via INTRAVENOUS

## 2022-03-22 MED ORDER — ONDANSETRON HCL 4 MG/2ML IJ SOLN
4.0000 mg | Freq: Four times a day (QID) | INTRAMUSCULAR | Status: DC | PRN
  Administered 2022-03-22 – 2022-03-27 (×4): 4 mg via INTRAVENOUS
  Filled 2022-03-22: qty 2

## 2022-03-22 MED ORDER — METHYLPREDNISOLONE SODIUM SUCC 125 MG IJ SOLR: 125.0000 mg | Freq: Once | INTRAMUSCULAR | Status: DC | PRN

## 2022-03-22 MED ORDER — DIPHENHYDRAMINE HCL 50 MG/ML IJ SOLN: 50.0000 mg | Freq: Once | INTRAMUSCULAR | Status: DC | PRN

## 2022-03-22 MED ORDER — 0.9 % SODIUM CHLORIDE (POUR BTL) OPTIME
TOPICAL | Status: DC | PRN
  Administered 2022-03-22: 1000 mL

## 2022-03-22 MED ORDER — PHENYLEPHRINE HCL-NACL 20-0.9 MG/250ML-% IV SOLN
INTRAVENOUS | Status: DC | PRN
  Administered 2022-03-22: 160 ug via INTRAVENOUS

## 2022-03-22 MED ORDER — HYDROMORPHONE HCL 1 MG/ML IJ SOLN
INTRAMUSCULAR | Status: AC
  Filled 2022-03-22: qty 1

## 2022-03-22 MED ORDER — KETAMINE HCL 10 MG/ML IJ SOLN
INTRAMUSCULAR | Status: DC | PRN
  Administered 2022-03-22: 10 mg via INTRAVENOUS
  Administered 2022-03-22: 20 mg via INTRAVENOUS
  Administered 2022-03-22: 10 mg via INTRAVENOUS

## 2022-03-22 MED ORDER — MIDAZOLAM HCL 2 MG/ML PO SYRP: 8.0000 mg | ORAL_SOLUTION | Freq: Once | ORAL | Status: DC | PRN

## 2022-03-22 MED ORDER — DEXAMETHASONE SODIUM PHOSPHATE 10 MG/ML IJ SOLN
INTRAMUSCULAR | Status: DC | PRN
  Administered 2022-03-22: 5 mg via INTRAVENOUS

## 2022-03-22 SURGICAL SUPPLY — 35 items
BLADE SAGITTAL WIDE XTHICK NO (BLADE) IMPLANT
BNDG COHESIVE 4X5 TAN STRL LF (GAUZE/BANDAGES/DRESSINGS) ×1 IMPLANT
BNDG ELASTIC 4X5.8 VLCR STR LF (GAUZE/BANDAGES/DRESSINGS) IMPLANT
BNDG GAUZE DERMACEA FLUFF 4 (GAUZE/BANDAGES/DRESSINGS) ×2 IMPLANT
BRUSH SCRUB EZ  4% CHG (MISCELLANEOUS) ×1
BRUSH SCRUB EZ 4% CHG (MISCELLANEOUS) ×1 IMPLANT
CHLORAPREP W/TINT 26 (MISCELLANEOUS) ×1 IMPLANT
DRAPE INCISE IOBAN 66X45 STRL (DRAPES) IMPLANT
ELECT REM PT RETURN 9FT ADLT (ELECTROSURGICAL) ×1
ELECTRODE REM PT RTRN 9FT ADLT (ELECTROSURGICAL) ×1 IMPLANT
GLOVE BIO SURGEON STRL SZ7 (GLOVE) ×2 IMPLANT
GOWN STRL REUS W/ TWL LRG LVL3 (GOWN DISPOSABLE) ×2 IMPLANT
GOWN STRL REUS W/ TWL XL LVL3 (GOWN DISPOSABLE) ×1 IMPLANT
GOWN STRL REUS W/TWL LRG LVL3 (GOWN DISPOSABLE) ×2
GOWN STRL REUS W/TWL XL LVL3 (GOWN DISPOSABLE) ×1
HANDLE YANKAUER SUCT BULB TIP (MISCELLANEOUS) ×1 IMPLANT
KIT STIMULAN RAPID CURE 5CC (Orthopedic Implant) IMPLANT
KIT TURNOVER KIT A (KITS) ×1 IMPLANT
MANIFOLD NEPTUNE II (INSTRUMENTS) ×1 IMPLANT
MAT ABSORB  FLUID 56X50 GRAY (MISCELLANEOUS) ×1
MAT ABSORB FLUID 56X50 GRAY (MISCELLANEOUS) ×1 IMPLANT
NS IRRIG 1000ML POUR BTL (IV SOLUTION) ×1 IMPLANT
PACK EXTREMITY ARMC (MISCELLANEOUS) ×1 IMPLANT
PAD PREP 24X41 OB/GYN DISP (PERSONAL CARE ITEMS) ×1 IMPLANT
SPONGE T-LAP 18X18 ~~LOC~~+RFID (SPONGE) ×1 IMPLANT
STAPLER SKIN PROX 35W (STAPLE) ×1 IMPLANT
STOCKINETTE M/LG 89821 (MISCELLANEOUS) ×1 IMPLANT
SUT SILK 2 0 (SUTURE) ×1
SUT SILK 2 0 SH (SUTURE) ×2 IMPLANT
SUT SILK 2-0 18XBRD TIE 12 (SUTURE) ×1 IMPLANT
SUT SILK 3 0 (SUTURE) ×1
SUT SILK 3-0 18XBRD TIE 12 (SUTURE) ×1 IMPLANT
SUT VIC AB 0 CT1 36 (SUTURE) ×2 IMPLANT
SUT VIC AB 2-0 CT1 (SUTURE) ×2 IMPLANT
TRAP FLUID SMOKE EVACUATOR (MISCELLANEOUS) ×1 IMPLANT

## 2022-03-22 NOTE — Progress Notes (Signed)
PROGRESS NOTE  Melanie Juarez ZOX:[id number] DOB: 03-17-1945   PCP: System, Provider Not In  Patient is from: Home.  Lives alone.  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  Blood cultures obtained.  Patient was started on broad-spectrum antibiotics.  Podiatry consulted and recommended vascular surgery evaluation.  Patient underwent aortogram and revascularization with right popliteal and right posterior tibial balloon angioplasty on 03/21/2022.  Blood cultures NGTD.  Remains on broad-spectrum antibiotics.  Plan for right BKA on 2/8.    Subjective: Seen and examined earlier this morning.  No major events overnight of this morning.  No complaints.  Pain fairly controlled.  Anxious about the surgery.  Objective: Vitals:   03/21/22 1921 03/22/22 0439 03/22/22 0934 03/22/22 1209  BP: (!) 114/49 (!) 129/45 (!) 140/61 (!) 148/64  Pulse: 80 92 92 95  Resp: 18 17 16 16   Temp: 98.8 F (37.1 C) 99.7 F (37.6 C) 98.1 F (36.7 C) 98.6 F (37 C)  TempSrc: Oral Oral Oral Temporal  SpO2: 100% 99% 100% 97%  Weight:    90.7 kg  Height:    5' 7.5" (1.715 m)    Examination:  GENERAL: No apparent distress.  Nontoxic. HEENT: MMM.  Vision and hearing grossly intact.  NECK: Supple.  No apparent JVD.  RESP:  No IWOB.  Fair aeration bilaterally. CVS:  RRR. Heart sounds normal.  ABD/GI/GU: BS+. Abd soft, NTND.   MSK/EXT:  RLE swelling/edema.  Bulky dressing over right foot.  2+ DP pulse in left foot. SKIN: no apparent skin lesion or wound NEURO: Awake and alert. Oriented appropriately.  No apparent focal neuro deficit. PSYCH: Calm. Normal affect.   Procedures:  2/7-aortogram and revascularization with right popliteal and right posterior tibial balloon angioplasty  Microbiology summarized: 2/6-blood cultures NGTD  Assessment and plan: Principal Problem:   Osteomyelitis ([place]) Active Problems:   Diabetes mellitus type 2, noninsulin dependent ([place])   Essential hypertension   Leukocytosis   AKI (acute kidney injury) ([facility])   Lactic acidosis   Obesity (BMI 30-39.9)   Hyperlipidemia   PAD (peripheral artery disease) ([facility])  Right foot and right ankle osteomyelitis: POA. Tenosynovitis/possible abscess or large seroma -Admitted from podiatry office.  History of uncontrolled DM-2 and untreated HLD.  CRP 15.  ESR 70. -MRI concerning for multiple osseous involvement in right foot and right ankle, tenosynovitis and fluid collection concerning for large seroma or abscess.  No fever.  Hemodynamically stable.  Has leukocytosis. -Blood cultures NGTD. -S/p aortogram and revascularization as above -Plan for right BKA by vascular surgery today. -Continue vancomycin, cefepime and Flagyl. -Podiatry has signed off. -Optimize risk factors.  Started statin. -Sepsis ruled out.  Uncontrolled NIDDM-2 with hyperglycemia and hyperlipidemia: A1c 11.9%.  LDL 107.  Hyperglycemia improved. Recent Labs  Lab 03/21/22 1415 03/21/22 1719 03/21/22 2041 03/22/22 0930 03/22/22 1155  GLUCAP 310* 276* 187* 149* 153*  -Started Semglee 10 units daily. -  Continue SSI-moderate. -Continue NovoLog 3 units 3 times daily with meals -Continue Lipitor 40 mg daily -Further adjustment as appropriate.  Right lower extremity PAD: s/p revascularization as above.  Denies smoking. -Lipitor 40 mg daily -Will start low-dose aspirin  after surgery once okay from surgical standpoint   AKI: Resolved. Recent Labs    03/19/22 1246 03/20/22 1013 03/21/22 0539 03/22/22 0459  BUN 28* 21 15 16   CREATININE 1.36* 0.91 0.84 0.76   Lactic acidosis: Resolved.  Leukocytosis/bandemia: Improving.   Essential hypertension: BP slightly elevated. -Continue home atenolol. -Hold home chlorthalidone.  Obesity Body mass index is 30.86 kg/m.          DVT prophylaxis:  Place TED hose Start: 03/19/22 1447  Code Status: Full code Family Communication: None at bedside today. Level of care: Med-Surg Status is: Inpatient Remains inpatient appropriate because: Right foot osteomyelitis   Final disposition: TBD Consultants:  Podiatry-signed off Vascular surgery  35 minutes with more than 50% spent in reviewing records, counseling patient/family and coordinating care.   Sch Meds:  Scheduled Meds:  [MAR Hold] atenolol  50 mg Oral Daily   [MAR Hold] atorvastatin  40 mg Oral Daily   [MAR Hold] insulin aspart  0-15 Units Subcutaneous TID WC   [MAR Hold] insulin aspart  0-5 Units Subcutaneous QHS   [MAR Hold] insulin aspart  3 Units Subcutaneous TID WC   [MAR Hold] insulin glargine-yfgn  10 Units Subcutaneous Daily   Continuous Infusions:  sodium chloride 75 mL/hr at 03/21/22 2147   [facility] Hold] ceFEPime (MAXIPIME) IV 2 g (03/22/22 0613)   [MAR Hold] magnesium sulfate bolus IVPB     [MAR Hold] metronidazole 500 mg (03/22/22 0135)   [MAR Hold] vancomycin 1,250 mg (03/21/22 1721)   PRN Meds:.[MAR Hold] acetaminophen **OR** [MAR Hold] acetaminophen, [MAR Hold] fentaNYL (SUBLIMAZE) injection, [MAR Hold] hydrALAZINE, [MAR Hold]  HYDROmorphone (DILAUDID) injection, [MAR Hold]  morphine injection, [MAR Hold] ondansetron **OR** [MAR Hold] ondansetron (ZOFRAN) IV, [MAR Hold] ondansetron (ZOFRAN) IV, [MAR Hold] oxyCODONE-acetaminophen  Antimicrobials: Anti-infectives (From admission, onward)    Start     Dose/Rate Route Frequency  Ordered Stop   03/21/22 1800  [MAR Hold]  vancomycin (VANCOREADY) IVPB 1250 mg/250 mL        (MAR Hold since Thu 03/22/2022 at 1207.Hold Reason: Transfer to a Procedural area)   1,250 mg 166.7 mL/hr over 90 Minutes Intravenous Every 24 hours 03/21/22 1350     03/21/22 1500  [MAR Hold]  ceFEPIme (MAXIPIME) 2 g in sodium chloride 0.9 % 100 mL IVPB        (MAR Hold since Thu 03/22/2022 at 1207.Hold Reason: Transfer to a Procedural area)   2 g 200 mL/hr over 30 Minutes Intravenous Every 8 hours 03/21/22 1350     03/21/22 0727  ceFAZolin (ANCEF) 2-4 GM/100ML-% IVPB       Note to Pharmacy: Maynor, Junie Panning A: cabinet override      03/21/22 0727 03/21/22 1944   03/21/22 0257  ceFAZolin (ANCEF) IVPB 2g/100 mL premix        2 g 200 mL/hr over 30 Minutes Intravenous 30 min pre-op 03/21/22 0257 03/22/22 0731   03/20/22 1800  vancomycin (VANCOCIN) IVPB 1000 mg/200 mL premix  Status:  Discontinued        1,000 mg 200 mL/hr over 60 Minutes Intravenous Every 24 hours 03/19/22 1644 03/21/22 1350   03/19/22 1600  ceFEPIme (MAXIPIME) 2 g in sodium chloride 0.9 % 100 mL IVPB  Status:  Discontinued  2 g 200 mL/hr over 30 Minutes Intravenous Every 12 hours 03/19/22 1500 03/21/22 1350   03/19/22 1445  vancomycin (VANCOREADY) IVPB 2000 mg/400 mL        2,000 mg 200 mL/hr over 120 Minutes Intravenous  Once 03/19/22 1439 03/19/22 2155   03/19/22 1430  cefTRIAXone (ROCEPHIN) 2 g in sodium chloride 0.9 % 100 mL IVPB  Status:  Discontinued        2 g 200 mL/hr over 30 Minutes Intravenous Every 24 hours 03/19/22 1426 03/19/22 1450   03/19/22 1430  [MAR Hold]  metroNIDAZOLE (FLAGYL) IVPB 500 mg        (MAR Hold since Thu 03/22/2022 at 1207.Hold Reason: Transfer to a Procedural area)   500 mg 100 mL/hr over 60 Minutes Intravenous Every 12 hours 03/19/22 1426 03/26/22 1429        I have personally reviewed the following labs and images: CBC: Recent Labs  Lab 03/19/22 1246 03/20/22 1013 03/21/22 0539  03/22/22 0459  WBC 19.2* 14.3* 13.8* 14.1*  NEUTROABS 16.1*  --   --   --   HGB 10.6* 8.6* 8.1* 8.1*  HCT 33.3* 26.4* 24.9* 24.8*  MCV 83.0 82.5 82.2 82.1  PLT 352 283 262 279   BMP &GFR Recent Labs  Lab 03/19/22 1246 03/20/22 1013 03/21/22 0539 03/22/22 0459  NA 124* 132* 131* 132*  K 5.0 3.8 3.5 3.6  CL 88* 104 103 104  CO2 24 22 22 23   GLUCOSE 422* 140* 218* 170*  BUN 28* 21 15 16   CREATININE 1.36* 0.91 0.84 0.76  CALCIUM 8.5* 7.6* 7.4* 7.4*  MG  --   --   --  1.2*  PHOS  --   --   --  2.5   Estimated Creatinine Clearance: 72.1 mL/min (by C-G formula based on SCr of 0.76 mg/dL). Liver & Pancreas: Recent Labs  Lab 03/22/22 0459  ALBUMIN 1.6*   No results for input(s): "LIPASE", "AMYLASE" in the last 168 hours. No results for input(s): "AMMONIA" in the last 168 hours. Diabetic: Recent Labs    03/20/22 1013  HGBA1C 11.9*   Recent Labs  Lab 03/21/22 1415 03/21/22 1719 03/21/22 2041 03/22/22 0930 03/22/22 1155  GLUCAP 310* 276* 187* 149* 153*   Cardiac Enzymes: No results for input(s): "CKTOTAL", "CKMB", "CKMBINDEX", "TROPONINI" in the last 168 hours. No results for input(s): "PROBNP" in the last 8760 hours. Coagulation Profile: No results for input(s): "INR", "PROTIME" in the last 168 hours. Thyroid Function Tests: No results for input(s): "TSH", "T4TOTAL", "FREET4", "T3FREE", "THYROIDAB" in the last 72 hours. Lipid Profile: Recent Labs    03/22/22 0459  CHOL 147  HDL 24*  LDLCALC 107*  TRIG 82  CHOLHDL 6.1   Anemia Panel: No results for input(s): "VITAMINB12", "FOLATE", "FERRITIN", "TIBC", "IRON", "RETICCTPCT" in the last 72 hours. Urine analysis: No results found for: "COLORURINE", "APPEARANCEUR", "LABSPEC", "PHURINE", "GLUCOSEU", "HGBUR", "BILIRUBINUR", "KETONESUR", "PROTEINUR", "UROBILINOGEN", "NITRITE", "LEUKOCYTESUR" Sepsis Labs: Invalid input(s): "PROCALCITONIN", "LACTICIDVEN"  Microbiology: Recent Results (from the past 240 hour(s))   Culture, blood (Routine X 2) w Reflex to ID Panel     Status: None (Preliminary result)   Collection Time: 03/19/22  6:11 PM   Specimen: BLOOD  Result Value Ref Range Status   Specimen Description BLOOD BLOOD RIGHT HAND  Final   Special Requests   Final    BOTTLES DRAWN AEROBIC AND ANAEROBIC Blood Culture adequate volume   Culture   Final    NO GROWTH 3 DAYS Performed at  [facility], [address], [place], [place] [address]    Report Status PENDING  Incomplete  Culture, blood (Routine X 2) w Reflex to ID Panel     Status: None (Preliminary result)   Collection Time: 03/19/22  6:18 PM   Specimen: BLOOD  Result Value Ref Range Status   Specimen Description BLOOD BLOOD LEFT HAND  Final   Special Requests   Final    BOTTLES DRAWN AEROBIC ONLY Blood Culture results may not be optimal due to an inadequate volume of blood received in culture bottles   Culture   Final    NO GROWTH 3 DAYS Performed at [facility], [address], [place], [place] [address]    Report Status PENDING  Incomplete    Radiology Studies: No results found.    Lumir Demetriou T. Seva Chancy [facility]  If 7PM-7AM, please contact night-coverage [email or web address] 03/22/2022, 1:35 PM

## 2022-03-22 NOTE — Anesthesia Procedure Notes (Signed)
Procedure Name: Intubation Date/Time: 03/22/2022 3:17 PM  Performed by: Otho Perl, CRNAPre-anesthesia Checklist: Patient identified, Patient being monitored, Timeout performed, Emergency Drugs available and Suction available Patient Re-evaluated:Patient Re-evaluated prior to induction Oxygen Delivery Method: Circle system utilized Preoxygenation: Pre-oxygenation with 100% oxygen Induction Type: IV induction Ventilation: Mask ventilation without difficulty Laryngoscope Size: McGraph and 4 Grade View: Grade II Tube type: Oral Tube size: 7.0 mm Number of attempts: 1 Airway Equipment and Method: Stylet Placement Confirmation: ETT inserted through vocal cords under direct vision, positive ETCO2 and breath sounds checked- equal and bilateral Secured at: 19 cm Tube secured with: Tape Dental Injury: Teeth and Oropharynx as per pre-operative assessment

## 2022-03-22 NOTE — Transfer of Care (Signed)
Immediate Anesthesia Transfer of Care Note  Patient: Melanie Juarez  Procedure(s) Performed: AMPUTATION BELOW KNEE-Guillotine (Right: Knee)  Patient Location: PACU  Anesthesia Type:General  Level of Consciousness: awake  Airway & Oxygen Therapy: Patient Spontanous Breathing and Patient connected to face mask oxygen  Post-op Assessment: Report given to RN and Post -op Vital signs reviewed and stable  Post vital signs: Reviewed  Last Vitals:  Vitals Value Taken Time  BP 133/53 03/22/22 1616  Temp 36.8 C 03/22/22 1615  Pulse 80 03/22/22 1623  Resp 17 03/22/22 1623  SpO2 99 % 03/22/22 1623  Vitals shown include unvalidated device data.  Last Pain:  Vitals:   03/22/22 1209  TempSrc: Temporal  PainSc: 0-No pain      Patients Stated Pain Goal: 0 (95/18/84 1660)  Complications: No notable events documented.

## 2022-03-22 NOTE — Interval H&P Note (Signed)
History and Physical Interval Note:  03/22/2022 1:42 PM  Melanie Juarez  has presented today for surgery, with the diagnosis of Right Leg gangrene.  The various methods of treatment have been discussed with the patient and family. After consideration of risks, benefits and other options for treatment, the patient has consented to  Procedure(s): AMPUTATION BELOW KNEE-Guillotine (Right) as a surgical intervention.  The patient's history has been reviewed, patient examined, no change in status, stable for surgery.  I have reviewed the patient's chart and labs.  Questions were answered to the patient's satisfaction.     [name]

## 2022-03-22 NOTE — Op Note (Signed)
   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  SPECIMEN(S):  Right below-the-knee amputation  INDICATIONS:   Melanie Juarez is a 77 y.o. female who presents with right leg gangrene.  The patient is scheduled for a right below-the-knee amputation.  I discussed in depth with the patient the risks, benefits, and alternatives to this procedure.  The patient is aware that the risk of this operation included but are not limited to:  bleeding, infection, myocardial infarction, stroke, death, failure to heal amputation wound, and possible need for more proximal amputation.  The patient is aware of the risks and agrees proceed forward with the procedure. An assistant was present during the procedure to help facilitate the exposure and expedite the procedure.   DESCRIPTION:  After full informed written consent was obtained from the patient, the patient was brought back to the operating room, and placed supine upon the operating table.  Prior to induction, the patient received IV antibiotics. The assistant provided retraction and mobilization to help facilitate exposure and expedite the procedure throughout the entire procedure.  This included following suture, using retractors, and optimizing lighting.  The patient was then prepped and draped in the standard fashion for a below-the-knee amputation.  After obtaining adequate anesthesia, the patient was prepped and draped in the standard fashion for a right below-the-knee amputation.  A circular incision was made just below the belly of the calf muscles several cm above the ankle.  The dissection was taken down to the  bone with a scalpel and electrocautery. I then transected the tibia and the fibula with the power saw and completed the posterior flap with the amputation knife.  I then used 2-0 silk suture ligatures for the vessels and hemostasis was achieved with this and cautery.  The wound was irrigated and antibiotic beads were placed and then a VAC dressing was placed.  The patient was taken to the recovery room in stable condition.   COMPLICATIONS: none  CONDITION: stable   [name]  03/22/2022, 4:04 PM    This note was created with Dragon Medical transcription system. Any errors in dictation are purely unintentional.

## 2022-03-22 NOTE — Anesthesia Preprocedure Evaluation (Addendum)
Anesthesia Evaluation  Patient identified by MRN, date of birth, ID band Patient awake    Reviewed: Allergy & Precautions, NPO status , Patient's Chart, lab work & pertinent test results  History of Anesthesia Complications Negative for: history of anesthetic complications  Airway Mallampati: II  TM Distance: >3 FB Neck ROM: full    Dental  (+) Chipped, Poor Dentition   Pulmonary neg pulmonary ROS   Pulmonary exam normal        Cardiovascular hypertension, On Medications + Peripheral Vascular Disease  Normal cardiovascular exam     Neuro/Psych negative neurological ROS  negative psych ROS   GI/Hepatic negative GI ROS, Neg liver ROS,,,  Endo/Other  diabetes, Poorly Controlled, Oral Hypoglycemic Agents    Renal/GU   negative genitourinary   Musculoskeletal Osteomyelitis    Abdominal   Peds  Hematology negative hematology ROS (+)   Anesthesia Other Findings Past Medical History: No date: Diabetes ([place]) No date: Hypertension  Past Surgical History: 03/21/2022: LOWER EXTREMITY ANGIOGRAPHY; Right     Comment:  Procedure: Lower Extremity Angiography;  Surgeon: Algernon Huxley, MD;  Location: [facility];  Service:               Cardiovascular;  Laterality: Right;  BMI    Body Mass Index: 30.86 kg/m      Reproductive/Obstetrics negative OB ROS                             Anesthesia Physical Anesthesia Plan  ASA: 4  Anesthesia Plan: General ETT   Post-op Pain Management: Toradol IV (intra-op)*, Ofirmev IV (intra-op)* and Dilaudid IV   Induction: Intravenous  PONV Risk Score and Plan: Ondansetron, Dexamethasone, Midazolam and Treatment may vary due to age or medical condition  Airway Management Planned: Oral ETT  Additional Equipment:   Intra-op Plan:   Post-operative Plan: Extubation in OR  Informed Consent: I have reviewed the patients History and  Physical, chart, labs and discussed the procedure including the risks, benefits and alternatives for the proposed anesthesia with the patient or authorized representative who has indicated his/her understanding and acceptance.     Dental Advisory Given  Plan Discussed with: Anesthesiologist, CRNA and Surgeon  Anesthesia Plan Comments: (Patient consented for risks of anesthesia including but not limited to:  - adverse reactions to medications - damage to eyes, teeth, lips or other oral mucosa - nerve damage due to positioning  - sore throat or hoarseness - Damage to heart, brain, nerves, lungs, other parts of body or loss of life  Patient voiced understanding.)        Anesthesia Quick Evaluation

## 2022-03-23 ENCOUNTER — Encounter: Payer: Self-pay | Admitting: Vascular Surgery

## 2022-03-23 DIAGNOSIS — E669 Obesity, unspecified: Secondary | ICD-10-CM | POA: Diagnosis not present

## 2022-03-23 DIAGNOSIS — N179 Acute kidney failure, unspecified: Secondary | ICD-10-CM | POA: Diagnosis not present

## 2022-03-23 DIAGNOSIS — E119 Type 2 diabetes mellitus without complications: Secondary | ICD-10-CM | POA: Diagnosis not present

## 2022-03-23 DIAGNOSIS — M869 Osteomyelitis, unspecified: Secondary | ICD-10-CM | POA: Diagnosis not present

## 2022-03-23 LAB — CBC
HCT: 25.2 % — ABNORMAL LOW (ref 36.0–46.0)
Hemoglobin: 8.1 g/dL — ABNORMAL LOW (ref 12.0–15.0)
MCH: 26.6 pg (ref 26.0–34.0)
MCHC: 32.1 g/dL (ref 30.0–36.0)
MCV: 82.6 fL (ref 80.0–100.0)
Platelets: 309 10*3/uL (ref 150–400)
RBC: 3.05 MIL/uL — ABNORMAL LOW (ref 3.87–5.11)
RDW: 15.5 % (ref 11.5–15.5)
WBC: 14.7 10*3/uL — ABNORMAL HIGH (ref 4.0–10.5)
nRBC: 0 % (ref 0.0–0.2)

## 2022-03-23 LAB — GLUCOSE, CAPILLARY
Glucose-Capillary: 182 mg/dL — ABNORMAL HIGH (ref 70–99)
Glucose-Capillary: 245 mg/dL — ABNORMAL HIGH (ref 70–99)
Glucose-Capillary: 193 mg/dL — ABNORMAL HIGH (ref 70–99)
Glucose-Capillary: 127 mg/dL — ABNORMAL HIGH (ref 70–99)

## 2022-03-23 LAB — VANCOMYCIN, PEAK: Vancomycin Pk: 31 ug/mL (ref 30–40)

## 2022-03-23 MED ORDER — ENOXAPARIN SODIUM 30 MG/0.3ML IJ SOSY
30.0000 mg | PREFILLED_SYRINGE | INTRAMUSCULAR | Status: DC
  Administered 2022-03-23: 30 mg via SUBCUTANEOUS
  Filled 2022-03-23: qty 0.3

## 2022-03-23 MED ORDER — INSULIN GLARGINE-YFGN 100 UNIT/ML ~~LOC~~ SOLN: 20.0000 [IU] | Freq: Every day | SUBCUTANEOUS | Status: DC

## 2022-03-23 MED ORDER — ENSURE MAX PROTEIN PO LIQD
11.0000 [oz_av] | Freq: Two times a day (BID) | ORAL | Status: DC
  Administered 2022-03-23 – 2022-03-28 (×6): 11 [oz_av] via ORAL
  Filled 2022-03-23: qty 330

## 2022-03-23 MED ORDER — INSULIN GLARGINE-YFGN 100 UNIT/ML ~~LOC~~ SOLN
10.0000 [IU] | Freq: Once | SUBCUTANEOUS | Status: AC
  Administered 2022-03-23: 10 [IU] via SUBCUTANEOUS
  Filled 2022-03-23: qty 0.1

## 2022-03-23 MED ORDER — INSULIN GLARGINE-YFGN 100 UNIT/ML ~~LOC~~ SOLN
20.0000 [IU] | Freq: Every day | SUBCUTANEOUS | Status: DC
  Administered 2022-03-24 – 2022-03-26 (×3): 20 [IU] via SUBCUTANEOUS
  Filled 2022-03-23 (×3): qty 0.2

## 2022-03-23 MED ORDER — INSULIN ASPART 100 UNIT/ML IJ SOLN
5.0000 [IU] | Freq: Three times a day (TID) | INTRAMUSCULAR | Status: DC
  Administered 2022-03-23 – 2022-03-24 (×3): 5 [IU] via SUBCUTANEOUS
  Filled 2022-03-23 (×3): qty 1

## 2022-03-23 NOTE — Progress Notes (Signed)
Nutrition Brief Note   Received call from diet office. Pt with tomato flavoring allergy noted in chart. Spoke with pt via phone. 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 Pt reports that she can't have ketchup or tomato sauces as she reports that it makes her mouth break out in sores and swell. Allergy updated in chart per pt request.   Koleen Distance MS, RD, LDN Please refer to [facility] for RD and/or RD on-call/weekend/after hours pager

## 2022-03-23 NOTE — Anesthesia Postprocedure Evaluation (Signed)
Anesthesia Post Note  Patient: Melanie Juarez  Procedure(s) Performed: AMPUTATION BELOW KNEE-Guillotine (Right: Knee)  Patient location during evaluation: PACU Anesthesia Type: General Level of consciousness: awake and alert Pain management: pain level controlled Vital Signs Assessment: post-procedure vital signs reviewed and stable Respiratory status: spontaneous breathing, nonlabored ventilation, respiratory function stable and patient connected to nasal cannula oxygen Cardiovascular status: blood pressure returned to baseline and stable Postop Assessment: no apparent nausea or vomiting Anesthetic complications: no   No notable events documented.   Last Vitals:  Vitals:   03/22/22 2200 03/23/22 0545  BP: (!) 123/57 131/63  Pulse: 75 80  Resp: 15 16  Temp: 36.5 C 36.7 C  SpO2: 100% 100%    Last Pain:  Vitals:   03/23/22 0545  TempSrc: Oral  PainSc:                  [name]

## 2022-03-23 NOTE — Progress Notes (Signed)
  Progress Note    03/23/2022 12:20 PM 1 Day Post-Op  Subjective:  Melanie Juarez is a 77 yo female who is now POD#1 right guillotine (open) below the knee amputation with antibiotic bead placement.   Upon exam this morning I find the patient resting comfortably in bed eating breakfast.  Wound VAC to the open guillotine amputation is clean dry and intact and working properly.  Patient states her pain is much better this morning.  No complaints overnight.  Vitals all remained stable.   Vitals:   03/23/22 0545 03/23/22 0800  BP: 131/63 (!) 128/55  Pulse: 81 82  Resp: 16 18  Temp: 98 F (36.7 C) 98.7 F (37.1 C)  SpO2: 100% 100%   Physical Exam: Cardiac:  regular, without  Murmurs, rubs or gallops; without carotid bruits  Lungs:  normal non-labored breathing, without Rales, rhonchi, wheezing  Incisions:  Right lower extremity with wound vac in place working properly.  Extremities:  Right lower extremity amputation, open with wound vac in place.  Abdomen:  Positive bowel sounds,  soft, NT/ND, no masses  Neurologic: A&O X 3; No focal weakness or paresthesias are detected; speech is fluent/normal Normal affect.   CBC    Component Value Date/Time   WBC 14.7 (H) 03/23/2022 0318   RBC 3.05 (L) 03/23/2022 0318   HGB 8.1 (L) 03/23/2022 0318   HCT 25.2 (L) 03/23/2022 0318   PLT 309 03/23/2022 0318   MCV 82.6 03/23/2022 0318   MCH 26.6 03/23/2022 0318   MCHC 32.1 03/23/2022 0318   RDW 15.5 03/23/2022 0318   LYMPHSABS 1.3 03/19/2022 1246   MONOABS 1.4 (H) 03/19/2022 1246   EOSABS 0.1 03/19/2022 1246   BASOSABS 0.1 03/19/2022 1246    BMET    Component Value Date/Time   NA 132 (L) 03/22/2022 0459   K 3.6 03/22/2022 0459   CL 104 03/22/2022 0459   CO2 23 03/22/2022 0459   GLUCOSE 170 (H) 03/22/2022 0459   BUN 16 03/22/2022 0459   CREATININE 0.76 03/22/2022 0459   CALCIUM 7.4 (L) 03/22/2022 0459   GFRNONAA >60 03/22/2022 0459    INR No results found for:  "INR"   Intake/Output Summary (Last 24 hours) at 03/23/2022 1220 Last data filed at 03/23/2022 0910 Gross per 24 hour  Intake 2286.19 ml  Output 1175 ml  Net 1111.19 ml     Assessment/Plan:  77 y.o. female is s/p right guillotine (open) below the knee amputation.  1 Day Post-Op   PLAN:   To return to the operating room on Monday 03/26/22 for wound vac removal and reassessment. If no noted infection and tissues all appear healthy and stable then possible completion and closure of the open amputation will be done. If the evaluation shows otherwise we may proceed with just a washout with another wound vac placement and possible antibiotic bead placement or proceed to an above the knee amputation. Patient was made aware of the plan this morning. Antibiotics will continue while amputation remains open.   Plan is discussed with Dr. [name] MD and he agrees with the plan    Drema [facility] 03/23/2022 12:20 PM

## 2022-03-23 NOTE — Progress Notes (Signed)
PROGRESS NOTE  Melanie Juarez [id number] DOB: 1945/12/24   PCP: System, Provider Not In  Patient is from: Home.  Lives alone.  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  Blood cultures obtained.  Patient was started on broad-spectrum antibiotics.  Podiatry consulted and recommended vascular surgery evaluation.  Patient underwent aortogram and revascularization with right popliteal and right posterior tibial balloon angioplasty on 03/21/2022.  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  Blood cultures NGTD.  Remains on broad-spectrum antibiotics.  Plan for return to the OR for skin closure on Monday.    Subjective: Seen and examined earlier this morning.  No major events overnight of this morning.  Pain well-controlled.  About 100 cc serosanguineous fluid in wound VAC canister.  Patient's son at bedside.  Objective: Vitals:   03/22/22 2200 03/23/22 0545 03/23/22 0545 03/23/22 0800  BP: (!) 123/57 131/63 131/63 (!) 128/55  Pulse: 75 80 81 82  Resp: 15 16 16 18  $ Temp: 97.7 F (36.5 C) 98 F (36.7 C) 98 F (36.7 C) 98.7 F (37.1 C)  TempSrc: Oral Oral    SpO2: 100% 100% 100% 100%  Weight:      Height:        Examination:  GENERAL: No apparent distress.  Nontoxic. HEENT: MMM.  Vision and hearing grossly intact.  NECK: Supple.  No apparent JVD.  RESP:  No IWOB.  Fair  aeration bilaterally. CVS:  RRR. Heart sounds normal.  ABD/GI/GU: BS+. Abd soft, NTND.  MSK/EXT:   No apparent deformity.  Neuro right BKA with wound VAC. SKIN: no apparent skin lesion or wound NEURO: Awake and alert. Oriented appropriately.  No apparent focal neuro deficit. PSYCH: Calm. Normal affect.   Procedures:  2/7-aortogram and revascularization with right popliteal and right posterior tibial balloon angioplasty 2/8-right BKA with wound VAC placement  Microbiology summarized: 2/6-blood cultures NGTD  Assessment and plan: Principal Problem:   Osteomyelitis ([place]) Active Problems:   Diabetes mellitus type 2, noninsulin dependent ([place])   Essential hypertension   Leukocytosis   AKI (acute kidney injury) ([place])   Lactic acidosis   Obesity (BMI 30-39.9)   Hyperlipidemia   PAD (peripheral artery disease) ([facility])  Right foot and right ankle osteomyelitis: POA. Tenosynovitis/possible abscess or large seroma -Admitted from podiatry office.  History of uncontrolled DM-2 and untreated HLD.   -MRI as above. CRP 15.  ESR 70. -S/p revascularization and right BKA as above. -Continue vancomycin, cefepime and Flagyl for now.  Might not need antibiotic if source control achieved surgically. -Podiatry has signed off. -Optimize risk factors. -Sepsis ruled out.  Uncontrolled NIDDM-2 with hyperglycemia and hyperlipidemia: A1c 11.9%.  LDL 107.  Hyperglycemia improved. Recent Labs  Lab 03/22/22 1155 03/22/22 1617 03/22/22 2149 03/23/22 0818 03/23/22 1124  GLUCAP 153* 126* 178* 182* 245*  -Increase Semglee from 10 to 20 units daily.  Will give additional 10 units  tonight -Continue SSI-moderate. -Increase NovoLog from 3 to 5 units 3 times daily with meals -Continue Lipitor 40 mg daily -Further adjustment as appropriate.  Right lower extremity PAD: s/p revascularization as above.  Denies smoking. -Lipitor 40 mg daily -Will start low-dose aspirin after surgery once okay from surgical  standpoint   AKI: Resolved. Recent Labs    03/19/22 1246 03/20/22 1013 03/21/22 0539 03/22/22 0459  BUN 28* 21 15 16  $ CREATININE 1.36* 0.91 0.84 0.76   Lactic acidosis: Resolved.  Leukocytosis/bandemia: Improving.   Essential hypertension: Normotensive. -Continue home atenolol. -Hold home chlorthalidone.  Obesity Body mass index is 30.86 kg/m.          DVT prophylaxis:  enoxaparin (LOVENOX) injection 30 mg Start: 03/23/22 2200 Place TED hose Start: 03/19/22 1447  Code Status: Full code Family Communication: None at bedside today. Level of care: Med-Surg Status is: Inpatient Remains inpatient appropriate because: Right foot osteomyelitis   Final disposition: TBD Consultants:  Podiatry-signed off Vascular surgery  35 minutes with more than 50% spent in reviewing records, counseling patient/family and coordinating care.   Sch Meds:  Scheduled Meds:  atenolol  50 mg Oral Daily   atorvastatin  40 mg Oral Daily   enoxaparin (LOVENOX) injection  30 mg Subcutaneous Q24H   insulin aspart  0-15 Units Subcutaneous TID WC   insulin aspart  0-5 Units Subcutaneous QHS   insulin aspart  3 Units Subcutaneous TID WC   insulin glargine-yfgn  10 Units Subcutaneous Daily   Ensure Max Protein  11 oz Oral BID   Continuous Infusions:  ceFEPime (MAXIPIME) IV 2 g (03/23/22 1501)   metronidazole 500 mg (03/23/22 0146)   promethazine (PHENERGAN) injection (IM or IVPB)     vancomycin 1,250 mg (03/22/22 1723)   PRN Meds:.acetaminophen **OR** acetaminophen, diphenhydrAMINE, famotidine, fentaNYL (SUBLIMAZE) injection, fentaNYL (SUBLIMAZE) injection, HYDROmorphone (DILAUDID) injection, HYDROmorphone (DILAUDID) injection, methylPREDNISolone (SOLU-MEDROL) injection, midazolam, morphine injection, morphine injection, ondansetron **OR** ondansetron (ZOFRAN) IV, ondansetron (ZOFRAN) IV, ondansetron (ZOFRAN) IV, oxyCODONE-acetaminophen, promethazine (PHENERGAN) injection (IM or  IVPB)  Antimicrobials: Anti-infectives (From admission, onward)    Start     Dose/Rate Route Frequency Ordered Stop   03/22/22 1555  vancomycin (VANCOCIN) powder  Status:  Discontinued          As needed 03/22/22 1606 03/22/22 1619   03/21/22 1800  vancomycin (VANCOREADY) IVPB 1250 mg/250 mL        1,250 mg 166.7 mL/hr over 90 Minutes Intravenous Every 24 hours 03/21/22 1350     03/21/22 1500  ceFEPIme (MAXIPIME) 2 g in sodium chloride 0.9 % 100 mL IVPB        2 g 200 mL/hr over 30 Minutes Intravenous Every 8 hours 03/21/22 1350     03/21/22 0727  ceFAZolin (ANCEF) 2-4 GM/100ML-% IVPB       Note to Pharmacy: Maynor, Erin A: cabinet override      03/21/22 0727 03/21/22 1944   03/21/22 0257  ceFAZolin (ANCEF) IVPB 2g/100 mL premix        2 g 200 mL/hr over 30 Minutes Intravenous 30 min pre-op 03/21/22 0257 03/22/22 0731   03/20/22 1800  vancomycin (VANCOCIN) IVPB 1000 mg/200 mL premix  Status:  Discontinued        1,000 mg 200 mL/hr over 60 Minutes Intravenous Every 24 hours 03/19/22 1644 03/21/22 1350   03/19/22 1600  ceFEPIme (MAXIPIME) 2 g in sodium chloride 0.9 % 100 mL IVPB  Status:  Discontinued        2  g 200 mL/hr over 30 Minutes Intravenous Every 12 hours 03/19/22 1500 03/21/22 1350   03/19/22 1445  vancomycin (VANCOREADY) IVPB 2000 mg/400 mL        2,000 mg 200 mL/hr over 120 Minutes Intravenous  Once 03/19/22 1439 03/19/22 2155   03/19/22 1430  cefTRIAXone (ROCEPHIN) 2 g in sodium chloride 0.9 % 100 mL IVPB  Status:  Discontinued        2 g 200 mL/hr over 30 Minutes Intravenous Every 24 hours 03/19/22 1426 03/19/22 1450   03/19/22 1430  metroNIDAZOLE (FLAGYL) IVPB 500 mg        500 mg 100 mL/hr over 60 Minutes Intravenous Every 12 hours 03/19/22 1426 03/26/22 1429        I have personally reviewed the following labs and images: CBC: Recent Labs  Lab 03/19/22 1246 03/20/22 1013 03/21/22 0539 03/22/22 0459 03/23/22 0318  WBC 19.2* 14.3* 13.8* 14.1* 14.7*   NEUTROABS 16.1*  --   --   --   --   HGB 10.6* 8.6* 8.1* 8.1* 8.1*  HCT 33.3* 26.4* 24.9* 24.8* 25.2*  MCV 83.0 82.5 82.2 82.1 82.6  PLT 352 283 262 279 309   BMP &GFR Recent Labs  Lab 03/19/22 1246 03/20/22 1013 03/21/22 0539 03/22/22 0459  NA 124* 132* 131* 132*  K 5.0 3.8 3.5 3.6  CL 88* 104 103 104  CO2 24 22 22 23  $ GLUCOSE 422* 140* 218* 170*  BUN 28* 21 15 16  $ CREATININE 1.36* 0.91 0.84 0.76  CALCIUM 8.5* 7.6* 7.4* 7.4*  MG  --   --   --  1.2*  PHOS  --   --   --  2.5   Estimated Creatinine Clearance: 72.1 mL/min (by C-G formula based on SCr of 0.76 mg/dL). Liver & Pancreas: Recent Labs  Lab 03/22/22 0459  ALBUMIN 1.6*   No results for input(s): "LIPASE", "AMYLASE" in the last 168 hours. No results for input(s): "AMMONIA" in the last 168 hours. Diabetic: No results for input(s): "HGBA1C" in the last 72 hours.  Recent Labs  Lab 03/22/22 1155 03/22/22 1617 03/22/22 2149 03/23/22 0818 03/23/22 1124  GLUCAP 153* 126* 178* 182* 245*   Cardiac Enzymes: No results for input(s): "CKTOTAL", "CKMB", "CKMBINDEX", "TROPONINI" in the last 168 hours. No results for input(s): "PROBNP" in the last 8760 hours. Coagulation Profile: No results for input(s): "INR", "PROTIME" in the last 168 hours. Thyroid Function Tests: No results for input(s): "TSH", "T4TOTAL", "FREET4", "T3FREE", "THYROIDAB" in the last 72 hours. Lipid Profile: Recent Labs    03/22/22 0459  CHOL 147  HDL 24*  LDLCALC 107*  TRIG 82  CHOLHDL 6.1   Anemia Panel: No results for input(s): "VITAMINB12", "FOLATE", "FERRITIN", "TIBC", "IRON", "RETICCTPCT" in the last 72 hours. Urine analysis: No results found for: "COLORURINE", "APPEARANCEUR", "LABSPEC", "PHURINE", "GLUCOSEU", "HGBUR", "BILIRUBINUR", "KETONESUR", "PROTEINUR", "UROBILINOGEN", "NITRITE", "LEUKOCYTESUR" Sepsis Labs: Invalid input(s): "PROCALCITONIN", "LACTICIDVEN"  Microbiology: Recent Results (from the past 240 hour(s))  Culture,  blood (Routine X 2) w Reflex to ID Panel     Status: None (Preliminary result)   Collection Time: 03/19/22  6:11 PM   Specimen: BLOOD  Result Value Ref Range Status   Specimen Description BLOOD BLOOD RIGHT HAND  Final   Special Requests   Final    BOTTLES DRAWN AEROBIC AND ANAEROBIC Blood Culture adequate volume   Culture   Final    NO GROWTH 4 DAYS Performed at [facility], [address], [place], [place] [address]  Report Status PENDING  Incomplete  Culture, blood (Routine X 2) w Reflex to ID Panel     Status: None (Preliminary result)   Collection Time: 03/19/22  6:18 PM   Specimen: BLOOD  Result Value Ref Range Status   Specimen Description BLOOD BLOOD LEFT HAND  Final   Special Requests   Final    BOTTLES DRAWN AEROBIC ONLY Blood Culture results may not be optimal due to an inadequate volume of blood received in culture bottles   Culture   Final    NO GROWTH 4 DAYS Performed at [facility], [address], [place], [place] [address]    Report Status PENDING  Incomplete    Radiology Studies: No results found.    Kambri Dismore T. [place]  If 7PM-7AM, please contact night-coverage [email or web address] 03/23/2022, 3:14 PM

## 2022-03-23 NOTE — Care Management Important Message (Signed)
Important Message  Patient Details  Name: Melanie Juarez MRN: [id number] Date of Birth: 27-Jul-1945   Medicare Important Message Given:  Yes     Dannette Barbara 03/23/2022, 1:02 PM

## 2022-03-23 NOTE — Consult Note (Signed)
Pharmacy Antibiotic Note  Melanie Juarez is a 77 y.o. female admitted on 03/19/2022 with  evaluation for potential right foot infection . Patient has a past medical history of diabetes and history of amputation of right great toe due to gangrene 2 years ago and was told to come to the ED after initially going to [facility] as there is concern for osteomyelitis and gangrene.  Pharmacy has been consulted for Vancomycin and Cefepime dosing.  Plan: Continue Vancomycin to 1250 mg IV every 24 hours Vancomycin peak ordered for 2/9 @ 2130 Vancomycin trough ordered for 2/10 @ 1700 Continue Cefepime to 2 grams IV every 8 hours Follow renal function and levels for further adjustments Flagyl 500 mg IV every 12 hours per provider    Height: 5' 7.5" (171.5 cm) Weight: 90.7 kg (199 lb 15.3 oz) IBW/kg (Calculated) : 62.75  Temp (24hrs), Avg:98.1 F (36.7 C), Min:97.7 F (36.5 C), Max:98.7 F (37.1 C)  Recent Labs  Lab 03/19/22 1246 03/19/22 1811 03/19/22 2201 03/20/22 1013 03/21/22 0539 03/22/22 0459 03/23/22 0318  WBC 19.2*  --   --  14.3* 13.8* 14.1* 14.7*  CREATININE 1.36*  --   --  0.91 0.84 0.76  --   LATICACIDVEN 1.4 2.6* 1.3 0.8  --   --   --      Estimated Creatinine Clearance: 72.1 mL/min (by C-G formula based on SCr of 0.76 mg/dL).    Allergies  Allergen Reactions   Tomato     Mouth breaks out    Antimicrobials this admission: Vancomycin 2/5 >>  Cefepime 2/5 >>  Flagyl 2/5 >>  Dose adjustments this admission: Cefepime 2g q12h to q8h Vancomycin 1g q24h to 1260m q24h  Microbiology results: 2/5 BCx: ngtd  Thank you for allowing pharmacy to be a part of this patient's care.  HLorin Picket PharmD 03/23/2022 2:06 PM

## 2022-03-24 DIAGNOSIS — M869 Osteomyelitis, unspecified: Secondary | ICD-10-CM | POA: Diagnosis not present

## 2022-03-24 LAB — RENAL FUNCTION PANEL
Albumin: 1.7 g/dL — ABNORMAL LOW (ref 3.5–5.0)
Anion gap: 5 (ref 5–15)
BUN: 15 mg/dL (ref 8–23)
CO2: 23 mmol/L (ref 22–32)
Calcium: 7.5 mg/dL — ABNORMAL LOW (ref 8.9–10.3)
Chloride: 106 mmol/L (ref 98–111)
Creatinine, Ser: 0.77 mg/dL (ref 0.44–1.00)
GFR, Estimated: >60 mL/min (ref >60)
Glucose, Bld: 139 mg/dL — ABNORMAL HIGH (ref 70–99)
Phosphorus: 1.8 mg/dL — ABNORMAL LOW (ref 2.5–4.6)
Potassium: 3.5 mmol/L (ref 3.5–5.1)
Sodium: 134 mmol/L — ABNORMAL LOW (ref 135–145)

## 2022-03-24 LAB — CULTURE, BLOOD (ROUTINE X 2)
Culture: NO GROWTH
Culture: NO GROWTH
Special Requests: ADEQUATE

## 2022-03-24 LAB — CBC WITH DIFFERENTIAL/PLATELET
Abs Immature Granulocytes: 0.33 10*3/uL — ABNORMAL HIGH (ref 0.00–0.07)
Basophils Absolute: 0.1 10*3/uL (ref 0.0–0.1)
Basophils Relative: 1 %
Eosinophils Absolute: 0.4 10*3/uL (ref 0.0–0.5)
Eosinophils Relative: 3 %
HCT: 23.6 % — ABNORMAL LOW (ref 36.0–46.0)
Hemoglobin: 7.5 g/dL — ABNORMAL LOW (ref 12.0–15.0)
Immature Granulocytes: 2 %
Lymphocytes Relative: 21 %
Lymphs Abs: 2.9 10*3/uL (ref 0.7–4.0)
MCH: 26.5 pg (ref 26.0–34.0)
MCHC: 31.8 g/dL (ref 30.0–36.0)
MCV: 83.4 fL (ref 80.0–100.0)
Monocytes Absolute: 1.3 10*3/uL — ABNORMAL HIGH (ref 0.1–1.0)
Monocytes Relative: 10 %
Neutro Abs: 8.7 10*3/uL — ABNORMAL HIGH (ref 1.7–7.7)
Neutrophils Relative %: 63 %
Platelets: 307 10*3/uL (ref 150–400)
RBC: 2.83 MIL/uL — ABNORMAL LOW (ref 3.87–5.11)
RDW: 15.9 % — ABNORMAL HIGH (ref 11.5–15.5)
WBC: 13.6 10*3/uL — ABNORMAL HIGH (ref 4.0–10.5)
nRBC: 0.2 % (ref 0.0–0.2)

## 2022-03-24 LAB — GLUCOSE, CAPILLARY
Glucose-Capillary: 131 mg/dL — ABNORMAL HIGH (ref 70–99)
Glucose-Capillary: 101 mg/dL — ABNORMAL HIGH (ref 70–99)
Glucose-Capillary: 65 mg/dL — ABNORMAL LOW (ref 70–99)
Glucose-Capillary: 67 mg/dL — ABNORMAL LOW (ref 70–99)
Glucose-Capillary: 74 mg/dL (ref 70–99)
Glucose-Capillary: 188 mg/dL — ABNORMAL HIGH (ref 70–99)

## 2022-03-24 LAB — VANCOMYCIN, TROUGH: Vancomycin Tr: 31 ug/mL (ref 15–20)

## 2022-03-24 LAB — VANCOMYCIN, PEAK: Vancomycin Pk: 34 ug/mL (ref 30–40)

## 2022-03-24 LAB — MAGNESIUM: Magnesium: 1.8 mg/dL (ref 1.7–2.4)

## 2022-03-24 MED ORDER — K PHOS MONO-SOD PHOS DI & MONO 155-852-130 MG PO TABS
500.0000 mg | ORAL_TABLET | Freq: Two times a day (BID) | ORAL | Status: AC
  Administered 2022-03-24 – 2022-03-25 (×3): 500 mg via ORAL
  Filled 2022-03-24 (×3): qty 2

## 2022-03-24 MED ORDER — ENOXAPARIN SODIUM 40 MG/0.4ML IJ SOSY
40.0000 mg | PREFILLED_SYRINGE | INTRAMUSCULAR | Status: DC
  Administered 2022-03-24 – 2022-03-26 (×3): 40 mg via SUBCUTANEOUS
  Filled 2022-03-24 (×3): qty 0.4

## 2022-03-24 NOTE — Progress Notes (Signed)
PROGRESS NOTE  Melanie Juarez  DOB: Apr 09, 1945  PCP: System, Provider Not In [id number]  DOA: 03/19/2022  LOS: 5 days  Hospital Day: 6  Brief narrative: Melanie Juarez is a 77 y.o. 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 2/5, patient was directed to ED from podiatry office with concern of right ankle/foot osteomyelitis MRI right foot on admission confirmed with suspicion. Blood culture obtained Started on broad-spectrum IV antibiotics Admitted to [place] and vascular surgery consulted. 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    Blood cultures NGTD.   Remains on broadspectrum antibiotics.   Plan for return to the OR for skin closure on Monday.  Subjective: Patient was seen and examined this morning.  Elderly African-American female.  Lying on bed.  Not in distress.  Wound VAC in place. Chart reviewed In the last 24 hours, afebrile, heart rate in 70s and 80s, blood pressure 141/57 Last set of labs from this morning showed WC count of 13.6, hemoglobin low at 7.5, sodium low at 134, phosphorus low at 1.8  Assessment and plan: Right foot and right ankle osteomyelitis - POA S/p revascularization and right BKA as above. Currently on IV cefepime, IV Flagyl and IV vancomycin.  Blood culture no growth till date. Noted to plan for return to the OR for skin closure on Monday 2/12.  Wound VAC in place. WBC count improving.  Lactic acid level improved. Pain control with oral and IV opioids as needed Recent Labs  Lab 03/19/22 1246 03/19/22 1811 03/19/22 2201 03/20/22 1013 03/21/22 0539 03/22/22 0459 03/23/22 0318 03/24/22 0532  WBC 19.2*  --   --  14.3* 13.8* 14.1* 14.7* 13.6*  LATICACIDVEN 1.4 2.6* 1.3 0.8  --   --   --   --    Type 2 diabetes mellitus uncontrolled  with hyperglycemia A1c elevated to 11.9 on 03/20/2022 PTA on metformin 1000 mg twice daily, glipizide 5 mg twice daily.  Currently on hold. Currently on Semglee 20 units daily, Premeal scheduled NovoLog 5 units 3 times daily, sliding scale insulin Recent Labs  Lab 03/23/22 1124 03/23/22 1649 03/23/22 2137 03/24/22 0737 03/24/22 1118  GLUCAP 245* 193* 127* 131* 101*   PAD s/p revascularization as above Hyperlipidemia Continue Lipitor 40 mg daily  Hypomagnesemia/hypophosphatemia Phosphorus level low at 1.8 this morning.  Replacement ordered Recent Labs  Lab 03/19/22 1246 03/20/22 1013 03/21/22 0539 03/22/22 0459 03/24/22 0532  K 5.0 3.8 3.5 3.6 3.5  MG  --   --   --  1.2* 1.8  PHOS  --   --   --  2.5 1.8*   AKI Creatinine elevated to 1.36 on admission.  Improved with hydration. Recent Labs    03/19/22 1246 03/20/22 1013 03/21/22 0539 03/22/22 0459 03/24/22 0532  BUN 28* 21 15 16 15  $ CREATININE 1.36* 0.91 0.84 0.76 0.77   Essential hypertension PTA on atenolol 50 mg daily, chlorthalidone 25 mg daily Continue atenolol.  Chlorthalidone on hold. IV hydralazine as needed  Acute on chronic anemia Hemoglobin more than 10 at baseline.  Downtrending.  7.5 this morning.  No active bleeding except for surgical blood loss.  Continue to monitor.  Transfuse if less than 7. Recent Labs    03/20/22 1013 03/21/22 0539 03/22/22 0459 03/23/22 0318 03/24/22 0532  HGB 8.6* 8.1* 8.1* 8.1* 7.5*  MCV 82.5 82.2 82.1 82.6 83.4    Obesity  Body mass index is 32.76 kg/m. Patient has been advised to make an attempt to improve diet and exercise patterns to aid in weight loss.   Mobility: Will get PT eval  Goals of care   Code Status: Full Code     DVT prophylaxis:  enoxaparin (LOVENOX) injection 40 mg Start: 03/24/22 2200 Place TED hose Start: 03/19/22 1447   Antimicrobials: IV antibiotics Fluid: None currently Consultants: Vascular surgery Family Communication: None at  bedside  Status is: Inpatient Level of care: Med-Surg   Dispo: Patient is from: Home              Anticipated d/c is to: Pending clinical course Continue inhospital care because: Pending second stage surgery, pending PT eval   Scheduled Meds:  atenolol  50 mg Oral Daily   atorvastatin  40 mg Oral Daily   enoxaparin (LOVENOX) injection  40 mg Subcutaneous Q24H   insulin aspart  0-15 Units Subcutaneous TID WC   insulin aspart  0-5 Units Subcutaneous QHS   insulin aspart  5 Units Subcutaneous TID WC   insulin glargine-yfgn  20 Units Subcutaneous Daily   phosphorus  500 mg Oral BID   Ensure Max Protein  11 oz Oral BID    PRN meds: acetaminophen **OR** acetaminophen, diphenhydrAMINE, famotidine, midazolam, morphine injection, ondansetron **OR** ondansetron (ZOFRAN) IV, ondansetron (ZOFRAN) IV, ondansetron (ZOFRAN) IV, oxyCODONE-acetaminophen, promethazine (PHENERGAN) injection (IM or IVPB)   Infusions:   ceFEPime (MAXIPIME) IV 2 g (03/24/22 [id number])   metronidazole 500 mg (03/24/22 1341)   promethazine (PHENERGAN) injection (IM or IVPB)     vancomycin Stopped (03/23/22 1933)    Diet:  Diet Order             Diet Carb Modified Fluid consistency: Thin; Room service appropriate? Yes  Diet effective now                   Antimicrobials: Anti-infectives (From admission, onward)    Start     Dose/Rate Route Frequency Ordered Stop   03/22/22 1555  vancomycin (VANCOCIN) powder  Status:  Discontinued          As needed 03/22/22 1606 03/22/22 1619   03/21/22 1800  vancomycin (VANCOREADY) IVPB 1250 mg/250 mL        1,250 mg 166.7 mL/hr over 90 Minutes Intravenous Every 24 hours 03/21/22 1350     03/21/22 1500  ceFEPIme (MAXIPIME) 2 g in sodium chloride 0.9 % 100 mL IVPB        2 g 200 mL/hr over 30 Minutes Intravenous Every 8 hours 03/21/22 1350     03/21/22 0727  ceFAZolin (ANCEF) 2-4 GM/100ML-% IVPB       Note to Pharmacy: Maynor, Junie Panning A: cabinet override      03/21/22 0727  03/21/22 1944   03/21/22 0257  ceFAZolin (ANCEF) IVPB 2g/100 mL premix        2 g 200 mL/hr over 30 Minutes Intravenous 30 min pre-op 03/21/22 0257 03/22/22 0731   03/20/22 1800  vancomycin (VANCOCIN) IVPB 1000 mg/200 mL premix  Status:  Discontinued        1,000 mg 200 mL/hr over 60 Minutes Intravenous Every 24 hours 03/19/22 1644 03/21/22 1350   03/19/22 1600  ceFEPIme (MAXIPIME) 2 g in sodium chloride 0.9 % 100 mL IVPB  Status:  Discontinued        2 g 200 mL/hr over 30 Minutes Intravenous Every 12 hours  03/19/22 1500 03/21/22 1350   03/19/22 1445  vancomycin (VANCOREADY) IVPB 2000 mg/400 mL        2,000 mg 200 mL/hr over 120 Minutes Intravenous  Once 03/19/22 1439 03/19/22 2155   03/19/22 1430  cefTRIAXone (ROCEPHIN) 2 g in sodium chloride 0.9 % 100 mL IVPB  Status:  Discontinued        2 g 200 mL/hr over 30 Minutes Intravenous Every 24 hours 03/19/22 1426 03/19/22 1450   03/19/22 1430  metroNIDAZOLE (FLAGYL) IVPB 500 mg        500 mg 100 mL/hr over 60 Minutes Intravenous Every 12 hours 03/19/22 1426 03/26/22 1429       Skin assessment:       Nutritional status:  Body mass index is 32.76 kg/m.          Objective: Vitals:   03/24/22 0415 03/24/22 0735  BP: (!) 122/54 (!) 141/57  Pulse: 79 81  Resp: 20 18  Temp: 98.2 F (36.8 C) 98.6 F (37 C)  SpO2: 99% 100%    Intake/Output Summary (Last 24 hours) at 03/24/2022 1407 Last data filed at 03/24/2022 1045 Gross per 24 hour  Intake 489.82 ml  Output 1500 ml  Net -1010.18 ml   Filed Weights   03/19/22 1446 03/22/22 1209 03/24/22 0415  Weight: 90.7 kg 90.7 kg 96.3 kg   Weight change: 5.6 kg Body mass index is 32.76 kg/m.   Physical Exam: General exam: Pleasant, elderly African-American female.  Not in distress Skin: No rashes, lesions or ulcers. HEENT: Atraumatic, normocephalic, no obvious bleeding Lungs: Clear to auscultation bilaterally CVS: Regular rate and rhythm, no murmur GI/Abd soft nontender,  nondistended, bowel sound present CNS: Alert, awake, oriented x 3 Psychiatry: Mood appropriate Extremities: Right BKA status.  Data Review: I have personally reviewed the laboratory data and studies available.  F/u labs ordered Unresulted Labs (From admission, onward)     Start     Ordered   03/24/22 1700  Vancomycin, trough  Once-Timed,   TIMED       Question:  Specimen collection method  Answer:  Lab=Lab collect   03/23/22 1413            Total time spent in review of labs and imaging, patient evaluation, formulation of plan, documentation and communication with family: 21 minutes  Signed, Terrilee Croak, MD [facility] 03/24/2022

## 2022-03-24 NOTE — Plan of Care (Signed)
  Problem: Education: Goal: Ability to describe self-care measures that may prevent or decrease complications (Diabetes Survival Skills Education) will improve Outcome: Progressing   Problem: Coping: Goal: Ability to adjust to condition or change in health will improve Outcome: Progressing   Problem: Health Behavior/Discharge Planning: Goal: Ability to manage health-related needs will improve Outcome: Progressing   Problem: Metabolic: Goal: Ability to maintain appropriate glucose levels will improve Outcome: Progressing   Problem: Nutritional: Goal: Maintenance of adequate nutrition will improve Outcome: Progressing   Problem: Skin Integrity: Goal: Risk for impaired skin integrity will decrease Outcome: Progressing   Problem: Tissue Perfusion: Goal: Adequacy of tissue perfusion will improve Outcome: Progressing   Problem: Education: Goal: Knowledge of General Education information will improve Description: Including pain rating scale, medication(s)/side effects and non-pharmacologic comfort measures Outcome: Progressing   Problem: Clinical Measurements: Goal: Ability to maintain clinical measurements within normal limits will improve Outcome: Progressing Goal: Will remain free from infection Outcome: Progressing Goal: Diagnostic test results will improve Outcome: Progressing   Problem: Activity: Goal: Risk for activity intolerance will decrease Outcome: Progressing   Problem: Nutrition: Goal: Adequate nutrition will be maintained Outcome: Progressing   Problem: Coping: Goal: Level of anxiety will decrease Outcome: Progressing   Problem: Elimination: Goal: Will not experience complications related to bowel motility Outcome: Progressing Goal: Will not experience complications related to urinary retention Outcome: Progressing   Problem: Pain Managment: Goal: General experience of comfort will improve Outcome: Progressing   Problem: Safety: Goal: Ability to  remain free from injury will improve Outcome: Progressing   Problem: Skin Integrity: Goal: Risk for impaired skin integrity will decrease Outcome: Progressing

## 2022-03-24 NOTE — Progress Notes (Signed)
PHARMACIST - PHYSICIAN COMMUNICATION  CONCERNING:  Enoxaparin (Lovenox) for DVT Prophylaxis   RECOMMENDATION: Patient was prescribed enoxaparin 33m q24 hours for VTE prophylaxis.   Filed Weights   03/19/22 1446 03/22/22 1209 03/24/22 0415  Weight: 90.7 kg (199 lb 15.3 oz) 90.7 kg (199 lb 15.3 oz) 96.3 kg (212 lb 4.9 oz)    Body mass index is 32.76 kg/m.  Estimated Creatinine Clearance: 74.2 mL/min (by C-G formula based on SCr of 0.77 mg/dL).  Patient is candidate for enoxaparin 487mevery 24 hours based on CrCl >3072min and Weight >45kg  DESCRIPTION: [facility]  Patient is now receiving enoxaparin 40 mg every 24 hours    AleBenita Gutter10/2024 8:29 AM

## 2022-03-24 NOTE — Evaluation (Signed)
Physical Therapy Evaluation Patient Details Name: Melanie Juarez MRN: [id number] DOB: [birth year] Today's Date: 03/24/2022  History of Present Illness  Pt is a 77 y.o. 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  MD assessement also includes AKI, hypomagnesemia/hypophosphatemia, and acute on chronic anemia.   Clinical Impression  Pt was pleasant and motivated to participate during the session and put forth good effort throughout. Pt required significant time, effort, and use of bed rails with sup to/from sit but no physical assist.  Pt made multiple attempts with assistance to come to standing at the EOB with a RW but although she was able to minimally clear the surface of the bed she was unable to come to full standing position.  Pt was able to laterally scoot at the EOB several small scoots with cues for sequencing.  Pt will benefit from PT services in a SNF setting upon discharge to safely address deficits listed in patient problem list for decreased caregiver assistance and eventual return to PLOF.         Recommendations for follow up therapy are one component of a multi-disciplinary discharge planning process, led by the attending physician.  Recommendations may be updated based on patient status, additional functional criteria and insurance authorization.  Follow Up Recommendations Skilled nursing-short term rehab (<3 hours/day) Can patient physically be transported by private vehicle: No    Assistance Recommended at Discharge Frequent or constant Supervision/Assistance  Patient can return home with the following  Two people to help with walking and/or transfers;A lot of help with bathing/dressing/bathroom;Assistance  with cooking/housework;Direct supervision/assist for medications management;Assist for transportation;Help with stairs or ramp for entrance    Equipment Recommendations Other (comment) (TBD at next venue of care)  Recommendations for Other Services       Functional Status Assessment Patient has had a recent decline in their functional status and demonstrates the ability to make significant improvements in function in a reasonable and predictable amount of time.     Precautions / Restrictions Precautions Precautions: Fall Restrictions Weight Bearing Restrictions: Yes RLE Weight Bearing: Non weight bearing      Mobility  Bed Mobility Overal bed mobility: Modified Independent             General bed mobility comments: Extra time and effort as well as use of bed rails required    Transfers Overall transfer level: Needs assistance Equipment used: Rolling walker (2 wheels) Transfers: Sit to/from Stand, Bed to chair/wheelchair/BSC             General transfer comment: Multiple attempts made to come to standing with pt able to minimally clear the EOB but unable to come to full standing position even with assist; pt able to perform several small lateral scoots at the EOB with SBA for safety    Ambulation/Gait               General Gait Details: Unable/unsafe to attempt  Stairs            Wheelchair Mobility    Modified Rankin (Stroke Patients Only)       Balance Overall balance assessment: Needs assistance   Sitting balance-Leahy Scale: Good  Pertinent Vitals/Pain Pain Assessment Pain Assessment: No/denies pain    Home Living Family/patient expects to be discharged to:: Private residence Living Arrangements: Children Available Help at Discharge: Family;Available PRN/intermittently Type of Home: House Home Access: Level entry       Home Layout: One level Home Equipment: None Additional  Comments: Home setup above is daughter's home where pt plans to discharge to once appropriate    Prior Function Prior Level of Function : Independent/Modified Independent             Mobility Comments: Ind amb community distances without an AD, no fall history ADLs Comments: Ind with ADLs     Hand Dominance        Extremity/Trunk Assessment   Upper Extremity Assessment Upper Extremity Assessment: Generalized weakness    Lower Extremity Assessment Lower Extremity Assessment: Generalized weakness       Communication   Communication: No difficulties  Cognition Arousal/Alertness: Awake/alert Behavior During Therapy: WFL for tasks assessed/performed Overall Cognitive Status: Within Functional Limits for tasks assessed                                          General Comments      Exercises Total Joint Exercises Ankle Circles/Pumps: AROM, Strengthening, Left, 10 reps Quad Sets: Strengthening, Both, 5 reps, 10 reps Gluteal Sets: Strengthening, Both, 10 reps Hip ABduction/ADduction: AROM, Strengthening, Both, 5 reps Straight Leg Raises: AROM, Strengthening, Both, 5 reps Long Arc Quad: AROM, Strengthening, Both, 5 reps, 10 reps Knee Flexion: AROM, Strengthening, Both, 5 reps, 10 reps Other Exercises Other Exercises: Pt and nursing education on RLE positioning to promote R knee ext PROM Other Exercises: HEP education for LLE APs and BLE QS, GS, and seated knee flex x 10 each overy 1-2 hours as able during the day   Assessment/Plan    PT Assessment Patient needs continued PT services  PT Problem List Decreased strength;Decreased activity tolerance;Decreased balance;Decreased mobility;Decreased knowledge of use of DME;Decreased knowledge of precautions       PT Treatment Interventions DME instruction;Gait training;Functional mobility training;Therapeutic activities;Therapeutic exercise;Balance training;Patient/family education    PT Goals (Current  goals can be found in the Care Plan section)  Acute Rehab PT Goals Patient Stated Goal: To get back home and be independent PT Goal Formulation: With patient Time For Goal Achievement: 04/06/22 Potential to Achieve Goals: Good    Frequency 7X/week     Co-evaluation               AM-PAC PT "6 Clicks" Mobility  Outcome Measure Help needed turning from your back to your side while in a flat bed without using bedrails?: A Little Help needed moving from lying on your back to sitting on the side of a flat bed without using bedrails?: A Little Help needed moving to and from a bed to a chair (including a wheelchair)?: A Lot Help needed standing up from a chair using your arms (e.g., wheelchair or bedside chair)?: A Lot Help needed to walk in hospital room?: Total Help needed climbing 3-5 steps with a railing? : Total 6 Click Score: 12    End of Session Equipment Utilized During Treatment: Gait belt Activity Tolerance: Patient tolerated treatment well Patient left: in bed;with call bell/phone within reach;with bed alarm set Nurse Communication: Mobility status PT Visit Diagnosis: Difficulty in walking, not elsewhere classified (R26.2);Muscle weakness (generalized) (M62.81)    Time:  [id number] PT Time Calculation (min) (ACUTE ONLY): 33 min   Charges:   PT Evaluation $PT Eval Moderate Complexity: 1 Mod PT Treatments $Therapeutic Exercise: 8-22 mins      D. Scott Jillyn Stacey PT, DPT 03/24/22, 3:09 PM

## 2022-03-25 DIAGNOSIS — M869 Osteomyelitis, unspecified: Secondary | ICD-10-CM | POA: Diagnosis not present

## 2022-03-25 LAB — BASIC METABOLIC PANEL
Anion gap: 5 (ref 5–15)
BUN: 16 mg/dL (ref 8–23)
CO2: 24 mmol/L (ref 22–32)
Calcium: 7.8 mg/dL — ABNORMAL LOW (ref 8.9–10.3)
Chloride: 105 mmol/L (ref 98–111)
Creatinine, Ser: 0.87 mg/dL (ref 0.44–1.00)
GFR, Estimated: >60 mL/min (ref >60)
Glucose, Bld: 148 mg/dL — ABNORMAL HIGH (ref 70–99)
Potassium: 4 mmol/L (ref 3.5–5.1)
Sodium: 134 mmol/L — ABNORMAL LOW (ref 135–145)

## 2022-03-25 LAB — CBC WITH DIFFERENTIAL/PLATELET
Abs Immature Granulocytes: 0.49 10*3/uL — ABNORMAL HIGH (ref 0.00–0.07)
Basophils Absolute: 0.1 10*3/uL (ref 0.0–0.1)
Basophils Relative: 1 %
Eosinophils Absolute: 0.4 10*3/uL (ref 0.0–0.5)
Eosinophils Relative: 4 %
HCT: 25 % — ABNORMAL LOW (ref 36.0–46.0)
Hemoglobin: 8.1 g/dL — ABNORMAL LOW (ref 12.0–15.0)
Immature Granulocytes: 4 %
Lymphocytes Relative: 27 %
Lymphs Abs: 3.2 10*3/uL (ref 0.7–4.0)
MCH: 27 pg (ref 26.0–34.0)
MCHC: 32.4 g/dL (ref 30.0–36.0)
MCV: 83.3 fL (ref 80.0–100.0)
Monocytes Absolute: 1.2 10*3/uL — ABNORMAL HIGH (ref 0.1–1.0)
Monocytes Relative: 10 %
Neutro Abs: 6.5 10*3/uL (ref 1.7–7.7)
Neutrophils Relative %: 54 %
Platelets: 322 10*3/uL (ref 150–400)
RBC: 3 MIL/uL — ABNORMAL LOW (ref 3.87–5.11)
RDW: 16.6 % — ABNORMAL HIGH (ref 11.5–15.5)
WBC: 12 10*3/uL — ABNORMAL HIGH (ref 4.0–10.5)
nRBC: 0.6 % — ABNORMAL HIGH (ref 0.0–0.2)

## 2022-03-25 LAB — GLUCOSE, CAPILLARY
Glucose-Capillary: 131 mg/dL — ABNORMAL HIGH (ref 70–99)
Glucose-Capillary: 127 mg/dL — ABNORMAL HIGH (ref 70–99)
Glucose-Capillary: 123 mg/dL — ABNORMAL HIGH (ref 70–99)
Glucose-Capillary: 111 mg/dL — ABNORMAL HIGH (ref 70–99)
Glucose-Capillary: 134 mg/dL — ABNORMAL HIGH (ref 70–99)

## 2022-03-25 LAB — VANCOMYCIN, TROUGH: Vancomycin Tr: 22 ug/mL (ref 15–20)

## 2022-03-25 MED ORDER — VANCOMYCIN HCL IN DEXTROSE 1-5 GM/200ML-% IV SOLN
1000.0000 mg | INTRAVENOUS | Status: DC
  Administered 2022-03-25 – 2022-03-27 (×3): 1000 mg via INTRAVENOUS
  Filled 2022-03-25 (×3): qty 200

## 2022-03-25 MED ORDER — SODIUM CHLORIDE 0.9 % IV SOLN: INTRAVENOUS | Status: DC | PRN

## 2022-03-25 NOTE — Consult Note (Signed)
Pharmacy Antibiotic Note  Melanie Juarez is a 77 y.o. female admitted on 03/19/2022 with  evaluation for potential right foot infection . Patient has a past medical history of diabetes and history of amputation of right great toe due to gangrene 2 years ago and was told to come to the ED after initially going to [facility] as there is concern for osteomyelitis and gangrene.  Pharmacy has been consulted for Vancomycin and Cefepime dosing.  Calculated PK parameters based on levels AUC 663.9 Cmin 21 mcg/mL Ke 0.0235   Plan: Reduce Vancomycin to 1000 mg IV every 24 hours Goal AUC 400-600 Predicted parameters based on new dosing: AUC 531.1 Cmin 16.7 mcg/mL Continue Cefepime to 2 grams IV every 8 hours Follow renal function and levels for further adjustments Flagyl 500 mg IV every 12 hours per provider   Height: 5' 7.5" (171.5 cm) Weight: 96.3 kg (212 lb 4.9 oz) IBW/kg (Calculated) : 62.75  Temp (24hrs), Avg:98.2 F (36.8 C), Min:97.9 F (36.6 C), Max:98.6 F (37 C)  Recent Labs  Lab 03/19/22 1246 03/19/22 1811 03/19/22 2201 03/20/22 1013 03/21/22 0539 03/22/22 0459 03/23/22 0318 03/23/22 2219 03/24/22 0532 03/24/22 1739 03/24/22 2052 03/25/22 0535 03/25/22 1612  WBC 19.2*  --   --  14.3* 13.8* 14.1* 14.7*  --  13.6*  --   --  12.0*  --   CREATININE 1.36*  --   --  0.91 0.84 0.76  --   --  0.77  --   --  0.87  --   LATICACIDVEN 1.4 2.6* 1.3 0.8  --   --   --   --   --   --   --   --   --   VANCOTROUGH  --   --   --   --   --   --   --   --   --  31*  --   --  22*  VANCOPEAK  --   --   --   --   --   --   --  31  --   --  34  --   --      Estimated Creatinine Clearance: 68.2 mL/min (by C-G formula based on SCr of 0.87 mg/dL).    Allergies  Allergen Reactions   Tomato     Mouth breaks out    Antimicrobials this admission: Vancomycin 2/5 >>  Cefepime 2/5 >>  Flagyl 2/5 >>  Dose adjustments this admission: Cefepime 2g q12h to q8h Vancomycin 1g q24h to 1261m  q24h Vancomycin 1250 mg q24 to 1g q24  Microbiology results: 2/5 BCx: NG x 5 days  Thank you for allowing pharmacy to be a part of this patient's care.  CGlean Salvo PharmD, BCPS Clinical Pharmacist  03/25/2022 4:38 PM

## 2022-03-25 NOTE — Progress Notes (Signed)
Physical Therapy Treatment Patient Details Name: Melanie Juarez MRN: [id number] DOB: 1945-03-04 Today's Date: 03/25/2022   History of Present Illness Pt is a 77 y.o. 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  MD assessement also includes AKI, hypomagnesemia/hypophosphatemia, and acute on chronic anemia.    PT Comments    Pt asking to return to bed.  She is able to lateral scoot back to bed to left with drop arm recliner.  She does need min assist due to transitioning to a softer surface and once on bed does roll into bed vs continue to lateral scoot due to fatigue and some c/o dizziness with effort.     Recommendations for follow up therapy are one component of a multi-disciplinary discharge planning process, led by the attending physician.  Recommendations may be updated based on patient status, additional functional criteria and insurance authorization.  Follow Up Recommendations  Skilled nursing-short term rehab (<3 hours/day) Can patient physically be transported by private vehicle: No   Assistance Recommended at Discharge Frequent or constant Supervision/Assistance  Patient can return home with the following Assistance with cooking/housework;Direct supervision/assist for medications management;Assist for transportation;Help with stairs or ramp for entrance;A little help with walking and/or transfers;A little help with bathing/dressing/bathroom   Equipment Recommendations  Other (comment) (TBD at next venue of care)    Recommendations for Other Services       Precautions / Restrictions Precautions Precautions: Fall Restrictions Weight Bearing Restrictions: Yes RLE Weight Bearing: Non weight bearing      Mobility  Bed Mobility Overal bed mobility: Modified Independent                  Transfers Overall transfer level: Needs assistance Equipment used: None Transfers: Bed to chair/wheelchair/BSC            Lateral/Scoot Transfers: Min guard, Min assist General transfer comment: some increased assist back to bed due to transition to a softer surface.    Ambulation/Gait               General Gait Details: Unable/unsafe to attempt   Stairs             Wheelchair Mobility    Modified Rankin (Stroke Patients Only)       Balance Overall balance assessment: Needs assistance   Sitting balance-Leahy Scale: Good                                      Cognition Arousal/Alertness: Awake/alert   Overall Cognitive Status: Within Functional Limits for tasks assessed                                          Exercises Total Joint Exercises Ankle Circles/Pumps: AROM, Strengthening, Left, 10 reps Quad Sets: Strengthening, Both, 5 reps, 10 reps Gluteal Sets: Strengthening, Both, 10 reps Hip ABduction/ADduction: AROM, Strengthening, Both, 5 reps Straight Leg Raises: AROM, Strengthening, Both, 5 reps Long Arc Quad: AROM, Strengthening, Both, 5 reps, 10 reps Knee Flexion: AROM, Strengthening, Both, 5 reps, 10 reps    General Comments  Pertinent Vitals/Pain Pain Assessment Pain Assessment: Faces Faces Pain Scale: Hurts a little bit Pain Location: R LE - generally comfortable at rest but increased with activity Pain Descriptors / Indicators: Sore, Aching Pain Intervention(s): Limited activity within patient's tolerance, Monitored during session, Repositioned    Home Living                          Prior Function            PT Goals (current goals can now be found in the care plan section) Progress towards PT goals: Progressing toward goals    Frequency    7X/week      PT Plan       Co-evaluation              AM-PAC PT "6 Clicks" Mobility   Outcome Measure  Help needed turning from your back to your side while in a flat bed without using bedrails?: A Little Help needed moving from lying on your back to sitting on the side of a flat bed without using bedrails?: A Little Help needed moving to and from a bed to a chair (including a wheelchair)?: A Little Help needed standing up from a chair using your arms (e.g., wheelchair or bedside chair)?: A Lot Help needed to walk in hospital room?: Total Help needed climbing 3-5 steps with a railing? : Total 6 Click Score: 13    End of Session Equipment Utilized During Treatment: Gait belt Activity Tolerance: Patient tolerated treatment well Patient left: with call bell/phone within reach;in chair;with chair alarm set Nurse Communication: Mobility status PT Visit Diagnosis: Difficulty in walking, not elsewhere classified (R26.2);Muscle weakness (generalized) (M62.81)     Time: [id number] PT Time Calculation (min) (ACUTE ONLY): 9 min  Charges:  $Therapeutic Activity: 8-22 mins                   Chesley Noon, PTA 03/25/22, 1:21 PM

## 2022-03-25 NOTE — Progress Notes (Signed)
Physical Therapy Treatment Patient Details Name: Melanie Juarez MRN: [id number] DOB: 02-08-1946 Today's Date: 03/25/2022   History of Present Illness Pt is a 77 y.o. 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  MD assessement also includes AKI, hypomagnesemia/hypophosphatemia, and acute on chronic anemia.    PT Comments    Participated in exercises as described below.  Initially declined OOB but is willing to try.  To EOB with supervision.  Steady in sitting.  Lateral scoot transfer to drop arm recliner with time but no physical assist.  Overall does quite well.  SBA for safety.  She is comfortable in chair and encouraged ot remain up in chair for lunch.     Recommendations for follow up therapy are one component of a multi-disciplinary discharge planning process, led by the attending physician.  Recommendations may be updated based on patient status, additional functional criteria and insurance authorization.  Follow Up Recommendations  Skilled nursing-short term rehab (<3 hours/day) Can patient physically be transported by private vehicle: No   Assistance Recommended at Discharge Frequent or constant Supervision/Assistance  Patient can return home with the following Assistance with cooking/housework;Direct supervision/assist for medications management;Assist for transportation;Help with stairs or ramp for entrance;A little help with walking and/or transfers;A little help with bathing/dressing/bathroom   Equipment Recommendations  Other (comment) (TBD at next venue of care)    Recommendations for Other Services       Precautions / Restrictions Precautions Precautions: Fall Restrictions Weight Bearing Restrictions:  Yes RLE Weight Bearing: Non weight bearing     Mobility  Bed Mobility Overal bed mobility: Modified Independent                  Transfers Overall transfer level: Needs assistance Equipment used: None Transfers: Bed to chair/wheelchair/BSC            Lateral/Scoot Transfers: Min guard General transfer comment: lateral scoot to drop arm recliner at bedside with set up and min guard/supervision/verbal cues.    Ambulation/Gait                   Stairs             Wheelchair Mobility    Modified Rankin (Stroke Patients Only)       Balance Overall balance assessment: Needs assistance   Sitting balance-Leahy Scale: Good                                      Cognition Arousal/Alertness: Awake/alert   Overall Cognitive Status: Within Functional Limits for tasks assessed                                          Exercises Total Joint Exercises Ankle Circles/Pumps: AROM, Strengthening, Left, 10 reps Quad Sets: Strengthening, Both, 5 reps, 10 reps Gluteal Sets: Strengthening, Both, 10 reps Hip ABduction/ADduction: AROM, Strengthening, Both, 5 reps Straight Leg Raises: AROM, Strengthening, Both, 5 reps Long Arc Quad: AROM, Strengthening, Both, 5 reps, 10 reps Knee Flexion: AROM, Strengthening, Both, 5 reps, 10 reps    General Comments  Pertinent Vitals/Pain Pain Assessment Pain Assessment: Faces Faces Pain Scale: Hurts a little bit Pain Location: R LE - generally comfortable at rest but increased with activity Pain Descriptors / Indicators: Sore, Aching Pain Intervention(s): Premedicated before session    Home Living                          Prior Function            PT Goals (current goals can now be found in the care plan section) Progress towards PT goals: Progressing toward goals    Frequency    7X/week      PT Plan      Co-evaluation              AM-PAC PT "6  Clicks" Mobility   Outcome Measure  Help needed turning from your back to your side while in a flat bed without using bedrails?: A Little Help needed moving from lying on your back to sitting on the side of a flat bed without using bedrails?: A Little Help needed moving to and from a bed to a chair (including a wheelchair)?: A Little Help needed standing up from a chair using your arms (e.g., wheelchair or bedside chair)?: A Lot Help needed to walk in hospital room?: Total Help needed climbing 3-5 steps with a railing? : Total 6 Click Score: 13    End of Session Equipment Utilized During Treatment: Gait belt Activity Tolerance: Patient tolerated treatment well Patient left: with call bell/phone within reach;in chair;with chair alarm set Nurse Communication: Mobility status PT Visit Diagnosis: Difficulty in walking, not elsewhere classified (R26.2);Muscle weakness (generalized) (M62.81)     Time: [id number] PT Time Calculation (min) (ACUTE ONLY): 15 min  Charges:  $Therapeutic Activity: 8-22 mins                   Chesley Noon, PTA 03/25/22, 11:29 AM

## 2022-03-25 NOTE — H&P (View-Only) (Signed)
Subjective  - POD #3  No complaints today   Physical Exam:  Dressing remains intact       Assessment/Plan:  POD # 3  Discussed with patient, proceeding to the operating room tomorrow for further management of her open right below-knee amputation.  We discussed the possibility of wound closure as a below-knee amputation, versus an above-knee amputation, versus continued wound care.  She will be n.p.o. after midnight.  All questions answered.  Continue antibiotics  Wells Vestal Markin 03/25/2022 7:05 PM --  Vitals:   03/25/22 0825 03/25/22 1526  BP: (!) 128/54 (!) 122/56  Pulse: 76 76  Resp: 18 20  Temp: 97.9 F (36.6 C) 98.5 F (36.9 C)  SpO2: 100% 100%    Intake/Output Summary (Last 24 hours) at 03/25/2022 1905 Last data filed at 03/25/2022 1500 Gross per 24 hour  Intake 13.22 ml  Output 950 ml  Net -936.78 ml     Laboratory CBC    Component Value Date/Time   WBC 12.0 (H) 03/25/2022 0535   HGB 8.1 (L) 03/25/2022 0535   HCT 25.0 (L) 03/25/2022 0535   PLT 322 03/25/2022 0535    BMET    Component Value Date/Time   NA 134 (L) 03/25/2022 0535   K 4.0 03/25/2022 0535   CL 105 03/25/2022 0535   CO2 24 03/25/2022 0535   GLUCOSE 148 (H) 03/25/2022 0535   BUN 16 03/25/2022 0535   CREATININE 0.87 03/25/2022 0535   CALCIUM 7.8 (L) 03/25/2022 0535   GFRNONAA >60 03/25/2022 0535    COAG No results found for: "INR", "PROTIME" No results found for: "PTT"  Antibiotics Anti-infectives (From admission, onward)    Start     Dose/Rate Route Frequency Ordered Stop   03/25/22 1700  vancomycin (VANCOCIN) IVPB 1000 mg/200 mL premix        1,000 mg 200 mL/hr over 60 Minutes Intravenous Every 24 hours 03/25/22 1639     03/22/22 1555  vancomycin (VANCOCIN) powder  Status:  Discontinued          As needed 03/22/22 1606 03/22/22 1619   03/21/22 1800  vancomycin (VANCOREADY) IVPB 1250 mg/250 mL  Status:  Discontinued        1,250 mg 166.7 mL/hr over 90 Minutes  Intravenous Every 24 hours 03/21/22 1350 03/25/22 1635   03/21/22 1500  ceFEPIme (MAXIPIME) 2 g in sodium chloride 0.9 % 100 mL IVPB        2 g 200 mL/hr over 30 Minutes Intravenous Every 8 hours 03/21/22 1350     03/21/22 0727  ceFAZolin (ANCEF) 2-4 GM/100ML-% IVPB       Note to Pharmacy: Maynor, Erin A: cabinet override      03/21/22 0727 03/21/22 1944   03/21/22 0257  ceFAZolin (ANCEF) IVPB 2g/100 mL premix        2 g 200 mL/hr over 30 Minutes Intravenous 30 min pre-op 03/21/22 0257 03/22/22 0731   03/20/22 1800  vancomycin (VANCOCIN) IVPB 1000 mg/200 mL premix  Status:  Discontinued        1,000 mg 200 mL/hr over 60 Minutes Intravenous Every 24 hours 03/19/22 1644 03/21/22 1350   03/19/22 1600  ceFEPIme (MAXIPIME) 2 g in sodium chloride 0.9 % 100 mL IVPB  Status:  Discontinued        2 g 200 mL/hr over 30 Minutes Intravenous Every 12 hours 03/19/22 1500 03/21/22 1350   03/19/22 1445  vancomycin (VANCOREADY) IVPB 2000 mg/400 mL  2,000 mg 200 mL/hr over 120 Minutes Intravenous  Once 03/19/22 1439 03/19/22 2155   03/19/22 1430  cefTRIAXone (ROCEPHIN) 2 g in sodium chloride 0.9 % 100 mL IVPB  Status:  Discontinued        2 g 200 mL/hr over 30 Minutes Intravenous Every 24 hours 03/19/22 1426 03/19/22 1450   03/19/22 1430  metroNIDAZOLE (FLAGYL) IVPB 500 mg        500 mg 100 mL/hr over 60 Minutes Intravenous Every 12 hours 03/19/22 1426 03/26/22 1429        V. Leia Alf, M.D., [facility] [place] Office: [phone] Pager:  [phone]

## 2022-03-25 NOTE — TOC Initial Note (Signed)
Transition of Care [facility]) - Initial/Assessment Note    Patient Details  Name: Melanie Juarez MRN: [id number] Date of Birth: [birth year]  Transition of Care [facility]) CM/SW Contact:    Candie Chroman, LCSW Phone Number: 03/25/2022, 1:42 PM  Clinical Narrative:  CSW met with patient. Family at bedside. CSW introduced role and explained that PT recommendations would be discussed. Patient and family are agreeable to SNF. First preference is [facility], second preference is [facility]. Will wait until after tomorrow's procedure to start SNF workup to have a better idea of wound care needs. No further concerns. CSW encouraged patient and her family to contact CSW as needed. CSW will continue to follow patient and her family for support and facilitate discharge to SNF once medically stable.                Expected Discharge Plan: Skilled Nursing Facility Barriers to Discharge: Continued Medical Work up   Patient Goals and CMS Choice   CMS Medicare.gov Compare Post Acute Care list provided to:: Patient        Expected Discharge Plan and Services     Post Acute Care Choice: [place] Living arrangements for the past 2 months: Single Family Home                                      Prior Living Arrangements/Services Living arrangements for the past 2 months: Single Family Home   Patient language and need for interpreter reviewed:: Yes Do you feel safe going back to the place where you live?: Yes      Need for Family Participation in Patient Care: Yes (Comment) Care giver support system in place?: Yes (comment)   Criminal Activity/Legal Involvement Pertinent to Current Situation/Hospitalization: No - Comment as needed  Activities of Daily Living Home Assistive Devices/Equipment: None ADL Screening (condition at time of admission) Patient's cognitive ability adequate to safely complete daily activities?: Yes Is the patient deaf or have difficulty hearing?:  No Does the patient have difficulty seeing, even when wearing glasses/contacts?: No Does the patient have difficulty concentrating, remembering, or making decisions?: No Patient able to express need for assistance with ADLs?: Yes Does the patient have difficulty dressing or bathing?: No Independently performs ADLs?: Yes (appropriate for developmental age) Does the patient have difficulty walking or climbing stairs?: Yes Weakness of Legs: Both Weakness of Arms/Hands: None  Permission Sought/Granted Permission sought to share information with : Facility Sport and exercise psychologist, Family Supports Permission granted to share information with : Yes, Verbal Permission Granted     Permission granted to share info w AGENCY: SNF's  Permission granted to share info w Relationship: Family at bedside     Emotional Assessment Appearance:: Appears stated age Attitude/Demeanor/Rapport: Engaged, Gracious Affect (typically observed): Accepting, Appropriate, Calm, Pleasant Orientation: : Oriented to Self, Oriented to Place, Oriented to  Time, Oriented to Situation Alcohol / Substance Use: Not Applicable Psych Involvement: No (comment)  Admission diagnosis:  Osteomyelitis (HCC) [M86.9] Diabetic infection of right foot ([place]) [id number], L08.9] Patient Active Problem List   Diagnosis Date Noted   Lactic acidosis 03/21/2022   Obesity (BMI 30-39.9) 03/21/2022   PAD (peripheral artery disease) ([place]) 03/21/2022   Osteomyelitis ([place]) 03/19/2022   Diabetes mellitus type 2, noninsulin dependent ([place]) 03/19/2022   Essential hypertension 03/19/2022   Leukocytosis 03/19/2022   AKI (acute kidney injury) ([place]) 03/19/2022   Hyperlipidemia 05/17/2014  PCP:  System, Provider Not In Pharmacy:   [id number] [place] [address] [id number]: [phone]: [phone]  [id number] [place], [address]:  [phone]: [phone]   Social Determinants of Health (SDOH) Social History: SDOH Screenings   Food Insecurity: No Food Insecurity (03/20/2022)  Housing: Low Risk  (03/20/2022)  Transportation Needs: No Transportation Needs (03/20/2022)  Utilities: Not At Risk (03/20/2022)  Tobacco Use: Low Risk  (03/23/2022)   SDOH Interventions:     Readmission Risk Interventions     No data to display

## 2022-03-25 NOTE — Progress Notes (Signed)
PROGRESS NOTE  Melanie Juarez  DOB: [birth year]  PCP: System, Provider Not In [id number]  DOA: 03/19/2022  LOS: 6 days  Hospital Day: 7  Brief narrative: Melanie Juarez is a 77 y.o. 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 2/5, patient was directed to ED from podiatry office with concern of right ankle/foot osteomyelitis MRI right foot on admission confirmed with suspicion. Blood culture obtained Started on broad-spectrum IV antibiotics Admitted to [place] and vascular surgery consulted. 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    Blood cultures NGTD.   Remains on broadspectrum antibiotics.   Plan for return to the OR for skin closure on Monday.  Subjective: Patient was seen and examined this afternoon.  Elderly African-American female.  Lying on bed.  Not in distress.  No new symptoms. Labs from this morning with WBC count improving to 12, hemoglobin improved to 8.1.  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 Currently on IV cefepime, IV Flagyl and IV vancomycin.  Blood culture no growth till date. Noted to plan for return to the OR for skin closure on Monday 2/12.  Wound VAC in place. WBC count improving.  Lactic acid level improved. Pain control with oral and IV opioids as needed Recent Labs  Lab 03/19/22 1246 03/19/22 1811 03/19/22 2201 03/20/22 1013 03/21/22 0539 03/22/22 0459 03/23/22 0318 03/24/22 0532 03/25/22 0535  WBC 19.2*  --   --  14.3* 13.8* 14.1* 14.7* 13.6* 12.0*  LATICACIDVEN 1.4 2.6* 1.3 0.8  --   --   --   --   --    Type 2 diabetes mellitus uncontrolled with hyperglycemia A1c elevated to 11.9 on 03/20/2022 PTA on metformin 1000 mg twice daily, glipizide 5 mg twice  daily.  Currently on hold. Blood sugar level was low at 67 yesterday.  Patient felt dizzy and somnolent.  I subsequently stopped her scheduled Premeal insulin. Currently on Semglee 20 units daily, and sliding scale insulin. Recent Labs  Lab 03/24/22 1644 03/24/22 1726 03/24/22 2113 03/25/22 0827 03/25/22 1204  GLUCAP 67* 74 188* 131* 127*   PAD s/p revascularization as above Hyperlipidemia Continue Lipitor 40 mg daily  Hypomagnesemia/hypophosphatemia Phosphorus level low at 1.8 this morning.  Replacement ordered Recent Labs  Lab 03/20/22 1013 03/21/22 0539 03/22/22 0459 03/24/22 0532 03/25/22 0535  K 3.8 3.5 3.6 3.5 4.0  MG  --   --  1.2* 1.8  --   PHOS  --   --  2.5 1.8*  --    AKI Creatinine elevated to 1.36 on admission.  Improved with hydration. Recent Labs    03/19/22 1246 03/20/22 1013 03/21/22 0539 03/22/22 0459 03/24/22 0532 03/25/22 0535  BUN 28* 21 15 16 15 16  $ CREATININE 1.36* 0.91 0.84 0.76 0.77 0.87   Essential hypertension PTA on atenolol 50 mg daily, chlorthalidone 25 mg daily Continue atenolol.  Chlorthalidone on hold. IV hydralazine as needed  Acute on chronic anemia Hemoglobin more than 10 at baseline.  8.1 today. Recent Labs    03/21/22 0539 03/22/22 0459 03/23/22 0318 03/24/22 0532 03/25/22 0535  HGB 8.1* 8.1* 8.1* 7.5* 8.1*  MCV 82.2 82.1 82.6 83.4 83.3    Obesity  Body mass index is 32.76 kg/m. Patient has been advised to make an  attempt to improve diet and exercise patterns to aid in weight loss.   Mobility: PT eval obtained  Goals of care   Code Status: Full Code     DVT prophylaxis:  enoxaparin (LOVENOX) injection 40 mg Start: 03/24/22 2200 Place TED hose Start: 03/19/22 1447   Antimicrobials: IV antibiotics Fluid: None currently Consultants: Vascular surgery Family Communication: None at bedside  Status is: Inpatient Level of care: Med-Surg   Dispo: Patient is from: Home              Anticipated d/c is to:  Pending clinical course Continue inhospital care because: Pending second stage surgery, pending PT eval   Scheduled Meds:  atenolol  50 mg Oral Daily   atorvastatin  40 mg Oral Daily   enoxaparin (LOVENOX) injection  40 mg Subcutaneous Q24H   insulin aspart  0-15 Units Subcutaneous TID WC   insulin aspart  0-5 Units Subcutaneous QHS   insulin glargine-yfgn  20 Units Subcutaneous Daily   Ensure Max Protein  11 oz Oral BID    PRN meds: diphenhydrAMINE, famotidine, midazolam, morphine injection, ondansetron (ZOFRAN) IV, ondansetron (ZOFRAN) IV, oxyCODONE-acetaminophen, promethazine (PHENERGAN) injection (IM or IVPB)   Infusions:   ceFEPime (MAXIPIME) IV 2 g (03/25/22 0630)   metronidazole 500 mg (03/25/22 0218)   promethazine (PHENERGAN) injection (IM or IVPB)     vancomycin 1,250 mg (03/24/22 1728)    Diet:  Diet Order             Diet Carb Modified Fluid consistency: Thin; Room service appropriate? Yes  Diet effective now                   Antimicrobials: Anti-infectives (From admission, onward)    Start     Dose/Rate Route Frequency Ordered Stop   03/22/22 1555  vancomycin (VANCOCIN) powder  Status:  Discontinued          As needed 03/22/22 1606 03/22/22 1619   03/21/22 1800  vancomycin (VANCOREADY) IVPB 1250 mg/250 mL        1,250 mg 166.7 mL/hr over 90 Minutes Intravenous Every 24 hours 03/21/22 1350     03/21/22 1500  ceFEPIme (MAXIPIME) 2 g in sodium chloride 0.9 % 100 mL IVPB        2 g 200 mL/hr over 30 Minutes Intravenous Every 8 hours 03/21/22 1350     03/21/22 0727  ceFAZolin (ANCEF) 2-4 GM/100ML-% IVPB       Note to Pharmacy: Maynor, Junie Panning A: cabinet override      03/21/22 0727 03/21/22 1944   03/21/22 0257  ceFAZolin (ANCEF) IVPB 2g/100 mL premix        2 g 200 mL/hr over 30 Minutes Intravenous 30 min pre-op 03/21/22 0257 03/22/22 0731   03/20/22 1800  vancomycin (VANCOCIN) IVPB 1000 mg/200 mL premix  Status:  Discontinued        1,000 mg 200 mL/hr  over 60 Minutes Intravenous Every 24 hours 03/19/22 1644 03/21/22 1350   03/19/22 1600  ceFEPIme (MAXIPIME) 2 g in sodium chloride 0.9 % 100 mL IVPB  Status:  Discontinued        2 g 200 mL/hr over 30 Minutes Intravenous Every 12 hours 03/19/22 1500 03/21/22 1350   03/19/22 1445  vancomycin (VANCOREADY) IVPB 2000 mg/400 mL        2,000 mg 200 mL/hr over 120 Minutes Intravenous  Once 03/19/22 1439 03/19/22 2155   03/19/22 1430  cefTRIAXone (ROCEPHIN) 2 g in sodium chloride 0.9 %  100 mL IVPB  Status:  Discontinued        2 g 200 mL/hr over 30 Minutes Intravenous Every 24 hours 03/19/22 1426 03/19/22 1450   03/19/22 1430  metroNIDAZOLE (FLAGYL) IVPB 500 mg        500 mg 100 mL/hr over 60 Minutes Intravenous Every 12 hours 03/19/22 1426 03/26/22 1429       Skin assessment:       Nutritional status:  Body mass index is 32.76 kg/m.          Objective: Vitals:   03/25/22 0633 03/25/22 0825  BP: (!) 124/54 (!) 128/54  Pulse: 75 76  Resp: 20 18  Temp: 98.6 F (37 C) 97.9 F (36.6 C)  SpO2: 99% 100%    Intake/Output Summary (Last 24 hours) at 03/25/2022 1448 Last data filed at 03/25/2022 0535 Gross per 24 hour  Intake 398.21 ml  Output 950 ml  Net -551.79 ml   Filed Weights   03/19/22 1446 03/22/22 1209 03/24/22 0415  Weight: 90.7 kg 90.7 kg 96.3 kg   Weight change:  Body mass index is 32.76 kg/m.   Physical Exam: General exam: Pleasant, elderly African-American female.  Not in distress Skin: No rashes, lesions or ulcers. HEENT: Atraumatic, normocephalic, no obvious bleeding Lungs: Clear to auscultation bilaterally CVS: Regular rate and rhythm, no murmur GI/Abd soft nontender, nondistended, bowel sound present CNS: Alert, awake, oriented x 3 Psychiatry: Mood appropriate Extremities: Right BKA status.  Wound VAC in place  Data Review: I have personally reviewed the laboratory data and studies available.  F/u labs ordered Unresulted Labs (From admission,  onward)     Start     Ordered   03/25/22 1600  Vancomycin, trough  Once-Timed,   TIMED       Comments: *NOTE* A vancomycin trough is ordered for 1600 on 2/11 (date).  Please do not give the 1700 dose until after the trough is drawn. May give dose after trough drawn unless otherwise instructed by pharmacist or physician. Thanks, pharmacy   Question:  Specimen collection method  Answer:  Lab=Lab collect   03/24/22 1744            Total time spent in review of labs and imaging, patient evaluation, formulation of plan, documentation and communication with family: 8 minutes  Signed, Terrilee Croak, MD [facility] 03/25/2022

## 2022-03-25 NOTE — Progress Notes (Signed)
Subjective  - POD #3  No complaints today   Physical Exam:  Dressing remains intact       Assessment/Plan:  POD # 3  Discussed with patient, proceeding to the operating room tomorrow for further management of her open right below-knee amputation.  We discussed the possibility of wound closure as a below-knee amputation, versus an above-knee amputation, versus continued wound care.  She will be n.p.o. after midnight.  All questions answered.  Continue antibiotics  [facility] Brevin Mcfadden 03/25/2022 7:05 PM --  Vitals:   03/25/22 0825 03/25/22 1526  BP: (!) 128/54 (!) 122/56  Pulse: 76 76  Resp: 18 20  Temp: 97.9 F (36.6 C) 98.5 F (36.9 C)  SpO2: 100% 100%    Intake/Output Summary (Last 24 hours) at 03/25/2022 1905 Last data filed at 03/25/2022 1500 Gross per 24 hour  Intake 13.22 ml  Output 950 ml  Net -936.78 ml     Laboratory CBC    Component Value Date/Time   WBC 12.0 (H) 03/25/2022 0535   HGB 8.1 (L) 03/25/2022 0535   HCT 25.0 (L) 03/25/2022 0535   PLT 322 03/25/2022 0535    BMET    Component Value Date/Time   NA 134 (L) 03/25/2022 0535   K 4.0 03/25/2022 0535   CL 105 03/25/2022 0535   CO2 24 03/25/2022 0535   GLUCOSE 148 (H) 03/25/2022 0535   BUN 16 03/25/2022 0535   CREATININE 0.87 03/25/2022 0535   CALCIUM 7.8 (L) 03/25/2022 0535   GFRNONAA >60 03/25/2022 0535    COAG No results found for: "INR", "PROTIME" No results found for: "PTT"  Antibiotics Anti-infectives (From admission, onward)    Start     Dose/Rate Route Frequency Ordered Stop   03/25/22 1700  vancomycin (VANCOCIN) IVPB 1000 mg/200 mL premix        1,000 mg 200 mL/hr over 60 Minutes Intravenous Every 24 hours 03/25/22 1639     03/22/22 1555  vancomycin (VANCOCIN) powder  Status:  Discontinued          As needed 03/22/22 1606 03/22/22 1619   03/21/22 1800  vancomycin (VANCOREADY) IVPB 1250 mg/250 mL  Status:  Discontinued        1,250 mg 166.7 mL/hr over 90 Minutes  Intravenous Every 24 hours 03/21/22 1350 03/25/22 1635   03/21/22 1500  ceFEPIme (MAXIPIME) 2 g in sodium chloride 0.9 % 100 mL IVPB        2 g 200 mL/hr over 30 Minutes Intravenous Every 8 hours 03/21/22 1350     03/21/22 0727  ceFAZolin (ANCEF) 2-4 GM/100ML-% IVPB       Note to Pharmacy: Maynor, Erin A: cabinet override      03/21/22 0727 03/21/22 1944   03/21/22 0257  ceFAZolin (ANCEF) IVPB 2g/100 mL premix        2 g 200 mL/hr over 30 Minutes Intravenous 30 min pre-op 03/21/22 0257 03/22/22 0731   03/20/22 1800  vancomycin (VANCOCIN) IVPB 1000 mg/200 mL premix  Status:  Discontinued        1,000 mg 200 mL/hr over 60 Minutes Intravenous Every 24 hours 03/19/22 1644 03/21/22 1350   03/19/22 1600  ceFEPIme (MAXIPIME) 2 g in sodium chloride 0.9 % 100 mL IVPB  Status:  Discontinued        2 g 200 mL/hr over 30 Minutes Intravenous Every 12 hours 03/19/22 1500 03/21/22 1350   03/19/22 1445  vancomycin (VANCOREADY) IVPB 2000 mg/400 mL  2,000 mg 200 mL/hr over 120 Minutes Intravenous  Once 03/19/22 1439 03/19/22 2155   03/19/22 1430  cefTRIAXone (ROCEPHIN) 2 g in sodium chloride 0.9 % 100 mL IVPB  Status:  Discontinued        2 g 200 mL/hr over 30 Minutes Intravenous Every 24 hours 03/19/22 1426 03/19/22 1450   03/19/22 1430  metroNIDAZOLE (FLAGYL) IVPB 500 mg        500 mg 100 mL/hr over 60 Minutes Intravenous Every 12 hours 03/19/22 1426 03/26/22 1429        V. Leia Alf, M.D., [facility] [place] Office: [phone] Pager:  [phone]

## 2022-03-26 ENCOUNTER — Inpatient Hospital Stay: Payer: Medicare Other | Admitting: General Practice

## 2022-03-26 ENCOUNTER — Other Ambulatory Visit: Payer: Self-pay

## 2022-03-26 ENCOUNTER — Encounter: Payer: Self-pay | Admitting: Internal Medicine

## 2022-03-26 ENCOUNTER — Encounter
Admission: EM | Disposition: A | Discharge status: Skilled Nursing Facility | Payer: Medicare Other | Source: Home / Self Care | Attending: Internal Medicine

## 2022-03-26 DIAGNOSIS — M869 Osteomyelitis, unspecified: Secondary | ICD-10-CM | POA: Diagnosis not present

## 2022-03-26 HISTORY — PX: STUMP REVISION: SHX6102

## 2022-03-26 LAB — GLUCOSE, CAPILLARY
Glucose-Capillary: 107 mg/dL — ABNORMAL HIGH (ref 70–99)
Glucose-Capillary: 111 mg/dL — ABNORMAL HIGH (ref 70–99)
Glucose-Capillary: 160 mg/dL — ABNORMAL HIGH (ref 70–99)
Glucose-Capillary: 203 mg/dL — ABNORMAL HIGH (ref 70–99)
Glucose-Capillary: 187 mg/dL — ABNORMAL HIGH (ref 70–99)
Glucose-Capillary: 209 mg/dL — ABNORMAL HIGH (ref 70–99)

## 2022-03-26 LAB — CREATININE, SERUM
Creatinine, Ser: 0.85 mg/dL (ref 0.44–1.00)
GFR, Estimated: >60 mL/min (ref >60)

## 2022-03-26 LAB — SURGICAL PATHOLOGY

## 2022-03-26 SURGERY — REVISION, AMPUTATION SITE
Anesthesia: General | Site: Knee | Laterality: Right

## 2022-03-26 MED ORDER — LIDOCAINE HCL (CARDIAC) PF 100 MG/5ML IV SOSY
PREFILLED_SYRINGE | INTRAVENOUS | Status: DC | PRN
  Administered 2022-03-26: 80 mg via INTRAVENOUS

## 2022-03-26 MED ORDER — SUGAMMADEX SODIUM 200 MG/2ML IV SOLN
INTRAVENOUS | Status: DC | PRN
  Administered 2022-03-26: 200 mg via INTRAVENOUS

## 2022-03-26 MED ORDER — FENTANYL CITRATE (PF) 100 MCG/2ML IJ SOLN
INTRAMUSCULAR | Status: AC
  Filled 2022-03-26: qty 2

## 2022-03-26 MED ORDER — HYDROMORPHONE HCL 1 MG/ML IJ SOLN
INTRAMUSCULAR | Status: AC
  Filled 2022-03-26: qty 1

## 2022-03-26 MED ORDER — OXYCODONE HCL 5 MG/5ML PO SOLN: 5.0000 mg | Freq: Once | ORAL | Status: AC | PRN

## 2022-03-26 MED ORDER — OXYCODONE HCL 5 MG PO TABS
5.0000 mg | ORAL_TABLET | Freq: Once | ORAL | Status: AC | PRN
  Administered 2022-03-26: 5 mg via ORAL

## 2022-03-26 MED ORDER — ROCURONIUM BROMIDE 100 MG/10ML IV SOLN
INTRAVENOUS | Status: DC | PRN
  Administered 2022-03-26: 40 mg via INTRAVENOUS

## 2022-03-26 MED ORDER — ONDANSETRON HCL 4 MG/2ML IJ SOLN
INTRAMUSCULAR | Status: AC
  Filled 2022-03-26: qty 2

## 2022-03-26 MED ORDER — PROPOFOL 10 MG/ML IV BOLUS
INTRAVENOUS | Status: DC | PRN
  Administered 2022-03-26: 120 mg via INTRAVENOUS

## 2022-03-26 MED ORDER — HYDROMORPHONE HCL 1 MG/ML IJ SOLN
INTRAMUSCULAR | Status: DC | PRN
  Administered 2022-03-26 (×2): .5 mg via INTRAVENOUS

## 2022-03-26 MED ORDER — DEXAMETHASONE SODIUM PHOSPHATE 10 MG/ML IJ SOLN
INTRAMUSCULAR | Status: DC | PRN
  Administered 2022-03-26: 5 mg via INTRAVENOUS

## 2022-03-26 MED ORDER — PHENYLEPHRINE HCL-NACL 20-0.9 MG/250ML-% IV SOLN
INTRAVENOUS | Status: AC
  Filled 2022-03-26: qty 250

## 2022-03-26 MED ORDER — SEVOFLURANE IN SOLN
RESPIRATORY_TRACT | Status: AC
  Filled 2022-03-26: qty 250

## 2022-03-26 MED ORDER — ONDANSETRON HCL 4 MG/2ML IJ SOLN
INTRAMUSCULAR | Status: DC | PRN
  Administered 2022-03-26: 4 mg via INTRAVENOUS

## 2022-03-26 MED ORDER — PHENYLEPHRINE HCL-NACL 20-0.9 MG/250ML-% IV SOLN
INTRAVENOUS | Status: DC | PRN
  Administered 2022-03-26: 30 ug/min via INTRAVENOUS

## 2022-03-26 MED ORDER — FENTANYL CITRATE (PF) 100 MCG/2ML IJ SOLN
INTRAMUSCULAR | Status: DC | PRN
  Administered 2022-03-26 (×2): 50 ug via INTRAVENOUS

## 2022-03-26 MED ORDER — CHLORHEXIDINE GLUCONATE 0.12 % MT SOLN
OROMUCOSAL | Status: AC
  Filled 2022-03-26: qty 15

## 2022-03-26 MED ORDER — 0.9 % SODIUM CHLORIDE (POUR BTL) OPTIME
TOPICAL | Status: DC | PRN
  Administered 2022-03-26: 1000 mL

## 2022-03-26 MED ORDER — LIDOCAINE HCL (PF) 2 % IJ SOLN
INTRAMUSCULAR | Status: AC
  Filled 2022-03-26: qty 5

## 2022-03-26 MED ORDER — FENTANYL CITRATE (PF) 100 MCG/2ML IJ SOLN
25.0000 ug | INTRAMUSCULAR | Status: DC | PRN
  Administered 2022-03-26: 50 ug via INTRAVENOUS
  Administered 2022-03-26 (×2): 25 ug via INTRAVENOUS
  Administered 2022-03-26: 50 ug via INTRAVENOUS

## 2022-03-26 MED ORDER — DEXAMETHASONE SODIUM PHOSPHATE 10 MG/ML IJ SOLN
INTRAMUSCULAR | Status: AC
  Filled 2022-03-26: qty 1

## 2022-03-26 MED ORDER — ONDANSETRON HCL 4 MG/2ML IJ SOLN
4.0000 mg | Freq: Once | INTRAMUSCULAR | Status: AC
  Administered 2022-03-26: 4 mg via INTRAVENOUS

## 2022-03-26 MED ORDER — INSULIN GLARGINE-YFGN 100 UNIT/ML ~~LOC~~ SOLN
5.0000 [IU] | Freq: Every day | SUBCUTANEOUS | Status: DC
  Administered 2022-03-28: 5 [IU] via SUBCUTANEOUS
  Filled 2022-03-26 (×2): qty 0.05

## 2022-03-26 MED ORDER — GLIPIZIDE 5 MG PO TABS
5.0000 mg | ORAL_TABLET | Freq: Two times a day (BID) | ORAL | Status: DC
  Administered 2022-03-27 – 2022-03-28 (×3): 5 mg via ORAL
  Filled 2022-03-26 (×3): qty 1

## 2022-03-26 MED ORDER — PHENYLEPHRINE 80 MCG/ML (10ML) SYRINGE FOR IV PUSH (FOR BLOOD PRESSURE SUPPORT)
PREFILLED_SYRINGE | INTRAVENOUS | Status: DC | PRN
  Administered 2022-03-26 (×2): 160 ug via INTRAVENOUS
  Administered 2022-03-26: 80 ug via INTRAVENOUS
  Administered 2022-03-26: 160 ug via INTRAVENOUS

## 2022-03-26 MED ORDER — PROPOFOL 10 MG/ML IV BOLUS
INTRAVENOUS | Status: AC
  Filled 2022-03-26: qty 20

## 2022-03-26 MED ORDER — OXYCODONE HCL 5 MG PO TABS
ORAL_TABLET | ORAL | Status: AC
  Filled 2022-03-26: qty 1

## 2022-03-26 MED ORDER — CHLORHEXIDINE GLUCONATE 0.12 % MT SOLN
15.0000 mL | Freq: Once | OROMUCOSAL | Status: AC
  Administered 2022-03-26: 15 mL via OROMUCOSAL

## 2022-03-26 MED ORDER — METFORMIN HCL 500 MG PO TABS
1000.0000 mg | ORAL_TABLET | Freq: Two times a day (BID) | ORAL | Status: DC
  Administered 2022-03-27 (×2): 1000 mg via ORAL
  Filled 2022-03-26 (×2): qty 2

## 2022-03-26 SURGICAL SUPPLY — 37 items
BLADE SAW SAG 25.4X90 (BLADE) ×1 IMPLANT
BNDG COHESIVE 4X5 TAN STRL LF (GAUZE/BANDAGES/DRESSINGS) ×1 IMPLANT
BNDG ELASTIC 6X5.8 VLCR NS LF (GAUZE/BANDAGES/DRESSINGS) ×1 IMPLANT
BNDG GAUZE DERMACEA FLUFF 4 (GAUZE/BANDAGES/DRESSINGS) ×2 IMPLANT
DRAPE INCISE IOBAN 66X45 STRL (DRAPES) IMPLANT
ELECT CAUTERY BLADE 6.4 (BLADE) ×1 IMPLANT
ELECT REM PT RETURN 9FT ADLT (ELECTROSURGICAL) ×1
ELECTRODE REM PT RTRN 9FT ADLT (ELECTROSURGICAL) ×1 IMPLANT
GAUZE SPONGE 4X4 12PLY STRL (GAUZE/BANDAGES/DRESSINGS) IMPLANT
GAUZE XEROFORM 1X8 LF (GAUZE/BANDAGES/DRESSINGS) ×2 IMPLANT
GLOVE BIO SURGEON STRL SZ7 (GLOVE) ×2 IMPLANT
GOWN STRL REUS W/ TWL LRG LVL3 (GOWN DISPOSABLE) ×2 IMPLANT
GOWN STRL REUS W/ TWL XL LVL3 (GOWN DISPOSABLE) ×1 IMPLANT
GOWN STRL REUS W/TWL LRG LVL3 (GOWN DISPOSABLE) ×3
GOWN STRL REUS W/TWL XL LVL3 (GOWN DISPOSABLE) ×1
HANDLE YANKAUER SUCT BULB TIP (MISCELLANEOUS) ×1 IMPLANT
KIT TURNOVER KIT A (KITS) ×1 IMPLANT
LABEL OR SOLS (LABEL) ×1 IMPLANT
MANIFOLD NEPTUNE II (INSTRUMENTS) ×1 IMPLANT
MAT ABSORB  FLUID 56X50 GRAY (MISCELLANEOUS) ×1
MAT ABSORB FLUID 56X50 GRAY (MISCELLANEOUS) ×1 IMPLANT
NS IRRIG 1000ML POUR BTL (IV SOLUTION) ×1 IMPLANT
PACK EXTREMITY ARMC (MISCELLANEOUS) ×1 IMPLANT
PAD ABD DERMACEA PRESS 5X9 (GAUZE/BANDAGES/DRESSINGS) ×2 IMPLANT
PAD PREP 24X41 OB/GYN DISP (PERSONAL CARE ITEMS) ×1 IMPLANT
SPONGE T-LAP 18X18 ~~LOC~~+RFID (SPONGE) ×1 IMPLANT
STAPLER SKIN PROX 35W (STAPLE) ×1 IMPLANT
STOCKINETTE M/LG 89821 (MISCELLANEOUS) ×1 IMPLANT
SUT SILK 2 0 (SUTURE) ×1
SUT SILK 2 0 SH (SUTURE) ×2 IMPLANT
SUT SILK 2-0 18XBRD TIE 12 (SUTURE) ×1 IMPLANT
SUT SILK 3 0 (SUTURE) ×1
SUT SILK 3-0 18XBRD TIE 12 (SUTURE) ×1 IMPLANT
SUT VIC AB 0 CT1 36 (SUTURE) ×2 IMPLANT
SUT VIC AB 2-0 CT1 (SUTURE) ×2 IMPLANT
TRAP FLUID SMOKE EVACUATOR (MISCELLANEOUS) ×1 IMPLANT
WATER STERILE IRR 500ML POUR (IV SOLUTION) ×1 IMPLANT

## 2022-03-26 NOTE — TOC Progression Note (Addendum)
Transition of Care [facility]) - Progression Note    Patient Details  Name: Melanie Juarez MRN: [id number] Date of Birth: 1945-12-21  [facility] CM/SW Contact  Candie Chroman, LCSW Phone Number: 03/26/2022, 12:16 PM  Clinical Narrative:   Called daughter per RN request. Provided update. She confirmed [facility] and [facility] are top preference. Sent referral and asked [facility] admissions coordinator to review.  2:11 pm: [facility] does not have any beds at this time. [place] will review.  3:56 pm: Patient and daughter have accepted bed offer from [facility].  Expected Discharge Plan: [place] Barriers to Discharge: Continued Medical Work up  Expected Discharge Plan and [place] Choice: [place] arrangements for the past 2 months: Single Family Home                                       Social Determinants of Health (SDOH) Interventions SDOH Screenings   Food Insecurity: No Food Insecurity (03/20/2022)  Housing: Low Risk  (03/20/2022)  Transportation Needs: No Transportation Needs (03/20/2022)  Utilities: Not At Risk (03/20/2022)  Tobacco Use: Low Risk  (03/26/2022)    Readmission Risk Interventions     No data to display

## 2022-03-26 NOTE — Care Management Important Message (Signed)
Important Message  Patient Details  Name: Melanie Juarez MRN: [id number] Date of Birth: [birth year]   Medicare Important Message Given:  Yes     Dannette Barbara 03/26/2022, 2:56 PM

## 2022-03-26 NOTE — Anesthesia Preprocedure Evaluation (Signed)
Anesthesia Evaluation  Patient identified by MRN, date of birth, ID band Patient awake    Reviewed: Allergy & Precautions, NPO status , Patient's Chart, lab work & pertinent test results  History of Anesthesia Complications Negative for: history of anesthetic complications  Airway Mallampati: II  TM Distance: >3 FB Neck ROM: full    Dental  (+) Chipped, Poor Dentition, Dental Advidsory Given   Pulmonary neg pulmonary ROS   Pulmonary exam normal        Cardiovascular hypertension, On Medications + Peripheral Vascular Disease  Normal cardiovascular exam     Neuro/Psych negative neurological ROS  negative psych ROS   GI/Hepatic negative GI ROS, Neg liver ROS,,,  Endo/Other  diabetes, Poorly Controlled, Oral Hypoglycemic Agents    Renal/GU   negative genitourinary   Musculoskeletal Osteomyelitis    Abdominal   Peds  Hematology negative hematology ROS (+)   Anesthesia Other Findings Past Medical History: No date: Diabetes ([place]) No date: Hypertension  Past Surgical History: 03/21/2022: LOWER EXTREMITY ANGIOGRAPHY; Right     Comment:  Procedure: Lower Extremity Angiography;  Surgeon: Algernon Huxley, MD;  Location: [facility];  Service:               Cardiovascular;  Laterality: Right;  BMI    Body Mass Index: 30.86 kg/m      Reproductive/Obstetrics negative OB ROS                             Anesthesia Physical Anesthesia Plan  ASA: 3  Anesthesia Plan: General ETT   Post-op Pain Management: Toradol IV (intra-op)*, Ofirmev IV (intra-op)* and Dilaudid IV   Induction: Intravenous  PONV Risk Score and Plan: 3 and Ondansetron, Dexamethasone and Midazolam  Airway Management Planned: Oral ETT  Additional Equipment:   Intra-op Plan:   Post-operative Plan: Extubation in OR  Informed Consent: I have reviewed the patients History and Physical, chart, labs and  discussed the procedure including the risks, benefits and alternatives for the proposed anesthesia with the patient or authorized representative who has indicated his/her understanding and acceptance.     Dental Advisory Given  Plan Discussed with: Anesthesiologist, CRNA and Surgeon  Anesthesia Plan Comments: (Patient consented for risks of anesthesia including but not limited to:  - adverse reactions to medications - damage to eyes, teeth, lips or other oral mucosa - nerve damage due to positioning  - sore throat or hoarseness - Damage to heart, brain, nerves, lungs, other parts of body or loss of life  Patient voiced understanding.)        Anesthesia Quick Evaluation

## 2022-03-26 NOTE — Anesthesia Procedure Notes (Signed)
Procedure Name: Intubation Date/Time: 03/26/2022 7:50 AM  Performed by: Natasha Mead, CRNAPre-anesthesia Checklist: Patient identified, Emergency Drugs available, Suction available and Patient being monitored Patient Re-evaluated:Patient Re-evaluated prior to induction Oxygen Delivery Method: Circle system utilized Preoxygenation: Pre-oxygenation with 100% oxygen Induction Type: IV induction Ventilation: Mask ventilation without difficulty Laryngoscope Size: Miller and 2 Grade View: Grade II Tube type: Oral Tube size: 7.0 mm Number of attempts: 1 Airway Equipment and Method: Stylet and Oral airway Placement Confirmation: ETT inserted through vocal cords under direct vision, positive ETCO2 and breath sounds checked- equal and bilateral Secured at: 21 cm Tube secured with: Tape Dental Injury: Teeth and Oropharynx as per pre-operative assessment

## 2022-03-26 NOTE — NC FL2 (Signed)
[place] LEVEL OF CARE FORM     IDENTIFICATION  Patient Name: Melanie Juarez Birthdate: March 24, 1945 Sex: female Admission Date (Current Location): 03/19/2022  [facility] and [place] Number:  Engineering geologist and Address:  [facility], [address] [place], [place] [address]      Provider Number: [id number]  Attending Physician Name and Address:  Terrilee Croak, MD  Relative Name and Phone Number:       Current Level of Care: Hospital Recommended Level of Care: [place] Prior Approval Number:    Date Approved/Denied:   PASRR Number: [id number]  Discharge Plan: SNF    Current Diagnoses: Patient Active Problem List   Diagnosis Date Noted   Lactic acidosis 03/21/2022   Obesity (BMI 30-39.9) 03/21/2022   PAD (peripheral artery disease) ([place]) 03/21/2022   Osteomyelitis ([place]) 03/19/2022   Diabetes mellitus type 2, noninsulin dependent ([place]) 03/19/2022   Essential hypertension 03/19/2022   Leukocytosis 03/19/2022   AKI (acute kidney injury) ([place]) 03/19/2022   Hyperlipidemia 05/17/2014    Orientation RESPIRATION BLADDER Height & Weight     Self, Time, Situation, Place  Normal Continent, External catheter Weight: 212 lb 4.9 oz (96.3 kg) Height:  5' 7.5" (171.5 cm)  BEHAVIORAL SYMPTOMS/MOOD NEUROLOGICAL BOWEL NUTRITION STATUS   (None)  (None) Continent Diet (Carb modified)  AMBULATORY STATUS COMMUNICATION OF NEEDS Skin   Extensive Assist Verbally Surgical wounds (Incision on right leg: Ace, ioban, ABD)                       Personal Care Assistance Level of Assistance  Bathing, Feeding, Dressing Bathing Assistance: Maximum assistance Feeding assistance: Limited assistance Dressing Assistance: Maximum assistance     Functional Limitations Info  Sight, Hearing, Speech Sight Info: Adequate Hearing Info: Adequate Speech Info: Adequate    SPECIAL CARE FACTORS FREQUENCY  PT (By licensed PT)     PT  Frequency: 5 x week              Contractures Contractures Info: Not present    Additional Factors Info  Code Status, Allergies Code Status Info: Full code Allergies Info: Tomato           Current Medications (03/26/2022):  This is the current hospital active medication list Current Facility-Administered Medications  Medication Dose Route Frequency Provider Last Rate Last Admin   0.9 %  sodium chloride infusion   Intravenous PRN Algernon Huxley, MD 10 mL/hr at 03/26/22 0733 Restarted at 03/26/22 0910   atenolol (TENORMIN) tablet 50 mg  50 mg Oral Daily Algernon Huxley, MD   50 mg at 03/25/22 1042   atorvastatin (LIPITOR) tablet 40 mg  40 mg Oral Daily Algernon Huxley, MD   40 mg at 03/25/22 1040   ceFEPIme (MAXIPIME) 2 g in sodium chloride 0.9 % 100 mL IVPB  2 g Intravenous Q8H Algernon Huxley, MD 200 mL/hr at 03/26/22 0605 2 g at 03/26/22 0605   chlorhexidine (PERIDEX) 0.12 % solution            enoxaparin (LOVENOX) injection 40 mg  40 mg Subcutaneous Q24H Algernon Huxley, MD   40 mg at 03/25/22 2121   fentaNYL (SUBLIMAZE) 100 MCG/2ML injection            fentaNYL (SUBLIMAZE) 100 MCG/2ML injection            insulin aspart (novoLOG) injection 0-15 Units  0-15 Units Subcutaneous TID WC [name]  S, MD   2 Units at 03/25/22 1245   insulin aspart (novoLOG) injection 0-5 Units  0-5 Units Subcutaneous QHS Algernon Huxley, MD       insulin glargine-yfgn [facility]) injection 20 Units  20 Units Subcutaneous Daily Algernon Huxley, MD   20 Units at 03/26/22 1042   metroNIDAZOLE (FLAGYL) IVPB 500 mg  500 mg Intravenous Q12H Algernon Huxley, MD   Paused at 03/26/22 0437   morphine (PF) 2 MG/ML injection 2 mg  2 mg Intravenous Q3H PRN Algernon Huxley, MD       ondansetron (ZOFRAN) 4 MG/2ML injection            ondansetron (ZOFRAN) injection 4 mg  4 mg Intravenous Q6H PRN Algernon Huxley, MD       ondansetron [facility]) injection 4 mg  4 mg Intravenous Q6H PRN Algernon Huxley, MD   4 mg at 03/26/22 1050   oxyCODONE (Oxy  IR/ROXICODONE) 5 MG immediate release tablet            promethazine (PHENERGAN) 12.5 mg in sodium chloride 0.9 % 50 mL IVPB  12.5 mg Intravenous Q6H PRN Algernon Huxley, MD       protein supplement (ENSURE MAX) liquid  11 oz Oral BID Algernon Huxley, MD   11 oz at 03/25/22 1042   vancomycin (VANCOCIN) IVPB 1000 mg/200 mL premix  1,000 mg Intravenous Q24H Algernon Huxley, MD   Stopped at 03/25/22 1921     Discharge Medications: Please see discharge summary for a list of discharge medications.  Relevant Imaging Results:  Relevant Lab Results:   Additional Information SS#: [id number]  Candie Chroman, LCSW

## 2022-03-26 NOTE — Op Note (Signed)
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 female who presents with right leg osteomyelitis and abscess status post open guillotine amputation just above the ankle last week.  The patient is scheduled for a right below-the-knee amputation if the tissue appears viable and above-knee amputation if there is still active infection.  I discussed in depth with the patient the risks, benefits, and alternatives to this procedure.  The patient is aware that the risk of this operation included but are not limited to:  bleeding, infection, myocardial infarction, stroke, death, failure to heal amputation wound, and possible need for more proximal amputation.  The patient is aware of the risks and agrees proceed forward with the procedure. An assistant was present during the procedure to help facilitate the exposure and expedite the procedure.   DESCRIPTION:  After full informed written consent was obtained from the patient, the patient was brought back to the operating room, and placed supine upon the operating table.  Prior to induction, the patient received IV antibiotics. The assistant provided retraction and mobilization to help facilitate exposure and expedite the procedure throughout the entire procedure.  This included following suture, using retractors, and optimizing lighting.  The patient was then prepped and draped in the standard fashion for a below-the-knee amputation.  Previous dressing was removed and all the tissue appeared reasonably  healthy and viable and there was no obvious active infection so a below-knee amputation would be performed.  After obtaining adequate anesthesia, the patient was prepped and draped in the standard fashion for a right below-the-knee amputation.  I marked out the anterior incision two finger breadths below the tibial tuberosity and then the marked out a posterior flap that was one third of the circumference of the calf in length ans was a couple cm above the previous circular incision for the open amputation from last week.   I made the incisions for these flaps, and then dissected through the subcutaneous tissue, fascia, and muscle anteriorly.  I elevated  the periosteal tissue superiorly so that the tibia was about 3-4 cm shorter than the anterior skin flap.  I then transected the tibia with a power saw and then took a wedge off the tibia anteriorly with the power saw.  Then I smoothed out the rough edges.  In a similar fashion, I cut back the fibula about two centimeters higher than the level of the tibia with a bone cutter.  I put a bone hook into the distal tibia and then used a large amputation knife to sharply develop a tissue plane through the muscle along the fibula.  In such fashion, the posterior flap was developed.  At this point, the specimen was passed off the field as the below-the-knee amputation.  At this point, I clamped all visibly bleeding arteries and veins using a combination of suture ligation with Silk suture and electrocautery.  Bleeding continued to be controlled with electrocautery and suture ligature.  The stump was washed off with sterile normal saline and no further active bleeding was noted.  I reapproximated the anterior and posterior fascia  with interrupted stitches of 0 Vicryl.  This was completed along the entire length of anterior and posterior fascia until there were no more loose space in the fascial line. I then placed a layer of 2-0 Vicryl sutures in the subcutaneous tissue.  The skin was then  reapproximated with staples.  The stump was washed off and dried.  The incision was dressed with Xeroform and  then fluffs were applied.  Kerlix was wrapped around the leg and then gently an ACE wrap was applied.    COMPLICATIONS: none  CONDITION: stable   [name]  03/26/2022, 8:49 AM    This note was created with Dragon Medical transcription system. Any errors in dictation are purely unintentional.

## 2022-03-26 NOTE — Inpatient Diabetes Management (Signed)
Inpatient Diabetes Program Recommendations  AACE/ADA: New Consensus Statement on Inpatient Glycemic Control (2015)  Target Ranges:  Prepandial:   less than 140 mg/dL      Peak postprandial:   less than 180 mg/dL (1-2 hours)      Critically ill patients:  140 - 180 mg/dL    Latest Reference Range & Units 03/20/22 10:13  Hemoglobin A1C 4.8 - 5.6 % 11.9 (H)  294 mg/dk  (H): Data is abnormally high  Latest Reference Range & Units 03/26/22 07:26 03/26/22 09:09 03/26/22 10:11 03/26/22 11:55  Glucose-Capillary 70 - 99 mg/dL 107 (H) 111 (H) 160 (H) 203 (H)  (H): Data is abnormally high  Admit with Right foot and right ankle osteomyelitis    Home DM Meds: Glipizide 5 mg BID      Metformin 1000 mg BID   Current Orders: Semglee 5 units Daily      Novolog Moderate Correction Scale/ SSI (0-15 units) TID AC + HS      Metformin 1000 mg BID      Glipizide 5 mg BID     Back to the OR today  Now with BKA with Wound Vac placement  Met w/ pt at bedside this afternoon--pt was sleepy.  Gave me permission to call her Dtr to give update on A1c and CBGs.  Per notes, looks like pt will need SNF for Rehab after d/c from hospital.    --Will follow patient during hospitalization--  Wyn Quaker RN, MSN, [place] Diabetes Coordinator Inpatient Glycemic Control Team Team Pager: [phone] (8a-5p)

## 2022-03-26 NOTE — Anesthesia Postprocedure Evaluation (Signed)
Anesthesia Post Note  Patient: Melanie Juarez  Procedure(s) Performed: BKA REVISION (Right: Knee)  Patient location during evaluation: PACU Anesthesia Type: General Level of consciousness: awake and alert Pain management: pain level controlled Vital Signs Assessment: post-procedure vital signs reviewed and stable Respiratory status: spontaneous breathing, nonlabored ventilation, respiratory function stable and patient connected to nasal cannula oxygen Cardiovascular status: blood pressure returned to baseline and stable Postop Assessment: no apparent nausea or vomiting Anesthetic complications: no  No notable events documented.   Last Vitals:  Vitals:   03/26/22 1014 03/26/22 1047  BP: (!) 138/48 (!) 124/45  Pulse: 75 73  Resp: 16 18  Temp: 36.4 C 36.6 C  SpO2: 100% 99%    Last Pain:  Vitals:   03/26/22 1047  TempSrc: Oral  PainSc:                  [name]

## 2022-03-26 NOTE — Progress Notes (Signed)
PROGRESS NOTE  [name]  DOB: Aug 22, 1945  PCP: System, Provider Not In [id number]  DOA: 03/19/2022  LOS: 7 days  Hospital Day: 8  Brief narrative: Melanie Juarez is a 77 y.o. 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 2/5, patient was directed to ED from podiatry office with concern of right ankle/foot osteomyelitis MRI right foot on admission confirmed with suspicion. Blood culture obtained Started on broad-spectrum IV antibiotics Admitted to [place] and vascular surgery consulted. 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    Blood cultures NGTD.   Remains on broadspectrum antibiotics.   Plan for return to the OR for skin closure on Monday.  Subjective: Patient was seen and examined this morning.  Underwent right BKA today by vascular surgery.  Daughter was at bedside.  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 Currently on IV cefepime, IV Flagyl and IV vancomycin.  Blood culture no growth till date.  WBC count improving.  Lactic acid level improved. Pain control with oral and IV opioids as needed Recent Labs  Lab 03/19/22 1246 03/19/22 1811 03/19/22 2201 03/20/22 1013 03/21/22 0539 03/22/22 0459 03/23/22 0318 03/24/22 0532 03/25/22 0535  WBC 19.2*  --   --  14.3* 13.8* 14.1* 14.7* 13.6* 12.0*  LATICACIDVEN 1.4 2.6* 1.3 0.8  --   --   --   --   --    Type 2 diabetes mellitus uncontrolled with hyperglycemia A1c elevated to 11.9 on 03/20/2022 PTA on metformin 1000 mg twice daily, glipizide 5 mg twice daily.  Currently on hold. Currently patient is on Semglee 20 units daily and sliding scale insulin with Accu-Cheks.  She will level up this morning after the surgery is expected. Renal  function is normal.  I will resume glipizide and metformin from tomorrow and reduce Semglee to 5 units daily. Recent Labs  Lab 03/25/22 2042 03/26/22 0726 03/26/22 0909 03/26/22 1011 03/26/22 1155  GLUCAP 134* 107* 111* 160* 203*   Essential hypertension PTA on atenolol 50 mg daily, chlorthalidone 25 mg daily Continue atenolol.  Chlorthalidone on hold. IV hydralazine as needed   PAD s/p revascularization as above Hyperlipidemia Continue Lipitor 40 mg daily  Hypomagnesemia/hypophosphatemia Electrolyte levels improved with replacement. Recent Labs  Lab 03/20/22 1013 03/21/22 0539 03/22/22 0459 03/24/22 0532 03/25/22 0535  K 3.8 3.5 3.6 3.5 4.0  MG  --   --  1.2* 1.8  --   PHOS  --   --  2.5 1.8*  --    AKI Creatinine elevated to 1.36 on admission.  Improved with hydration. Recent Labs    03/19/22 1246 03/20/22 1013 03/21/22 0539 03/22/22 0459 03/24/22 0532 03/25/22 0535 03/26/22 0622  BUN 28* 21 15 16 15 16  $ --   CREATININE 1.36* 0.91 0.84 0.76 0.77 0.87 0.85   Acute on chronic anemia Hemoglobin more than 10 at baseline.  Currently running low perioperatively.  Stable yesterday..  Repeat labs tomorrow Recent Labs    03/21/22 0539 03/22/22 0459 03/23/22 0318 03/24/22 0532 03/25/22 0535  HGB 8.1* 8.1* 8.1* 7.5* 8.1*  MCV 82.2 82.1 82.6 83.4 83.3    Obesity  Body mass index is 32.76 kg/m. Patient has been advised to make an attempt to improve diet and exercise patterns to aid in  weight loss.   Mobility: PT eval obtained.  SNF recommended  Goals of care   Code Status: Full Code     DVT prophylaxis:  enoxaparin (LOVENOX) injection 40 mg Start: 03/24/22 2200 Place TED hose Start: 03/19/22 1447   Antimicrobials: IV antibiotics Fluid: None currently Consultants: Vascular surgery Family Communication: Daughter at bedside  Status is: Inpatient Level of care: Med-Surg   Dispo: Patient is from: Home              Anticipated d/c is to: Pending  clinical course Continue inhospital care because: Underwent BKA today.  Pending PT eval.  SNF recommended   Scheduled Meds:  atenolol  50 mg Oral Daily   atorvastatin  40 mg Oral Daily   chlorhexidine       enoxaparin (LOVENOX) injection  40 mg Subcutaneous Q24H   fentaNYL       fentaNYL       [START ON 03/27/2022] glipiZIDE  5 mg Oral BID AC   insulin aspart  0-15 Units Subcutaneous TID WC   insulin aspart  0-5 Units Subcutaneous QHS   [START ON 03/27/2022] insulin glargine-yfgn  5 Units Subcutaneous Daily   [START ON 03/27/2022] metFORMIN  1,000 mg Oral BID   ondansetron       oxyCODONE       Ensure Max Protein  11 oz Oral BID    PRN meds: sodium chloride, chlorhexidine, fentaNYL, fentaNYL, morphine injection, ondansetron, ondansetron (ZOFRAN) IV, ondansetron (ZOFRAN) IV, oxyCODONE, promethazine (PHENERGAN) injection (IM or IVPB)   Infusions:   sodium chloride 10 mL/hr at 03/26/22 0733   ceFEPime (MAXIPIME) IV 2 g (03/26/22 [id number])   metronidazole Stopped (03/26/22 0437)   promethazine (PHENERGAN) injection (IM or IVPB)     vancomycin Stopped (03/25/22 1921)    Diet:  Diet Order             Diet Carb Modified Fluid consistency: Thin; Room service appropriate? Yes  Diet effective now                   Antimicrobials: Anti-infectives (From admission, onward)    Start     Dose/Rate Route Frequency Ordered Stop   03/25/22 1700  vancomycin (VANCOCIN) IVPB 1000 mg/200 mL premix        1,000 mg 200 mL/hr over 60 Minutes Intravenous Every 24 hours 03/25/22 1639     03/22/22 1555  vancomycin (VANCOCIN) powder  Status:  Discontinued          As needed 03/22/22 1606 03/22/22 1619   03/21/22 1800  vancomycin (VANCOREADY) IVPB 1250 mg/250 mL  Status:  Discontinued        1,250 mg 166.7 mL/hr over 90 Minutes Intravenous Every 24 hours 03/21/22 1350 03/25/22 1635   03/21/22 1500  ceFEPIme (MAXIPIME) 2 g in sodium chloride 0.9 % 100 mL IVPB        2 g 200 mL/hr over 30 Minutes  Intravenous Every 8 hours 03/21/22 1350     03/21/22 0727  ceFAZolin (ANCEF) 2-4 GM/100ML-% IVPB       Note to Pharmacy: Maynor, Erin A: cabinet override      03/21/22 0727 03/21/22 1944   03/21/22 0257  ceFAZolin (ANCEF) IVPB 2g/100 mL premix        2 g 200 mL/hr over 30 Minutes Intravenous 30 min pre-op 03/21/22 0257 03/22/22 0731   03/20/22 1800  vancomycin (VANCOCIN) IVPB 1000 mg/200 mL premix  Status:  Discontinued  1,000 mg 200 mL/hr over 60 Minutes Intravenous Every 24 hours 03/19/22 1644 03/21/22 1350   03/19/22 1600  ceFEPIme (MAXIPIME) 2 g in sodium chloride 0.9 % 100 mL IVPB  Status:  Discontinued        2 g 200 mL/hr over 30 Minutes Intravenous Every 12 hours 03/19/22 1500 03/21/22 1350   03/19/22 1445  vancomycin (VANCOREADY) IVPB 2000 mg/400 mL        2,000 mg 200 mL/hr over 120 Minutes Intravenous  Once 03/19/22 1439 03/19/22 2155   03/19/22 1430  cefTRIAXone (ROCEPHIN) 2 g in sodium chloride 0.9 % 100 mL IVPB  Status:  Discontinued        2 g 200 mL/hr over 30 Minutes Intravenous Every 24 hours 03/19/22 1426 03/19/22 1450   03/19/22 1430  metroNIDAZOLE (FLAGYL) IVPB 500 mg        500 mg 100 mL/hr over 60 Minutes Intravenous Every 12 hours 03/19/22 1426 03/26/22 1429       Skin assessment:       Nutritional status:  Body mass index is 32.76 kg/m.          Objective: Vitals:   03/26/22 1014 03/26/22 1047  BP: (!) 138/48 (!) 124/45  Pulse: 75 73  Resp: 16 18  Temp: 97.6 F (36.4 C) 97.8 F (36.6 C)  SpO2: 100% 99%    Intake/Output Summary (Last 24 hours) at 03/26/2022 1244 Last data filed at 03/26/2022 0910 Gross per 24 hour  Intake 1132.73 ml  Output 850 ml  Net 282.73 ml   Filed Weights   03/22/22 1209 03/24/22 0415 03/26/22 0703  Weight: 90.7 kg 96.3 kg 96.3 kg   Weight change:  Body mass index is 32.76 kg/m.   Physical Exam: General exam: Pleasant, elderly African-American female.  Not in distress Skin: No rashes, lesions or  ulcers. HEENT: Atraumatic, normocephalic, no obvious bleeding Lungs: Clear to auscultation bilaterally CVS: Regular rate and rhythm, no murmur GI/Abd soft nontender, nondistended, bowel sound present CNS: Alert from the effect of anesthesia. Psychiatry: Mood appropriate Extremities: Right BKA status.    Data Review: I have personally reviewed the laboratory data and studies available.  F/u labs ordered Unresulted Labs (From admission, onward)     Start     Ordered   03/27/22 0500  CBC  Tomorrow morning,   R       Question:  Specimen collection method  Answer:  Lab=Lab collect   03/26/22 1015   03/27/22 0500  Creatinine, serum  Tomorrow morning,   R       Question:  Specimen collection method  Answer:  Lab=Lab collect   03/26/22 1236            Total time spent in review of labs and imaging, patient evaluation, formulation of plan, documentation and communication with family: 71 minutes  Signed, Terrilee Croak, MD [facility] 03/26/2022

## 2022-03-26 NOTE — Transfer of Care (Signed)
Immediate Anesthesia Transfer of Care Note  Patient: KEIYANA STUFFLEBEAM  Procedure(s) Performed: BKA REVISION (Right: Knee)  Patient Location: PACU  Anesthesia Type:General  Level of Consciousness: awake, alert , and oriented  Airway & Oxygen Therapy: Patient Spontanous Breathing and Patient connected to face mask oxygen  Post-op Assessment: Report given to RN and Post -op Vital signs reviewed and stable  Post vital signs: stable  Last Vitals:  Vitals Value Taken Time  BP 122/41 03/26/22 0906  Temp 36.6 C 03/26/22 0906  Pulse 83 03/26/22 0910  Resp 16 03/26/22 0910  SpO2 100 % 03/26/22 0910  Vitals shown include unvalidated device data.  Last Pain:  Vitals:   03/26/22 0703  TempSrc:   PainSc: 0-No pain      Patients Stated Pain Goal: 0 ([id number])  Complications: No notable events documented.

## 2022-03-26 NOTE — Interval H&P Note (Signed)
History and Physical Interval Note:  03/26/2022 7:13 AM  Melanie Juarez  has presented today for surgery, with the diagnosis of [place].  The various methods of treatment have been discussed with the patient and family. After consideration of risks, benefits and other options for treatment, the patient has consented to  Procedure(s): AMPUTATION CLOSURE (Right) as a surgical intervention.  The patient's history has been reviewed, patient examined, no change in status, stable for surgery.  I have reviewed the patient's chart and labs.  Questions were answered to the patient's satisfaction.     [name]

## 2022-03-27 LAB — CBC
HCT: 20.5 % — ABNORMAL LOW (ref 36.0–46.0)
Hemoglobin: 6.6 g/dL — ABNORMAL LOW (ref 12.0–15.0)
MCH: 27.4 pg (ref 26.0–34.0)
MCHC: 32.2 g/dL (ref 30.0–36.0)
MCV: 85.1 fL (ref 80.0–100.0)
Platelets: 303 10*3/uL (ref 150–400)
RBC: 2.41 MIL/uL — ABNORMAL LOW (ref 3.87–5.11)
RDW: 17.8 % — ABNORMAL HIGH (ref 11.5–15.5)
WBC: 19.7 10*3/uL — ABNORMAL HIGH (ref 4.0–10.5)
nRBC: 0.5 % — ABNORMAL HIGH (ref 0.0–0.2)

## 2022-03-27 LAB — SURGICAL PATHOLOGY

## 2022-03-27 LAB — HEMOGLOBIN AND HEMATOCRIT, BLOOD
HCT: 23.5 % — ABNORMAL LOW (ref 36.0–46.0)
Hemoglobin: 7.7 g/dL — ABNORMAL LOW (ref 12.0–15.0)

## 2022-03-27 LAB — GLUCOSE, CAPILLARY
Glucose-Capillary: 99 mg/dL (ref 70–99)
Glucose-Capillary: 150 mg/dL — ABNORMAL HIGH (ref 70–99)
Glucose-Capillary: 99 mg/dL (ref 70–99)
Glucose-Capillary: 86 mg/dL (ref 70–99)

## 2022-03-27 LAB — CREATININE, SERUM
Creatinine, Ser: 0.86 mg/dL (ref 0.44–1.00)
GFR, Estimated: >60 mL/min (ref >60)

## 2022-03-27 LAB — PREPARE RBC (CROSSMATCH)

## 2022-03-27 MED ORDER — OXYCODONE-ACETAMINOPHEN 5-325 MG PO TABS
1.0000 | ORAL_TABLET | ORAL | Status: DC | PRN
  Administered 2022-03-27: 1 via ORAL
  Filled 2022-03-27: qty 1

## 2022-03-27 MED ORDER — POLYETHYLENE GLYCOL 3350 17 G PO PACK
17.0000 g | PACK | Freq: Every day | ORAL | Status: DC | PRN
  Administered 2022-03-27: 17 g via ORAL
  Filled 2022-03-27: qty 1

## 2022-03-27 MED ORDER — SENNOSIDES-DOCUSATE SODIUM 8.6-50 MG PO TABS
1.0000 | ORAL_TABLET | Freq: Every day | ORAL | Status: DC
  Administered 2022-03-27: 1 via ORAL
  Filled 2022-03-27: qty 1

## 2022-03-27 MED ORDER — SODIUM CHLORIDE 0.9% IV SOLUTION: Freq: Once | INTRAVENOUS | Status: AC

## 2022-03-27 MED ORDER — BISACODYL 5 MG PO TBEC
10.0000 mg | DELAYED_RELEASE_TABLET | Freq: Once | ORAL | Status: AC
  Administered 2022-03-27: 10 mg via ORAL
  Filled 2022-03-27: qty 2

## 2022-03-27 NOTE — Progress Notes (Addendum)
PROGRESS NOTE  Melanie Juarez  DOB: 1945-09-03  PCP: System, Provider Not In [id number]  DOA: 03/19/2022  LOS: 8 days  Hospital Day: 9  Brief narrative: Melanie Juarez is a 77 y.o. 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 2/5, patient was directed to ED from podiatry office with concern of right ankle/foot osteomyelitis MRI right foot on admission confirmed with suspicion. Blood culture obtained Started on broad-spectrum IV antibiotics Admitted to [facility] and vascular surgery consulted. 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   2/12, underwent revision of right BKA and wound closure.  Blood cultures NGTD.   Remains on broadspectrum antibiotics.   Plan for return to the OR for skin closure on Monday.  Subjective: Patient was seen and examined this morning.  Lying on bed.  Not in distress.  No new symptoms.  No family at bedside. Underwent right BKA yesterday by vascular surgery.  Labs this morning showed hemoglobin low at 6.6.  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 WBC count improving.  Lactic acid level improved. Pain control with oral and IV opioids as needed Currently on IV cefepime, IV Flagyl and IV vancomycin.  Blood culture no growth till date.  Discussed with vascular surgery.  No need of antibiotics at discharge. Recent Labs  Lab 03/22/22 0459 03/23/22 0318 03/24/22 0532 03/25/22 0535 03/27/22 0404  WBC 14.1* 14.7* 13.6* 12.0* 19.7*    Type 2 diabetes mellitus uncontrolled with hyperglycemia A1c elevated to 11.9 on 03/20/2022 PTA on metformin 1000 mg twice daily, glipizide 5 mg twice daily.  Currently on hold. Resumed metformin glipizide this morning.  Blood sugars running low  at 99.  Okay to hold Semglee this morning.  Continue sliding scale insulin with Accu-Cheks. Recent Labs  Lab 03/26/22 1011 03/26/22 1155 03/26/22 1649 03/26/22 2027 03/27/22 0758  GLUCAP 160* 203* 187* 209* 99    Essential hypertension PTA on atenolol 50 mg daily, chlorthalidone 25 mg daily Currently blood pressure is controlled with atenolol only. Chlorthalidone on hold. IV hydralazine as needed   PAD s/p revascularization as above Hyperlipidemia Continue Lipitor 40 mg daily  Hypomagnesemia/hypophosphatemia Electrolyte levels improved with replacement. Recent Labs  Lab 03/21/22 0539 03/22/22 0459 03/24/22 0532 03/25/22 0535  K 3.5 3.6 3.5 4.0  MG  --  1.2* 1.8  --   PHOS  --  2.5 1.8*  --     AKI Creatinine elevated to 1.36 on admission.  Improved with hydration. Recent Labs    03/19/22 1246 03/20/22 1013 03/21/22 0539 03/22/22 0459 03/24/22 0532 03/25/22 0535 03/26/22 0622 03/27/22 0404  BUN 28* 21 15 16 15 16  $ --   --   CREATININE 1.36* 0.91 0.84 0.76 0.77 0.87 0.85 0.86    Acute on chronic anemia Hemoglobin more than 10 at baseline.  Hemoglobin low at 6.6 this morning, probably because of surgical blood loss.  1 unit PRBC transfused.  Repeat hemoglobin tomorrow. Recent Labs    03/22/22 0459 03/23/22 0318 03/24/22 0532 03/25/22 0535 03/27/22 0404  HGB 8.1* 8.1* 7.5* 8.1* 6.6*  MCV 82.1 82.6 83.4 83.3 85.1     Obesity  Body mass index is 32.76 kg/m. Patient has been advised to make an attempt to improve diet and exercise patterns to aid in  weight loss.   Mobility: PT eval obtained.  SNF recommended  Goals of care   Code Status: Full Code     DVT prophylaxis:  enoxaparin (LOVENOX) injection 40 mg Start: 03/24/22 2200 Place TED hose Start: 03/19/22 1447   Antimicrobials: IV antibiotics Fluid: None currently Consultants: Vascular surgery Family Communication: Daughter at bedside  Status is: Inpatient Level of care: Med-Surg    Dispo: Patient is from: Home              Anticipated d/c is to: Pending clinical course Continue inhospital care because: Plan to discharge tomorrow if hemoglobin stable.   Scheduled Meds:  atenolol  50 mg Oral Daily   atorvastatin  40 mg Oral Daily   enoxaparin (LOVENOX) injection  40 mg Subcutaneous Q24H   glipiZIDE  5 mg Oral BID AC   insulin aspart  0-15 Units Subcutaneous TID WC   insulin aspart  0-5 Units Subcutaneous QHS   insulin glargine-yfgn  5 Units Subcutaneous Daily   metFORMIN  1,000 mg Oral BID WC   Ensure Max Protein  11 oz Oral BID    PRN meds: sodium chloride, ondansetron (ZOFRAN) IV, ondansetron (ZOFRAN) IV, oxyCODONE-acetaminophen, promethazine (PHENERGAN) injection (IM or IVPB)   Infusions:   sodium chloride 10 mL/hr at 03/26/22 0733   ceFEPime (MAXIPIME) IV 2 g (03/27/22 0724)   promethazine (PHENERGAN) injection (IM or IVPB)     vancomycin 1,000 mg (03/26/22 1659)    Diet:  Diet Order             Diet Carb Modified Fluid consistency: Thin; Room service appropriate? Yes  Diet effective now                   Antimicrobials: Anti-infectives (From admission, onward)    Start     Dose/Rate Route Frequency Ordered Stop   03/25/22 1700  vancomycin (VANCOCIN) IVPB 1000 mg/200 mL premix        1,000 mg 200 mL/hr over 60 Minutes Intravenous Every 24 hours 03/25/22 1639     03/22/22 1555  vancomycin (VANCOCIN) powder  Status:  Discontinued          As needed 03/22/22 1606 03/22/22 1619   03/21/22 1800  vancomycin (VANCOREADY) IVPB 1250 mg/250 mL  Status:  Discontinued        1,250 mg 166.7 mL/hr over 90 Minutes Intravenous Every 24 hours 03/21/22 1350 03/25/22 1635   03/21/22 1500  ceFEPIme (MAXIPIME) 2 g in sodium chloride 0.9 % 100 mL IVPB        2 g 200 mL/hr over 30 Minutes Intravenous Every 8 hours 03/21/22 1350     03/21/22 0727  ceFAZolin (ANCEF) 2-4 GM/100ML-% IVPB       Note to Pharmacy: Maynor, Erin A: cabinet override      03/21/22  0727 03/21/22 1944   03/21/22 0257  ceFAZolin (ANCEF) IVPB 2g/100 mL premix        2 g 200 mL/hr over 30 Minutes Intravenous 30 min pre-op 03/21/22 0257 03/22/22 0731   03/20/22 1800  vancomycin (VANCOCIN) IVPB 1000 mg/200 mL premix  Status:  Discontinued        1,000 mg 200 mL/hr over 60 Minutes Intravenous Every 24 hours 03/19/22 1644 03/21/22 1350   03/19/22 1600  ceFEPIme (MAXIPIME) 2 g in sodium chloride 0.9 % 100 mL IVPB  Status:  Discontinued        2 g 200 mL/hr over 30 Minutes Intravenous Every 12 hours 03/19/22 1500  03/21/22 1350   03/19/22 1445  vancomycin (VANCOREADY) IVPB 2000 mg/400 mL        2,000 mg 200 mL/hr over 120 Minutes Intravenous  Once 03/19/22 1439 03/19/22 2155   03/19/22 1430  cefTRIAXone (ROCEPHIN) 2 g in sodium chloride 0.9 % 100 mL IVPB  Status:  Discontinued        2 g 200 mL/hr over 30 Minutes Intravenous Every 24 hours 03/19/22 1426 03/19/22 1450   03/19/22 1430  metroNIDAZOLE (FLAGYL) IVPB 500 mg        500 mg 100 mL/hr over 60 Minutes Intravenous Every 12 hours 03/19/22 1426 03/26/22 1429       Skin assessment:       Nutritional status:  Body mass index is 32.76 kg/m.          Objective: Vitals:   03/27/22 1014 03/27/22 1050  BP: (!) 117/47 (!) 120/45  Pulse: 88 89  Resp: 17 18  Temp: 98.2 F (36.8 C) 98.3 F (36.8 C)  SpO2: 100% 100%    Intake/Output Summary (Last 24 hours) at 03/27/2022 1131 Last data filed at 03/27/2022 0440 Gross per 24 hour  Intake 240 ml  Output 300 ml  Net -60 ml    Filed Weights   03/22/22 1209 03/24/22 0415 03/26/22 0703  Weight: 90.7 kg 96.3 kg 96.3 kg   Weight change:  Body mass index is 32.76 kg/m.   Physical Exam: General exam: Pleasant, elderly African-American female.  No new symptoms. Skin: No rashes, lesions or ulcers. HEENT: Atraumatic, normocephalic, no obvious bleeding Lungs: Clear to auscultation bilaterally CVS: Regular rate and rhythm, no murmur GI/Abd soft nontender,  nondistended, bowel sound present CNS: Alert from the effect of anesthesia. Psychiatry: Mood appropriate Extremities: Right BKA status.    Data Review: I have personally reviewed the laboratory data and studies available.  F/u labs ordered Unresulted Labs (From admission, onward)     Start     Ordered   03/28/22 [id number]  Basic metabolic panel  Tomorrow morning,   R       Question:  Specimen collection method  Answer:  Lab=Lab collect   03/27/22 0815   03/28/22 0500  CBC with Differential/Platelet  Tomorrow morning,   R       Question:  Specimen collection method  Answer:  Lab=Lab collect   03/27/22 0815            Total time spent in review of labs and imaging, patient evaluation, formulation of plan, documentation and communication with family: 19 minutes  Signed, Terrilee Croak, MD [facility] 03/27/2022

## 2022-03-27 NOTE — Progress Notes (Signed)
  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  She has no complaints overnight.  Vitals are remained stable.  Recovering as expected.   Vitals:   03/27/22 1014 03/27/22 1050  BP: (!) 117/47 (!) 120/45  Pulse: 88 89  Resp: 17 18  Temp: 98.2 F (36.8 C) 98.3 F (36.8 C)  SpO2: 100% 100%   Physical Exam: Cardiac:  RRR Lungs:  Lungs clear to auscultation  Incisions: Right BKA incision with staples covered by dressings clean dry and intact Extremities: Right BKA healing as expected Abdomen: Positive bowel sounds, soft, nontender, nondistended, Neurologic: Alert and oriented x 3, neuro intact, answers all questions appropriately  CBC    Component Value Date/Time   WBC 19.7 (H) 03/27/2022 0404   RBC 2.41 (L) 03/27/2022 0404   HGB 6.6 (L) 03/27/2022 0404   HCT 20.5 (L) 03/27/2022 0404   PLT 303 03/27/2022 0404   MCV 85.1 03/27/2022 0404   MCH 27.4 03/27/2022 0404   MCHC 32.2 03/27/2022 0404   RDW 17.8 (H) 03/27/2022 0404   LYMPHSABS 3.2 03/25/2022 0535   MONOABS 1.2 (H) 03/25/2022 0535   EOSABS 0.4 03/25/2022 0535   BASOSABS 0.1 03/25/2022 0535    BMET    Component Value Date/Time   NA 134 (L) 03/25/2022 0535   K 4.0 03/25/2022 0535   CL 105 03/25/2022 0535   CO2 24 03/25/2022 0535   GLUCOSE 148 (H) 03/25/2022 0535   BUN 16 03/25/2022 0535   CREATININE 0.86 03/27/2022 0404   CALCIUM 7.8 (L) 03/25/2022 0535   GFRNONAA >60 03/27/2022 0404    INR No results found for: "INR"   Intake/Output Summary (Last 24 hours) at 03/27/2022 1106 Last data filed at 03/27/2022 0440 Gross per 24 hour  Intake 240 ml  Output 300 ml  Net -60 ml     Assessment/Plan:  77 y.o. 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  1 Day Post-Op   Plan: Patient is recovering as  expected. Dressing to right BKA [place] covering clean dry and intact Continue good pain control Work with PT and OT. Plan is rehab in the next couple of days.  DVT prophylaxis: Lovenox subcu 40 mg every 24 hours   Drema Pry [facility] 03/27/2022 11:06 AM

## 2022-03-27 NOTE — TOC Progression Note (Signed)
Transition of Care [facility]) - Progression Note    Patient Details  Name: Melanie Juarez MRN: [id number] Date of Birth: 02-Aug-1945  Transition of Care [facility]) CM/SW [place], LCSW Phone Number: 03/27/2022, 1:06 PM  Clinical Narrative:  Per MD, potential discharge tomorrow if hemoglobin is stable. Lyliana Dicenso admissions coordinator is aware.   Expected Discharge Plan: [place] Barriers to Discharge: Continued Medical Work up  Expected Discharge Plan and [place] Choice: [place] arrangements for the past 2 months: Single Family Home                                       Social Determinants of Health (SDOH) Interventions SDOH Screenings   Food Insecurity: No Food Insecurity (03/20/2022)  Housing: Low Risk  (03/20/2022)  Transportation Needs: No Transportation Needs (03/20/2022)  Utilities: Not At Risk (03/20/2022)  Tobacco Use: Low Risk  (03/26/2022)    Readmission Risk Interventions     No data to display

## 2022-03-27 NOTE — Progress Notes (Signed)
Physical Therapy Treatment Patient Details Name: Melanie Juarez MRN: [id number] DOB: 03-16-1945 Today's Date: 03/27/2022   History of Present Illness Pt is a 77 y.o. 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  MD assessement also includes AKI, hypomagnesemia/hypophosphatemia, and acute on chronic anemia. S/p revision of BKA     PT Comments    Patient seen today after BKA revision performed yesterday. Current care plan is appropriate and discharge recommendation for SNF is appropriate. The patient was able to sit on the side of the bed for several minutes with good sitting balance. No reported increased pain with mobility. She does report dizziness and fatigue. Mod A for lateral scooting along edge of bed and patient declined getting up to chair today. Recommend to continue PT to maximize independence and decrease caregiver burden.    Recommendations for follow up therapy are one component of a multi-disciplinary discharge planning process, led by the attending physician.  Recommendations may be updated based on patient status, additional functional criteria and insurance authorization.  Follow Up Recommendations  Skilled nursing-short term rehab (<3 hours/day) Can patient physically be transported by private vehicle: No   Assistance Recommended at Discharge Frequent or constant Supervision/Assistance  Patient can return home with the following Assistance with cooking/housework;Direct supervision/assist for medications management;Assist for transportation;Help with stairs or ramp for entrance;A little help with walking and/or transfers;A little help with bathing/dressing/bathroom   Equipment Recommendations   (to be  determined at next level of care)    Recommendations for Other Services       Precautions / Restrictions Precautions Precautions: Fall Restrictions Weight Bearing Restrictions: Yes RLE Weight Bearing: Non weight bearing     Mobility  Bed Mobility Overal bed mobility: Needs Assistance Bed Mobility: Supine to Sit, Sit to Supine     Supine to sit: Min guard Sit to supine: Min assist   General bed mobility comments: increased time required with lifting assistance needed. cues for task initiation and sequencing    Transfers                  Lateral/Scoot Transfers: Mod assist General transfer comment: patient performed lateral scoot transfer to the right while seated on bed with moderate assistance and cues for technique. she declined getting up to chair due to fatigue and dizziness    Ambulation/Gait               General Gait Details: not attempted   Stairs             Wheelchair Mobility    Modified Rankin (Stroke Patients Only)       Balance Overall balance assessment: Needs assistance Sitting-balance support: Feet supported Sitting balance-Leahy Scale: Good Sitting balance - Comments: no loss of balance but dizzines reported with activity                                    Cognition Arousal/Alertness: Awake/alert Behavior During Therapy: WFL for tasks assessed/performed Overall Cognitive Status: Within Functional Limits for tasks assessed  Exercises Total Joint Exercises Straight Leg Raises: AAROM, Strengthening, Right, 5 reps, Supine    General Comments General comments (skin integrity, edema, etc.): BKA revision performed yesterday with new orders recieved. current care plan is appropriate. continue to recommend SNF placement      Pertinent Vitals/Pain Pain Assessment Pain Assessment: Faces Faces Pain Scale: Hurts a little bit Pain Location: distal  RLE Pain Descriptors / Indicators: Sore Pain Intervention(s): Monitored during session, Limited activity within patient's tolerance, Repositioned    Home Living                          Prior Function            PT Goals (current goals can now be found in the care plan section) Acute Rehab PT Goals Patient Stated Goal: To get back home and be independent PT Goal Formulation: With patient Time For Goal Achievement: 04/06/22 Potential to Achieve Goals: Good Progress towards PT goals: Progressing toward goals    Frequency    7X/week      PT Plan Current plan remains appropriate    Co-evaluation              AM-PAC PT "6 Clicks" Mobility   Outcome Measure  Help needed turning from your back to your side while in a flat bed without using bedrails?: A Little Help needed moving from lying on your back to sitting on the side of a flat bed without using bedrails?: A Little Help needed moving to and from a bed to a chair (including a wheelchair)?: A Little Help needed standing up from a chair using your arms (e.g., wheelchair or bedside chair)?: A Lot Help needed to walk in hospital room?: Total Help needed climbing 3-5 steps with a railing? : Total 6 Click Score: 13    End of Session   Activity Tolerance: Patient tolerated treatment well Patient left: in bed;with call bell/phone within reach;with bed alarm set;with SCD's reapplied Nurse Communication: Mobility status PT Visit Diagnosis: Difficulty in walking, not elsewhere classified (R26.2);Muscle weakness (generalized) (M62.81)     Time: [id number] PT Time Calculation (min) (ACUTE ONLY): 17 min  Charges:  $Therapeutic Activity: 8-22 mins                     Melanie Juarez, PT, MPT   Melanie Juarez 03/27/2022, 3:31 PM

## 2022-03-28 DIAGNOSIS — E11628 Type 2 diabetes mellitus with other skin complications: Secondary | ICD-10-CM

## 2022-03-28 DIAGNOSIS — L089 Local infection of the skin and subcutaneous tissue, unspecified: Secondary | ICD-10-CM

## 2022-03-28 LAB — CBC WITH DIFFERENTIAL/PLATELET
Abs Immature Granulocytes: 0.45 10*3/uL — ABNORMAL HIGH (ref 0.00–0.07)
Basophils Absolute: 0.1 10*3/uL (ref 0.0–0.1)
Basophils Relative: 1 %
Eosinophils Absolute: 0.3 10*3/uL (ref 0.0–0.5)
Eosinophils Relative: 2 %
HCT: 22.8 % — ABNORMAL LOW (ref 36.0–46.0)
Hemoglobin: 7.4 g/dL — ABNORMAL LOW (ref 12.0–15.0)
Immature Granulocytes: 3 %
Lymphocytes Relative: 21 %
Lymphs Abs: 3.4 10*3/uL (ref 0.7–4.0)
MCH: 27.6 pg (ref 26.0–34.0)
MCHC: 32.5 g/dL (ref 30.0–36.0)
MCV: 85.1 fL (ref 80.0–100.0)
Monocytes Absolute: 1.9 10*3/uL — ABNORMAL HIGH (ref 0.1–1.0)
Monocytes Relative: 11 %
Neutro Abs: 10.4 10*3/uL — ABNORMAL HIGH (ref 1.7–7.7)
Neutrophils Relative %: 62 %
Platelets: 277 10*3/uL (ref 150–400)
RBC: 2.68 MIL/uL — ABNORMAL LOW (ref 3.87–5.11)
RDW: 18.6 % — ABNORMAL HIGH (ref 11.5–15.5)
WBC: 16.5 10*3/uL — ABNORMAL HIGH (ref 4.0–10.5)
nRBC: 1.2 % — ABNORMAL HIGH (ref 0.0–0.2)

## 2022-03-28 LAB — TYPE AND SCREEN
ABO/RH(D): B POS
Antibody Screen: NEGATIVE
Unit division: 0

## 2022-03-28 LAB — BASIC METABOLIC PANEL
Anion gap: 7 (ref 5–15)
BUN: 19 mg/dL (ref 8–23)
CO2: 19 mmol/L — ABNORMAL LOW (ref 22–32)
Calcium: 7.6 mg/dL — ABNORMAL LOW (ref 8.9–10.3)
Chloride: 106 mmol/L (ref 98–111)
Creatinine, Ser: 0.98 mg/dL (ref 0.44–1.00)
GFR, Estimated: >60 mL/min (ref >60)
Glucose, Bld: 183 mg/dL — ABNORMAL HIGH (ref 70–99)
Potassium: 3.9 mmol/L (ref 3.5–5.1)
Sodium: 132 mmol/L — ABNORMAL LOW (ref 135–145)

## 2022-03-28 LAB — BPAM RBC
Blood Product Expiration Date: 202402212359
ISSUE DATE / TIME: 202402131029
Unit Type and Rh: 7300

## 2022-03-28 LAB — GLUCOSE, CAPILLARY
Glucose-Capillary: 145 mg/dL — ABNORMAL HIGH (ref 70–99)
Glucose-Capillary: 215 mg/dL — ABNORMAL HIGH (ref 70–99)
Glucose-Capillary: 298 mg/dL — ABNORMAL HIGH (ref 70–99)
Glucose-Capillary: 60 mg/dL — ABNORMAL LOW (ref 70–99)
Glucose-Capillary: 68 mg/dL — ABNORMAL LOW (ref 70–99)
Glucose-Capillary: 79 mg/dL (ref 70–99)

## 2022-03-28 LAB — RETICULOCYTES
Immature Retic Fract: 47.5 % — ABNORMAL HIGH (ref 2.3–15.9)
RBC.: 2.67 MIL/uL — ABNORMAL LOW (ref 3.87–5.11)
Retic Count, Absolute: 137.8 10*3/uL (ref 19.0–186.0)
Retic Ct Pct: 5.2 % — ABNORMAL HIGH (ref 0.4–3.1)

## 2022-03-28 LAB — VITAMIN B12: Vitamin B-12: 208 pg/mL (ref 180–914)

## 2022-03-28 MED ORDER — ENSURE MAX PROTEIN PO LIQD: 11.0000 [oz_av] | Freq: Two times a day (BID) | ORAL | Status: DC

## 2022-03-28 MED ORDER — FE FUM-VIT C-VIT B12-FA 460-60-0.01-1 MG PO CAPS
1.0000 | ORAL_CAPSULE | Freq: Two times a day (BID) | ORAL | Status: DC
  Administered 2022-03-28: 1 via ORAL
  Filled 2022-03-28: qty 1

## 2022-03-28 MED ORDER — GLIPIZIDE 5 MG PO TABS: 2.5000 mg | ORAL_TABLET | Freq: Two times a day (BID) | ORAL | Status: AC

## 2022-03-28 MED ORDER — SENNOSIDES-DOCUSATE SODIUM 8.6-50 MG PO TABS: 1.0000 | ORAL_TABLET | Freq: Every day | ORAL | Status: DC

## 2022-03-28 MED ORDER — ATORVASTATIN CALCIUM 40 MG PO TABS: 40.0000 mg | ORAL_TABLET | Freq: Every day | ORAL | Status: AC

## 2022-03-28 MED ORDER — AMOXICILLIN-POT CLAVULANATE 875-125 MG PO TABS: 1.0000 | ORAL_TABLET | Freq: Two times a day (BID) | ORAL | 0 refills | Status: AC

## 2022-03-28 MED ORDER — AMOXICILLIN-POT CLAVULANATE 875-125 MG PO TABS
1.0000 | ORAL_TABLET | Freq: Two times a day (BID) | ORAL | Status: DC
  Administered 2022-03-28: 1 via ORAL
  Filled 2022-03-28: qty 1

## 2022-03-28 MED ORDER — OXYCODONE-ACETAMINOPHEN 5-325 MG PO TABS: 1.0000 | ORAL_TABLET | ORAL | 0 refills | Status: DC | PRN

## 2022-03-28 MED ORDER — FE FUM-VIT C-VIT B12-FA 460-60-0.01-1 MG PO CAPS: 1.0000 | ORAL_CAPSULE | Freq: Two times a day (BID) | ORAL | 0 refills | Status: DC

## 2022-03-28 MED ORDER — POLYETHYLENE GLYCOL 3350 17 G PO PACK: 17.0000 g | PACK | Freq: Every day | ORAL | 0 refills | Status: DC | PRN

## 2022-03-28 NOTE — TOC Transition Note (Signed)
Transition of Care [facility]) - CM/SW Discharge Note   Patient Details  Name: Melanie Juarez MRN: [id number] Date of Birth: Mar 28, 1945  Transition of Care [facility]) CM/SW Contact:  Candie Chroman, LCSW Phone Number: 03/28/2022, 12:38 PM   Clinical Narrative:   Patient has orders to discharge to [facility] today. RN has already called report. MD has updated daughter by phone and vascular has changed patient's dressing. EMS transport has been arranged and she is next on the list. Asked RN to call daughter when they arrive. No further concerns. CSW signing off.  Final next level of care: Skilled Nursing Facility Barriers to Discharge: Barriers Resolved   Patient Goals and CMS Choice CMS Medicare.gov Compare Post Acute Care list provided to:: Patient Choice offered to / list presented to : Patient, Adult Children  Discharge Placement PASRR number recieved: 03/26/22 PASRR number recieved: 03/26/22            Patient chooses bed at: [facility] Patient to be transferred to facility by: EMS Name of family member notified: Samerah Warshawsky Patient and family notified of of transfer: 03/28/22  Discharge Plan and Services Additional resources added to the After Visit Summary for       Post Acute Care Choice: [place]                               Social Determinants of Health (SDOH) Interventions SDOH Screenings   Food Insecurity: No Food Insecurity (03/20/2022)  Housing: [place]  (03/20/2022)  Transportation Needs: No Transportation Needs (03/20/2022)  Utilities: Not At Risk (03/20/2022)  Tobacco Use: Low Risk  (03/26/2022)     Readmission Risk Interventions     No data to display

## 2022-03-28 NOTE — Progress Notes (Signed)
  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  She has no complaints overnight.  Vitals are remained stable.  Recovering as expected.    Vitals:   03/27/22 2006 03/28/22 0737  BP: (!) 131/49 138/61  Pulse: 81 94  Resp: 16 18  Temp: 98.8 F (37.1 C) 98 F (36.7 C)  SpO2: 100% 98%   Physical Exam: Cardiac:  RRR Lungs:  Lungs clear to auscultation   Incisions:  Right BKA incision with staples covered by dressings clean dry and intact  Extremities:  Right BKA healing as expected  Abdomen:  Positive bowel sounds, soft, nontender, nondistended,  Neurologic: Alert and oriented x 3, neuro intact, answers all questions appropriately   CBC    Component Value Date/Time   WBC 16.5 (H) 03/28/2022 0435   RBC 2.68 (L) 03/28/2022 0435   RBC 2.67 (L) 03/28/2022 0430   HGB 7.4 (L) 03/28/2022 0435   HCT 22.8 (L) 03/28/2022 0435   PLT 277 03/28/2022 0435   MCV 85.1 03/28/2022 0435   MCH 27.6 03/28/2022 0435   MCHC 32.5 03/28/2022 0435   RDW 18.6 (H) 03/28/2022 0435   LYMPHSABS 3.4 03/28/2022 0435   MONOABS 1.9 (H) 03/28/2022 0435   EOSABS 0.3 03/28/2022 0435   BASOSABS 0.1 03/28/2022 0435    BMET    Component Value Date/Time   NA 132 (L) 03/28/2022 0435   K 3.9 03/28/2022 0435   CL 106 03/28/2022 0435   CO2 19 (L) 03/28/2022 0435   GLUCOSE 183 (H) 03/28/2022 0435   BUN 19 03/28/2022 0435   CREATININE 0.98 03/28/2022 0435   CALCIUM 7.6 (L) 03/28/2022 0435   GFRNONAA >60 03/28/2022 0435    INR No results found for: "INR"   Intake/Output Summary (Last 24 hours) at 03/28/2022 1115 Last data filed at 03/28/2022 1005 Gross per 24 hour  Intake 1583.88 ml  Output 1850 ml  Net -266.12 ml     Assessment/Plan:  77 y.o. female is now status post op day #2 from right below the knee amputation closure from previous  guillotine amputation.  2 Days Post-Op   PLAN:  Dressing changed today.  Vascular Surgery okay with discharge to rehab.  Daily Dressing changes to stump. Xeroform guaze strip over staple line, covered with guaze, then covered with ABD Pads, then wrapped with 2 Kerlix rolls to the knee. Then wrapped with 6 inch ace bandage Snug but not to tight to reduce swelling.    Follow up in Vascular Surgery Clinic in 3 weeks for staple removal.      Drema Pry [facility] 03/28/2022 11:15 AM

## 2022-03-28 NOTE — Inpatient Diabetes Management (Addendum)
Inpatient Diabetes Program Recommendations  AACE/ADA: New Consensus Statement on Inpatient Glycemic Control (2015)  Target Ranges:  Prepandial:   less than 140 mg/dL      Peak postprandial:   less than 180 mg/dL (1-2 hours)      Critically ill patients:  140 - 180 mg/dL    Latest Reference Range & Units 03/27/22 07:58 03/27/22 11:39 03/27/22 16:55 03/27/22 21:31  Glucose-Capillary 70 - 99 mg/dL 99   5 mg Glipizide +  1000 mg Metformin 150 (H)  2 units Novolog  99   5 mg Glipizide +  1000 mg Metformin 86  (H): Data is abnormally high  Latest Reference Range & Units 03/28/22 00:01 03/28/22 00:34 03/28/22 01:00 03/28/22 03:56 03/28/22 07:40  Glucose-Capillary 70 - 99 mg/dL 60 (L) 68 (L) 79 145 (H) 215 (H)  (L): Data is abnormally low (H): Data is abnormally high    Home DM Meds: Glipizide 5 mg BID      Metformin 1000 mg BID    Current Orders: Semglee 5 units Daily                            Novolog Moderate Correction Scale/ SSI (0-15 units) TID AC + HS                            Metformin 1000 mg BID                            Glipizide 5 mg BID     MD- Note Semglee 5 units HELD yesterday.  Got home oral DM meds.  Hypoglycemic at Midnight  Please consider:  1. Stop Semglee for now  2. Reduce Glipizide to 2.5 mg BID (50% home dose)     --Will follow patient during hospitalization--  Wyn Quaker RN, MSN, [place] Diabetes Coordinator Inpatient Glycemic Control Team Team Pager: [phone] (8a-5p)

## 2022-03-28 NOTE — Discharge Summary (Signed)
Physician Discharge Summary   Patient: Melanie Juarez MRN: [id number] DOB: [birth year]  Admit date:     03/19/2022  Discharge date: 03/28/22  Discharge Physician: Lorella Nimrod   PCP: System, Provider Not In   Recommendations at discharge:  Please obtain CBC and BMP within next few days We decreased the dose of glipizide due to concern of hypoglycemia, please monitor and titrate the dose as needed. Follow-up with vascular surgery Please follow the directions for wound care Follow-up with primary care provider  Discharge Diagnoses: Principal Problem:   Osteomyelitis ([place]) Active Problems:   Diabetic infection of right foot ([place])   Essential hypertension   Leukocytosis   AKI (acute kidney injury) ([place])   Sepsis with acute renal failure without septic shock (H[facility])   Lactic acidosis   Obesity (BMI 30-39.9)   Hyperlipidemia   PAD (peripheral artery disease) [facility])   Hospital Course: Melanie Juarez is a 77 year old female with history of diabetes mellitus type 2, hypertension, 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  Initial vitals in the ED showed temperature of 98.6, respiration rate of 19, heart rate 95, blood pressure 127/63, SpO2 of 97% on room air.  Serum sodium is 124, potassium 5.0, chloride 88, bicarb 24, BUN of 28, serum creatinine of 1.36, EGFR 41, nonfasting blood glucose 422, WBC 19.2, hemoglobin 10.6, platelets of 352.  Lactic acid is 1.4.  ED treatment: Ceftriaxone 2 g IV daily, 7 days ordered; Flagyl 500 mg IV every 12 hours 7 days ordered, vancomycin one-time dose  Podiatry and vascular surgery consulted. 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   2/12, underwent revision of right BKA and wound  closure. Blood cultures remain negative.  Patient received broad-spectrum spectrum antibiotics while in the hospital and is being discharged on 5 more days of Augmentin to complete a 2-week course,  Patient was on metformin and 5 mg of glipizide twice daily at home, she developed some hypoglycemia, we decreased the dose of glipizide to 2.5 mg twice daily and her PCP can titrate as needed.  Vascular surgery provided wound care instructions which need to be followed.  She is also being provided with pain medications to be used as needed.  Please be mindful about constipation and use MiraLAX as needed.  Patient will follow-up with vascular surgery for further recommendations.  Patient was on atenolol and chlorthalidone at home for blood pressure control.  Chlorthalidone was discontinued and she was started on Lasix along with atenolol.  Patient did developed postprocedural acute on chronic anemia.  Baseline hemoglobin around 10.  It decreased to 6.6, most likely secondary to blood loss with surgery.  Received 1 unit of PRBC and also started on supplement which need to be continued.  Please follow-up final labs on anemia panel.  Patient will continue on current medications and need to have a close follow-up with his providers.   Pain control - [employer] Controlled Substance Reporting System database was reviewed. and patient was instructed, not to drive, operate heavy machinery, perform activities at heights, swimming or participation in water activities or provide baby-sitting services while on Pain, Sleep and Anxiety Medications; until their outpatient Physician has advised to do so again. Also recommended to not to take more than prescribed Pain, Sleep and Anxiety Medications.  Consultants:  Podiatry, vascular surgery Procedures performed: BKA Disposition: Skilled nursing facility Diet recommendation:  Discharge Diet Orders (From admission, onward)     Start     Ordered   03/28/22 0000   Diet - low sodium heart healthy        03/28/22 1113           Cardiac and Carb modified diet DISCHARGE MEDICATION: Allergies as of 03/28/2022       Reactions   Tomato    Mouth breaks out        Medication List     STOP taking these medications    chlorthalidone 25 MG tablet Commonly known as: HYGROTON       TAKE these medications    amoxicillin-clavulanate 875-125 MG tablet Commonly known as: AUGMENTIN Take 1 tablet by mouth every 12 (twelve) hours for 5 days.   atenolol 50 MG tablet Commonly known as: TENORMIN Take 50 mg by mouth daily.   atorvastatin 40 MG tablet Commonly known as: LIPITOR Take 1 tablet (40 mg total) by mouth daily. Start taking on: March 29, 2022   diphenhydrAMINE 25 MG tablet Commonly known as: BENADRYL Take 0.5 tablets (12.5 mg total) by mouth every 6 (six) hours as needed for up to 5 days for itching.   Ensure Max Protein Liqd Take 330 mLs (11 oz total) by mouth 2 (two) times daily.   Fe Fum-Vit C-Vit B12-FA Caps capsule Commonly known as: TRIGELS-F FORTE Take 1 capsule by mouth 2 (two) times daily.   glipiZIDE 5 MG tablet Commonly known as: GLUCOTROL Take 0.5 tablets (2.5 mg total) by mouth 2 (two) times daily before a meal. What changed: how much to take   metFORMIN 500 MG tablet Commonly known as: GLUCOPHAGE Take 1,000 mg by mouth 2 (two) times daily.   oxyCODONE-acetaminophen 5-325 MG tablet Commonly known as: PERCOCET/ROXICET Take 1 tablet by mouth every 4 (four) hours as needed for severe pain.   polyethylene glycol 17 g packet Commonly known as: MIRALAX / GLYCOLAX Take 17 g by mouth daily as needed for moderate constipation.   senna-docusate 8.6-50 MG tablet Commonly known as: Senokot-S Take 1 tablet by mouth at bedtime.               Discharge Care Instructions  (From admission, onward)           Start     Ordered   03/28/22 0000  Discharge wound care:       Comments: Please follow the  directions from vascular surgery.   03/28/22 1113            Contact information for after-discharge care     [facility] .   Service: Skilled Nursing Contact information: [place] [phone]                    Discharge Exam: Danley Danker Weights   03/22/22 1209 03/24/22 0415 03/26/22 0703  Weight: 90.7 kg 96.3 kg 96.3 kg   General.     In no acute distress. Pulmonary.  Lungs clear bilaterally, normal respiratory effort. CV.  Regular rate and rhythm, no JVD, rub or murmur. Abdomen.  Soft, nontender, nondistended, BS positive. CNS.  Alert and oriented .  No focal neurologic deficit. Extremities.  Right BKA with bandage and Ace wrap Psychiatry.  Judgment and insight appears normal.   Condition at  discharge: stable  The results of significant diagnostics from this hospitalization (including imaging, microbiology, ancillary and laboratory) are listed below for reference.   Imaging Studies: PERIPHERAL VASCULAR CATHETERIZATION  Result Date: 03/21/2022 See surgical note for result.  MRI Right foot without contrast  Result Date: 03/19/2022 CLINICAL DATA:  Soft tissue infection suspected. Findings concerning for osteomyelitis of the distal fourth greater than fifth metatarsals and base of the fourth proximal phalanx on today's radiographs. EXAM: MRI OF THE RIGHT FOREFOOT WITHOUT CONTRAST TECHNIQUE: Multiplanar, multisequence MR imaging of the right forefoot was performed. No intravenous contrast was administered. COMPARISON:  Right foot radiographs 03/19/2022 FINDINGS: Bones/Joint/Cartilage As seen on today's radiographs, there is erosion of the lateral greater than medial aspect of the fourth metatarsal head and the proximal aspect of the proximal phalanx of the fourth toe. There also appears to be high-grade erosion of the distal  phalanx of the fourth toe. There is high-grade marrow edema throughout the majority of the proximal distal phalanges of the fourth toe. High-grade marrow edema throughout the distal 50% of the fourth metatarsal with mild reactive marrow edema within the proximal shaft of the fourth metatarsal. There is a moderate to high-grade marrow edema within the proximal 40% of the proximal phalanx of the fifth toe with mild cortical step-off and likely erosion of the plantar proximal aspect (sagittal series 10, image 4, coronal series 9 image 14). Although there appear to be mild erosion of the medial aspect of the fifth metatarsal head on today's radiographs, no definitive cortical erosion is seen within the fifth metatarsal head on the current MRI. There is fluid and apparent soft tissue ulceration within the plantar lateral aspect of the proximal fifth toe (coronal series 9 images 13 and 14 and axial series 6 images 04/03/2021). Mild marrow edema within the plantar and lateral aspect of the third metatarsal head without definite cortical erosion. Mild-to-moderate lateral great toe metatarsal head degenerative spurring. Moderate to severe third tarsometatarsal joint space narrowing and subchondral degenerative cystic change. Ligaments The Lisfranc ligament complex is intact. Muscles and Tendons There is moderate 4th flexor digitorum longus tenosynovitis in the midfoot through the fourth metatarsal, with higher grade lobular tenosynovitis at the level of the phalanges. Soft tissues There is lobular decreased T1 and increased T2 signal fluid within the region of the fourth metatarsal head through the middle phalanx concerning for an abscess measuring up to approximately 2.0 x 2.4 x 3.6 cm in greatest transverse by short axis of the foot by long axis of the foot dimensions (axial image 18, sagittal image 8, coronal image 11). There is moderate edema and swelling of the dorsal medial midfoot and plantar medial midfoot  subcutaneous fat. Moderate to high-grade subcutaneous fat edema and moderate swelling of the plantar lateral forefoot subcutaneous fat from the level of the distal metatarsal through the phalanges of the fourth digit. IMPRESSION: 1. There is high-grade marrow edema and cortical erosion indicating acute osteomyelitis of the fourth metatarsal head and fourth toe proximal distal phalanges. There is also high-grade marrow edema within the distal 50% of the fourth metatarsal with more mild marrow edema within the proximal shaft of the fourth metatarsal. This metatarsal shaft edema may be secondary to osteomyelitis or reactive. Findings concerning for moderate abscess surrounding the proximal phalanx and distal metatarsal of the fourth toe. 2. There is moderate to high-grade marrow edema within the proximal 40% of the proximal phalanx of the fifth toe with mild cortical step-off and likely erosion of the plantar proximal  aspect. There is fluid and apparent soft tissue ulceration within the plantar lateral aspect of the proximal fifth toe. Findings are suspicious for acute osteomyelitis of the proximal phalanx of the fifth toe with a small adjacent abscess and soft tissue ulceration. 3. There is a lobular fluid collection within the region of the fourth metatarsal head through the middle phalanx concerning for an abscess. 4. Mild marrow edema within the plantar and lateral aspect of the third metatarsal head without definite cortical erosion. This may represent noninfectious osteitis. 5. Moderate 4th flexor digitorum longus likely septic tenosynovitis in the midfoot through the fourth metatarsal, with higher grade lobular likely septic tenosynovitis at the level of the phalanges of the fourth digit. 6. 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 Electronically Signed   By: Yvonne Kendall M.D.   On: 03/19/2022 18:19   MR ANKLE RIGHT WO CONTRAST  Result Date: 03/19/2022 CLINICAL DATA:  Soft tissue infection  suspected. 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 Patient reports swelling, pain, and foul smell coming from right foot for a couple of weeks. First noticed a blister between third and fourth toes approximately 2 weeks ago that has gotten progressively more swollen and malodorous. 5 2 painful to walk. EXAM: MRI OF THE RIGHT ANKLE WITHOUT CONTRAST TECHNIQUE: Multiplanar, multisequence MR imaging of the ankle was performed. No intravenous contrast was administered. COMPARISON:  Right foot radiographs 03/19/2022 FINDINGS: Despite efforts by the technologist and patient, motion artifact is present on today's exam and could not be eliminated. This reduces exam sensitivity and specificity. TENDONS Peroneal: The peroneus longus and brevis tendons are intact. Posteromedial: The posterior tibial tendon is intact. Moderate flexor digitorum longus tenosynovitis at the level of the distal tibia. There is mild-to-moderate marrow edema within the adjacent posterior aspect of the medial malleolus (axial series 8 image 22, coronal series 7, image 23). Note is made of history of right great toe pharyngeal amputation, and the distal aspect of the flexor hallucis longus tendon is not visualized distal to the midfoot, consistent with the prior surgery. Anterior: The tibialis anterior, extensor hallucis longus, and extensor digitorum longus tendons are intact. Achilles: Intact. Plantar Fascia: Intact. LIGAMENTS Lateral: The anterior and posterior talofibular, anterior and posterior tibiofibular, and calcaneofibular ligaments are intact. Medial: The tibiotalar deep deltoid and tibial spring ligaments are grossly intact, within limitations of motion artifact. CARTILAGE Ankle Joint: Mild-to-moderate anterior tibiotalar cartilage thinning. Subtalar Joints/Sinus Tarsi: Fat is preserved within sinus tarsi. Moderate posterior aspect of the posterior subtalar joint cartilage thinning  with mild peripheral degenerative osteophytes. Bones: Moderate talonavicular and navicular-cuneiform cartilage thinning and dorsal osteophytosis. There is marrow edema within the partially visualized fourth metatarsal shaft. Other: There is decreased T1 and increased T2 signal fluid diffusely extending in between the posterior tibial, flexor digitorum longus, and the flexor hallucis longus muscles and tendons and the overlying medial and lateral gastrocnemius musculotendinous junctions at the proximal Achilles (axial images 1 through 25). This fluid extends off of the superior plane of view. The imaged portion of this fluid measures up to 3.8 cm in greatest transverse dimension, 2.1 cm in greatest AP dimension, and 12 cm in greatest craniocaudal length. This may connect to smaller regions of fluid seen medial to the posterior tibial and flexor digitorum longus tendons, within the subcutaneous fat of the medial ankle at the level of the distal tibia. There is also moderate edema and swelling of the lateral and posterolateral ankle  subcutaneous fat. No definite soft tissue ulcer or marrow edema is visualized. The Lisfranc ligament complex is intact. There is mild-to-moderate tenosynovitis of the visualized flexor digitorum longus within the midfoot. IMPRESSION: 1. Moderate flexor digitorum longus tenosynovitis at the level of the distal tibia. The possible presence of infection within this tendon sheath fluid cannot be determined by MRI. There is mild-to-moderate marrow edema within the adjacent posterior aspect of the medial malleolus. This may be stress related/reactive. It is difficult to exclude early osteomyelitis. 2. Mild-to-moderate tenosynovitis of the visualized flexor digitorum longus within the midfoot. MRI cannot exclude infection within this tendon sheath fluid. 3. There is marrow edema within the fourth metatarsal shaft. Please see contemporaneous MRI of the forefoot report for further description of  findings of fourth metatarsal osteomyelitis. 4. There is fluid diffusely extending in between the posterior tibial, flexor digitorum longus, and flexor hallucis longus muscles and tendons and the overlying medial and lateral gastrocnemius musculotendinous junctions at the proximal Achilles. This fluid extends off of the superior plane of view. This may represent a large seroma or abscess. This may connect to small regions of fluid seen medial to the posterior tibial and flexor digitorum longus, within the subcutaneous fat of the medial ankle at the level of the distal tibia. 5. Moderate edema and swelling of the lateral and posterolateral ankle subcutaneous fat. Electronically Signed   By: Yvonne Kendall M.D.   On: 03/19/2022 17:48   DG Foot Complete Right  Result Date: 03/19/2022 CLINICAL DATA:  Assess for possible right foot osteomyelitis. Localized pain. EXAM: RIGHT FOOT COMPLETE - 3+ VIEW COMPARISON:  None. FINDINGS: Bony resorption with associated osteopenia of the distal fourth and fifth metatarsals, including the metatarsal heads, more severely of the fourth and the fifth. Suspected resorption at the base of the proximal phalanx of the fourth toe, possibly also of the fifth, assessment limited by positioning. No other evidence of osteomyelitis. Previous amputation of the great toe. Joints are normally aligned. Soft tissue swelling of the forefoot, and most prominently of the fourth toe. IMPRESSION: 1. Positive for osteomyelitis. There is osteomyelitis of the distal fourth and fifth metatarsals, more severely of the fourth, and of the base of the fourth toe proximal phalanx, possibly of the base of the fifth toe proximal phalanx. Electronically Signed   By: Lajean Manes M.D.   On: 03/19/2022 14:58    Microbiology: Results for orders placed or performed during the hospital encounter of 03/19/22  Culture, blood (Routine X 2) w Reflex to ID Panel     Status: None   Collection Time: 03/19/22  6:11 PM    Specimen: BLOOD  Result Value Ref Range Status   Specimen Description BLOOD BLOOD RIGHT HAND  Final   Special Requests   Final    BOTTLES DRAWN AEROBIC AND ANAEROBIC Blood Culture adequate volume   Culture   Final    NO GROWTH 5 DAYS Performed at [facility], [address] [place], [place] [address]    Report Status 03/24/2022 FINAL  Final  Culture, blood (Routine X 2) w Reflex to ID Panel     Status: None   Collection Time: 03/19/22  6:18 PM   Specimen: BLOOD  Result Value Ref Range Status   Specimen Description BLOOD BLOOD LEFT HAND  Final   Special Requests   Final    BOTTLES DRAWN AEROBIC ONLY Blood Culture results may not be optimal due to an inadequate volume of blood received in culture bottles  Culture   Final    NO GROWTH 5 DAYS Performed at [facility], [address] [place], [place] [address]    Report Status 03/24/2022 FINAL  Final    Labs: CBC: Recent Labs  Lab 03/23/22 0318 03/24/22 0532 03/25/22 0535 03/27/22 0404 03/27/22 2030 03/28/22 0435  WBC 14.7* 13.6* 12.0* 19.7*  --  16.5*  NEUTROABS  --  8.7* 6.5  --   --  10.4*  HGB 8.1* 7.5* 8.1* 6.6* 7.7* 7.4*  HCT 25.2* 23.6* 25.0* 20.5* 23.5* 22.8*  MCV 82.6 83.4 83.3 85.1  --  85.1  PLT 309 307 322 303  --  [id number]   Basic Metabolic Panel: Recent Labs  Lab 03/22/22 0459 03/24/22 0532 03/25/22 0535 03/26/22 0622 03/27/22 0404 03/28/22 0435  NA 132* 134* 134*  --   --  132*  K 3.6 3.5 4.0  --   --  3.9  CL 104 106 105  --   --  106  CO2 23 23 24  $ --   --  19*  GLUCOSE 170* 139* 148*  --   --  183*  BUN 16 15 16  $ --   --  19  CREATININE 0.76 0.77 0.87 0.85 0.86 0.98  CALCIUM 7.4* 7.5* 7.8*  --   --  7.6*  MG 1.2* 1.8  --   --   --   --   PHOS 2.5 1.8*  --   --   --   --    Liver Function Tests: Recent Labs  Lab 03/22/22 0459 03/24/22 0532  ALBUMIN 1.6* 1.7*   CBG: Recent Labs  Lab 03/28/22 0001 03/28/22 0034 03/28/22 0100 03/28/22 0356 03/28/22 0740  GLUCAP  60* 68* 79 145* 215*    Discharge time spent: greater than 30 minutes.  This record has been created using Systems analyst. Errors have been sought and corrected,but may not always be located. Such creation errors do not reflect on the standard of care.   Signed: Lorella Nimrod, MD [facility] 03/28/2022

## 2022-03-29 ENCOUNTER — Telehealth (INDEPENDENT_AMBULATORY_CARE_PROVIDER_SITE_OTHER): Payer: Self-pay

## 2022-03-29 NOTE — Telephone Encounter (Signed)
Called [facility] and spoke with [place]. Wound instructions given for patient. 03/29/22

## 2022-03-29 NOTE — Telephone Encounter (Signed)
Place Xeroform over the wound and cover with a dry dressing change daily

## 2022-03-29 NOTE — Telephone Encounter (Signed)
Melanie Juarez from [facility] called about discharge wound care for a procedure done on 03/26/22. Melanie Juarez had a right BKA. She said when you call, you can speak to Her or Melanie Juarez at [phone].

## 2022-04-19 ENCOUNTER — Ambulatory Visit (INDEPENDENT_AMBULATORY_CARE_PROVIDER_SITE_OTHER): Payer: Medicare Other | Admitting: Nurse Practitioner

## 2022-04-19 ENCOUNTER — Encounter (INDEPENDENT_AMBULATORY_CARE_PROVIDER_SITE_OTHER): Payer: Self-pay | Admitting: Nurse Practitioner

## 2022-04-19 VITALS — BP 117/74 | HR 79 | Resp 18 | Ht 67.5 in | Wt 188.0 lb

## 2022-04-19 DIAGNOSIS — Z89511 Acquired absence of right leg below knee: Secondary | ICD-10-CM

## 2022-04-26 ENCOUNTER — Telehealth (INDEPENDENT_AMBULATORY_CARE_PROVIDER_SITE_OTHER): Payer: Self-pay

## 2022-04-26 NOTE — Telephone Encounter (Signed)
[facility] home health nurse Luellen Pucker reach out to the office requesting wound care orders right BKA revision done on 03/26/22. Please Advise

## 2022-04-27 NOTE — Telephone Encounter (Signed)
Home health nurse notified with orders

## 2022-04-30 ENCOUNTER — Encounter (INDEPENDENT_AMBULATORY_CARE_PROVIDER_SITE_OTHER): Payer: Self-pay | Admitting: Nurse Practitioner

## 2022-04-30 NOTE — Progress Notes (Signed)
Subjective:    Patient ID: Melanie Juarez, female    DOB: 1945-12-21, 77 y.o.   MRN: [id number] Chief Complaint  Patient presents with   Follow-up    Follow up for staple removal and wound check.    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  The patient had a revision on 03/26/2022 and she has done well.  She denies any significant phantom limb symptoms.  She denies any evidence of dehiscence.  Overall she is progressing without issue.    Review of Systems  Musculoskeletal:  Positive for gait problem.  Skin:  Positive for wound.  All other systems reviewed and are negative.      Objective:   Physical Exam Vitals reviewed.  HENT:     Head: Normocephalic.  Cardiovascular:     Rate and Rhythm: Normal rate.  Pulmonary:     Effort: Pulmonary effort is normal.  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  Skin:    General: Skin is warm and dry.  Neurological:     Mental Status: She is alert and oriented to person, place, and time.     Gait: Gait normal.  Psychiatric:        Mood and Affect: Mood normal.        Behavior: Behavior normal.        Thought Content: Thought content normal.        Judgment: Judgment normal.     BP 117/74 (BP Location: Left Arm)   Pulse 79   Resp 18   Ht 5' 7.5" (1.715 m)   Wt 188 lb (85.3 kg)   BMI 29.01 kg/m   Past Medical History:  Diagnosis Date   Diabetes ([place])    Hypertension     Social History   Socioeconomic History   Marital status: Divorced    Spouse name: Not on file   Number of children: Not on file   Years of education: Not on file   Highest education level: Not on file  Occupational History   Not on file  Tobacco Use   Smoking status: Never   Smokeless tobacco: Never  Substance and Sexual Activity    Alcohol use: Never   Drug use: Never   Sexual activity: Not Currently  Other Topics Concern   Not on file  Social History Narrative   Not on file   Social Determinants of Health   Financial Resource Strain: Not on file  Food Insecurity: No Food Insecurity (03/20/2022)   Hunger Vital Sign    Worried About Running Out of Food in the Last Year: Never true    Ran Out of Food in the Last Year: Never true  Transportation Needs: No Transportation Needs (03/20/2022)   PRAPARE - Hydrologist (Medical): No    Lack of Transportation (Non-Medical): No  Physical Activity: Not on file  Stress: Not on file  Social Connections: Not on file  Intimate Partner Violence: Not At Risk (03/20/2022)   Humiliation, Afraid, Rape, and Kick questionnaire    Fear of Current or Ex-Partner: No    Emotionally Abused: No    Physically Abused: No    Sexually Abused: No    Past Surgical History:  Procedure Laterality Date   AMPUTATION Right 03/22/2022  Procedure: AMPUTATION BELOW KNEE-Guillotine;  Surgeon: Algernon Huxley, MD;  Location: [facility];  Service: Vascular;  Laterality: Right;   LOWER EXTREMITY ANGIOGRAPHY Right 03/21/2022   Procedure: Lower Extremity Angiography;  Surgeon: Algernon Huxley, MD;  Location: [place] [facility];  Service: Cardiovascular;  Laterality: Right;   STUMP REVISION Right 03/26/2022   Procedure: BKA REVISION;  Surgeon: Algernon Huxley, MD;  Location: [facility];  Service: General;  Laterality: Right;    History reviewed. No pertinent family history.  Allergies  Allergen Reactions   Tomato     Mouth breaks out       Latest Ref Rng & Units 03/28/2022    4:35 AM 03/27/2022    8:30 PM 03/27/2022    4:04 AM  CBC  WBC 4.0 - 10.5 K/uL 16.5   19.7   Hemoglobin 12.0 - 15.0 g/dL 7.4  7.7  6.6   Hematocrit 36.0 - 46.0 % 22.8  23.5  20.5   Platelets 150 - 400 K/uL 277   303       CMP     Component Value Date/Time   NA 132 (L) 03/28/2022 0435   K 3.9 03/28/2022  0435   CL 106 03/28/2022 0435   CO2 19 (L) 03/28/2022 0435   GLUCOSE 183 (H) 03/28/2022 0435   BUN 19 03/28/2022 0435   CREATININE 0.98 03/28/2022 0435   CALCIUM 7.6 (L) 03/28/2022 0435   ALBUMIN 1.7 (L) 03/24/2022 0532   GFRNONAA >60 03/28/2022 0435     No results found.     Assessment & Plan:   1. Hx of right BKA ([place]) Today every other staple was removed and Steri-Strips applied.  The patient has good healing of the incision thus far.  No significant dehiscence.  Will have her return in 7 to 10 days for further staple removal.   Current Outpatient Medications on File Prior to Visit  Medication Sig Dispense Refill   atenolol (TENORMIN) 50 MG tablet Take 50 mg by mouth daily.     atorvastatin (LIPITOR) 40 MG tablet Take 1 tablet (40 mg total) by mouth daily.     Ensure Max Protein (ENSURE MAX PROTEIN) LIQD Take 330 mLs (11 oz total) by mouth 2 (two) times daily.     Fe Fum-Vit C-Vit B12-FA (TRIGELS-F FORTE) CAPS capsule Take 1 capsule by mouth 2 (two) times daily.  0   glipiZIDE (GLUCOTROL) 5 MG tablet Take 0.5 tablets (2.5 mg total) by mouth 2 (two) times daily before a meal.     metFORMIN (GLUCOPHAGE) 500 MG tablet Take 1,000 mg by mouth 2 (two) times daily.     oxyCODONE-acetaminophen (PERCOCET/ROXICET) 5-325 MG tablet Take 1 tablet by mouth every 4 (four) hours as needed for severe pain. 30 tablet 0   polyethylene glycol (MIRALAX / GLYCOLAX) 17 g packet Take 17 g by mouth daily as needed for moderate constipation. 14 each 0   senna-docusate (SENOKOT-S) 8.6-50 MG tablet Take 1 tablet by mouth at bedtime.     diphenhydrAMINE (BENADRYL) 25 MG tablet Take 0.5 tablets (12.5 mg total) by mouth every 6 (six) hours as needed for up to 5 days for itching. 10 tablet 0   No current facility-administered medications on file prior to visit.    There are no Patient Instructions on file for this visit. No follow-ups on file.   Kris Hartmann, NP

## 2022-05-01 ENCOUNTER — Ambulatory Visit (INDEPENDENT_AMBULATORY_CARE_PROVIDER_SITE_OTHER): Payer: Medicare Other | Admitting: Nurse Practitioner

## 2022-05-01 VITALS — Ht 67.0 in | Wt 187.0 lb

## 2022-05-01 DIAGNOSIS — Z89511 Acquired absence of right leg below knee: Secondary | ICD-10-CM

## 2022-05-02 ENCOUNTER — Encounter (INDEPENDENT_AMBULATORY_CARE_PROVIDER_SITE_OTHER): Payer: Self-pay | Admitting: Nurse Practitioner

## 2022-05-02 NOTE — Progress Notes (Signed)
Subjective:    Patient ID: Melanie Juarez, female    DOB: March 25, 1945, 77 y.o.   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  The patient had a revision on 03/26/2022 and she has done well.  She denies any significant phantom limb symptoms.  She denies any evidence of dehiscence.  Overall she is progressing without issue.    Review of Systems  Musculoskeletal:  Positive for gait problem.  Skin:  Positive for wound.  All other systems reviewed and are negative.      Objective:   Physical Exam Vitals reviewed.  HENT:     Head: Normocephalic.  Cardiovascular:     Rate and Rhythm: Normal rate.  Pulmonary:     Effort: Pulmonary effort is normal.  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  Skin:    General: Skin is warm and dry.  Neurological:     Mental Status: She is alert and oriented to person, place, and time.     Gait: Gait normal.  Psychiatric:        Mood and Affect: Mood normal.        Behavior: Behavior normal.        Thought Content: Thought content normal.        Judgment: Judgment normal.     Ht 5\' 7"  (1.702 m)   Wt 187 lb (84.8 kg)   BMI 29.29 kg/m   Past Medical History:  Diagnosis Date   Diabetes ([place])    Hypertension     Social History   Socioeconomic History   Marital status: Divorced    Spouse name: Not on file   Number of children: Not on file   Years of education: Not on file   Highest education level: Not on file  Occupational History   Not on file  Tobacco Use   Smoking status: Never   Smokeless tobacco: Never  Substance and Sexual Activity   Alcohol use: Never   Drug use: Never   Sexual activity: Not Currently  Other Topics Concern    Not on file  Social History Narrative   Not on file   Social Determinants of Health   Financial Resource Strain: Not on file  Food Insecurity: No Food Insecurity (03/20/2022)   Hunger Vital Sign    Worried About Running Out of Food in the Last Year: Never true    Ran Out of Food in the Last Year: Never true  Transportation Needs: No Transportation Needs (03/20/2022)   PRAPARE - Hydrologist (Medical): No    Lack of Transportation (Non-Medical): No  Physical Activity: Not on file  Stress: Not on file  Social Connections: Not on file  Intimate Partner Violence: Not At Risk (03/20/2022)   Humiliation, Afraid, Rape, and Kick questionnaire    Fear of Current or Ex-Partner: No    Emotionally Abused: No    Physically Abused: No    Sexually Abused: No    Past Surgical History:  Procedure Laterality Date   AMPUTATION Right 03/22/2022   Procedure: AMPUTATION BELOW KNEE-Guillotine;  Surgeon: Algernon Huxley, MD;  Location: [facility];  Service: Vascular;  Laterality: Right;  LOWER EXTREMITY ANGIOGRAPHY Right 03/21/2022   Procedure: Lower Extremity Angiography;  Surgeon: Algernon Huxley, MD;  Location: [place] [facility];  Service: Cardiovascular;  Laterality: Right;   STUMP REVISION Right 03/26/2022   Procedure: BKA REVISION;  Surgeon: Algernon Huxley, MD;  Location: [facility];  Service: General;  Laterality: Right;    No family history on file.  Allergies  Allergen Reactions   Tomato     Mouth breaks out       Latest Ref Rng & Units 03/28/2022    4:35 AM 03/27/2022    8:30 PM 03/27/2022    4:04 AM  CBC  WBC 4.0 - 10.5 K/uL 16.5   19.7   Hemoglobin 12.0 - 15.0 g/dL 7.4  7.7  6.6   Hematocrit 36.0 - 46.0 % 22.8  23.5  20.5   Platelets 150 - 400 K/uL 277   303       CMP     Component Value Date/Time   NA 132 (L) 03/28/2022 0435   K 3.9 03/28/2022 0435   CL 106 03/28/2022 0435   CO2 19 (L) 03/28/2022 0435   GLUCOSE 183 (H) 03/28/2022 0435   BUN 19  03/28/2022 0435   CREATININE 0.98 03/28/2022 0435   CALCIUM 7.6 (L) 03/28/2022 0435   ALBUMIN 1.7 (L) 03/24/2022 0532   GFRNONAA >60 03/28/2022 0435     No results found.     Assessment & Plan:   1. Hx of right BKA ([place]) All staples removed today.  There was some slight dehiscence with some staple removal and so that strips were applied in addition to Steri-Strips.  Will have the patient return in 2 to 3 weeks for wound reevaluation.   Current Outpatient Medications on File Prior to Visit  Medication Sig Dispense Refill   atenolol (TENORMIN) 50 MG tablet Take 50 mg by mouth daily.     atorvastatin (LIPITOR) 40 MG tablet Take 1 tablet (40 mg total) by mouth daily.     Ensure Max Protein (ENSURE MAX PROTEIN) LIQD Take 330 mLs (11 oz total) by mouth 2 (two) times daily.     Fe Fum-Vit C-Vit B12-FA (TRIGELS-F FORTE) CAPS capsule Take 1 capsule by mouth 2 (two) times daily.  0   glipiZIDE (GLUCOTROL) 5 MG tablet Take 0.5 tablets (2.5 mg total) by mouth 2 (two) times daily before a meal.     metFORMIN (GLUCOPHAGE) 500 MG tablet Take 1,000 mg by mouth 2 (two) times daily.     oxyCODONE-acetaminophen (PERCOCET/ROXICET) 5-325 MG tablet Take 1 tablet by mouth every 4 (four) hours as needed for severe pain. 30 tablet 0   polyethylene glycol (MIRALAX / GLYCOLAX) 17 g packet Take 17 g by mouth daily as needed for moderate constipation. 14 each 0   senna-docusate (SENOKOT-S) 8.6-50 MG tablet Take 1 tablet by mouth at bedtime.     diphenhydrAMINE (BENADRYL) 25 MG tablet Take 0.5 tablets (12.5 mg total) by mouth every 6 (six) hours as needed for up to 5 days for itching. 10 tablet 0   No current facility-administered medications on file prior to visit.    There are no Patient Instructions on file for this visit. No follow-ups on file.   Kris Hartmann, NP

## 2022-05-10 ENCOUNTER — Other Ambulatory Visit (INDEPENDENT_AMBULATORY_CARE_PROVIDER_SITE_OTHER): Payer: Self-pay | Admitting: Nurse Practitioner

## 2022-05-10 MED ORDER — DOXYCYCLINE HYCLATE 100 MG PO CAPS: 100.0000 mg | ORAL_CAPSULE | Freq: Two times a day (BID) | ORAL | 0 refills | Status: DC

## 2022-05-17 ENCOUNTER — Ambulatory Visit (INDEPENDENT_AMBULATORY_CARE_PROVIDER_SITE_OTHER): Payer: Medicare Other | Admitting: Nurse Practitioner

## 2022-05-17 ENCOUNTER — Encounter (INDEPENDENT_AMBULATORY_CARE_PROVIDER_SITE_OTHER): Payer: Self-pay | Admitting: Nurse Practitioner

## 2022-05-17 VITALS — BP 134/75 | HR 88 | Resp 16

## 2022-05-17 DIAGNOSIS — Z89511 Acquired absence of right leg below knee: Secondary | ICD-10-CM

## 2022-05-24 ENCOUNTER — Ambulatory Visit (INDEPENDENT_AMBULATORY_CARE_PROVIDER_SITE_OTHER): Payer: Medicare Other | Admitting: Nurse Practitioner

## 2022-06-07 ENCOUNTER — Ambulatory Visit (INDEPENDENT_AMBULATORY_CARE_PROVIDER_SITE_OTHER): Payer: Medicare Other | Admitting: Nurse Practitioner

## 2022-06-07 ENCOUNTER — Other Ambulatory Visit (INDEPENDENT_AMBULATORY_CARE_PROVIDER_SITE_OTHER): Payer: Self-pay | Admitting: Nurse Practitioner

## 2022-06-07 VITALS — BP 174/85 | HR 82 | Resp 17

## 2022-06-07 DIAGNOSIS — Z89511 Acquired absence of right leg below knee: Secondary | ICD-10-CM

## 2022-06-07 MED ORDER — MUPIROCIN CALCIUM 2 % EX CREA: 1.0000 | TOPICAL_CREAM | Freq: Two times a day (BID) | CUTANEOUS | 3 refills | Status: DC

## 2022-06-07 NOTE — Telephone Encounter (Signed)
Can we call the pharmacy to see what they have?

## 2022-06-08 ENCOUNTER — Telehealth (INDEPENDENT_AMBULATORY_CARE_PROVIDER_SITE_OTHER): Payer: Self-pay

## 2022-06-08 MED ORDER — MUPIROCIN 2 % EX OINT: 1.0000 | TOPICAL_OINTMENT | Freq: Two times a day (BID) | CUTANEOUS | 3 refills | Status: AC

## 2022-06-08 NOTE — Telephone Encounter (Signed)
Patients daughter called about a prescription being filled but she was unable to get it.. She stated the pharmacy sent a message to the provider with the reason the medication couldn't be filled.   Per Vivia Birmingham- [facility]   [facility] patients insurance only covers mupirocin ointment and not cream.  Medication will be reorder and sent in.

## 2022-06-10 ENCOUNTER — Encounter (INDEPENDENT_AMBULATORY_CARE_PROVIDER_SITE_OTHER): Payer: Self-pay | Admitting: Nurse Practitioner

## 2022-06-10 NOTE — Progress Notes (Signed)
Subjective:    Patient ID: Melanie Juarez, female    DOB: 11-05-1945, 77 y.o.   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  The patient had a revision on 03/26/2022 and she has done well.  She denies any significant phantom limb symptoms.    Following stent removal she has had small areas of dehiscence but these are very small and shallow at this time.    Review of Systems  Musculoskeletal:  Positive for gait problem.  Skin:  Positive for wound.  All other systems reviewed and are negative.      Objective:   Physical Exam Vitals reviewed.  HENT:     Head: Normocephalic.  Cardiovascular:     Rate and Rhythm: Normal rate.  Pulmonary:     Effort: Pulmonary effort is normal.  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  Skin:    General: Skin is warm and dry.  Neurological:     Mental Status: She is alert and oriented to person, place, and time.     Gait: Gait normal.  Psychiatric:        Mood and Affect: Mood normal.        Behavior: Behavior normal.        Thought Content: Thought content normal.        Judgment: Judgment normal.     BP 134/75 (BP Location: Left Arm)   Pulse 88   Resp 16   Past Medical History:  Diagnosis Date   Diabetes ([facility])    Hypertension     Social History   Socioeconomic History   Marital status: Divorced    Spouse name: Not on file   Number of children: Not on file   Years of education: Not on file   Highest education level: Not on file  Occupational History   Not on file  Tobacco Use   Smoking status: Never   Smokeless tobacco: Never  Substance and Sexual Activity   Alcohol use: Never   Drug use: Never   Sexual activity: Not Currently  Other Topics  Concern   Not on file  Social History Narrative   Not on file   Social Determinants of Health   Financial Resource Strain: Not on file  Food Insecurity: No Food Insecurity (03/20/2022)   Hunger Vital Sign    Worried About Running Out of Food in the Last Year: Never true    Ran Out of Food in the Last Year: Never true  Transportation Needs: No Transportation Needs (03/20/2022)   PRAPARE - Administrator, Civil Service (Medical): No    Lack of Transportation (Non-Medical): No  Physical Activity: Not on file  Stress: Not on file  Social Connections: Not on file  Intimate Partner Violence: Not At Risk (03/20/2022)   Humiliation, Afraid, Rape, and Kick questionnaire    Fear of Current or Ex-Partner: No    Emotionally Abused: No    Physically Abused: No    Sexually Abused: No    Past Surgical History:  Procedure Laterality Date   AMPUTATION Right 03/22/2022   Procedure: AMPUTATION BELOW KNEE-Guillotine;  Surgeon: Annice Needy, MD;  Location: [facility];  Service: Vascular;  Laterality:  Right;   LOWER EXTREMITY ANGIOGRAPHY Right 03/21/2022   Procedure: Lower Extremity Angiography;  Surgeon: Annice Needy, MD;  Location: [facility];  Service: Cardiovascular;  Laterality: Right;   STUMP REVISION Right 03/26/2022   Procedure: BKA REVISION;  Surgeon: Annice Needy, MD;  Location: [facility];  Service: General;  Laterality: Right;    History reviewed. No pertinent family history.  Allergies  Allergen Reactions   Tilactase Diarrhea   Tomato Rash    Mouth breaks out       Latest Ref Rng & Units 03/28/2022    4:35 AM 03/27/2022    8:30 PM 03/27/2022    4:04 AM  CBC  WBC 4.0 - 10.5 K/uL 16.5   19.7   Hemoglobin 12.0 - 15.0 g/dL 7.4  7.7  6.6   Hematocrit 36.0 - 46.0 % 22.8  23.5  20.5   Platelets 150 - 400 K/uL 277   303       CMP     Component Value Date/Time   NA 132 (L) 03/28/2022 0435   K 3.9 03/28/2022 0435   CL 106 03/28/2022 0435   CO2 19 (L) 03/28/2022 0435    GLUCOSE 183 (H) 03/28/2022 0435   BUN 19 03/28/2022 0435   CREATININE 0.98 03/28/2022 0435   CALCIUM 7.6 (L) 03/28/2022 0435   ALBUMIN 1.7 (L) 03/24/2022 0532   GFRNONAA >60 03/28/2022 0435     No results found.     Assessment & Plan:   1. Hx of right BKA ([facility]) There is some slight dehiscence in several small areas.  We will begin to utilize Aquacel on the areas.  Will plan on having her return in 2 weeks to reevaluate the areas.   Current Outpatient Medications on File Prior to Visit  Medication Sig Dispense Refill   atenolol (TENORMIN) 50 MG tablet Take 50 mg by mouth daily.     atorvastatin (LIPITOR) 40 MG tablet Take 1 tablet (40 mg total) by mouth daily.     doxycycline (VIBRAMYCIN) 100 MG capsule Take 1 capsule (100 mg total) by mouth 2 (two) times daily. (Patient not taking: Reported on 06/07/2022) 20 capsule 0   Ensure Max Protein (ENSURE MAX PROTEIN) LIQD Take 330 mLs (11 oz total) by mouth 2 (two) times daily.     Fe Fum-Vit C-Vit B12-FA (TRIGELS-F FORTE) CAPS capsule Take 1 capsule by mouth 2 (two) times daily. (Patient not taking: Reported on 06/07/2022)  0   glipiZIDE (GLUCOTROL) 5 MG tablet Take 0.5 tablets (2.5 mg total) by mouth 2 (two) times daily before a meal.     metFORMIN (GLUCOPHAGE) 500 MG tablet Take 500 mg by mouth 2 (two) times daily.     oxyCODONE-acetaminophen (PERCOCET/ROXICET) 5-325 MG tablet Take 1 tablet by mouth every 4 (four) hours as needed for severe pain. (Patient not taking: Reported on 06/07/2022) 30 tablet 0   polyethylene glycol (MIRALAX / GLYCOLAX) 17 g packet Take 17 g by mouth daily as needed for moderate constipation. (Patient not taking: Reported on 06/07/2022) 14 each 0   senna-docusate (SENOKOT-S) 8.6-50 MG tablet Take 1 tablet by mouth at bedtime. (Patient not taking: Reported on 06/07/2022)     diphenhydrAMINE (BENADRYL) 25 MG tablet Take 0.5 tablets (12.5 mg total) by mouth every 6 (six) hours as needed for up to 5 days for itching. 10  tablet 0   No current facility-administered medications on file prior to visit.    There are no Patient Instructions on  file for this visit. No follow-ups on file.   Kris Hartmann, NP

## 2022-06-13 ENCOUNTER — Telehealth (INDEPENDENT_AMBULATORY_CARE_PROVIDER_SITE_OTHER): Payer: Self-pay

## 2022-06-13 NOTE — Telephone Encounter (Signed)
Jasmine December from [facility] called asking about a referral for the patients Prosthetic . Pt was seen 06/07/22. Jasmine December was wanting a date so she could start a timeline for the patient regarding physical therapy.   Per Vivia Birmingham, [facility] will [facility] as soon as they can. Also, there will be a process as far as ordering and fitting, ect, before the patient will start PT.

## 2022-06-18 ENCOUNTER — Encounter (INDEPENDENT_AMBULATORY_CARE_PROVIDER_SITE_OTHER): Payer: Self-pay | Admitting: Nurse Practitioner

## 2022-06-18 NOTE — Progress Notes (Signed)
Subjective:    Patient ID: Melanie Juarez, female    DOB: 11-21-1945, 77 y.o.   MRN: [id number] Chief Complaint  Patient presents with   Follow-up    [name] returns today for evaluation for right below-knee amputation site.  Today the wound is essentially healed.  There is just a very small area which is very shallow and superficial and should heal with no significant issue.  The patient has been doing well with transferring.  She is very optimistic and eager in order to begin the prosthetic process.    Review of Systems  Neurological:  Positive for weakness.  All other systems reviewed and are negative.      Objective:   Physical Exam Vitals reviewed.  HENT:     Head: Normocephalic.  Cardiovascular:     Rate and Rhythm: Normal rate.  Pulmonary:     Effort: Pulmonary effort is normal.  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  Skin:    General: Skin is warm and dry.  Neurological:     Mental Status: She is alert and oriented to person, place, and time.  Psychiatric:        Mood and Affect: Mood normal.        Behavior: Behavior normal.        Thought Content: Thought content normal.        Judgment: Judgment normal.     BP (!) 174/85 (BP Location: Right Arm)   Pulse 82   Resp 17   Past Medical History:  Diagnosis Date   Diabetes (HCC)    Hypertension     Social History   Socioeconomic History   Marital status: Divorced    Spouse name: Not on file   Number of children: Not on file   Years of education: Not on file   Highest education level: Not on file  Occupational History   Not on file  Tobacco Use   Smoking status: Never   Smokeless tobacco: Never  Substance and Sexual Activity   Alcohol use: Never   Drug use: Never   Sexual activity: Not Currently  Other Topics Concern   Not on file  Social History Narrative   Not on file   Social Determinants of Health   Financial Resource Strain: Not on file   Food Insecurity: No Food Insecurity (03/20/2022)   Hunger Vital Sign    Worried About Running Out of Food in the Last Year: Never true    Ran Out of Food in the Last Year: Never true  Transportation Needs: No Transportation Needs (03/20/2022)   PRAPARE - Administrator, Civil Service (Medical): No    Lack of Transportation (Non-Medical): No  Physical Activity: Not on file  Stress: Not on file  Social Connections: Not on file  Intimate Partner Violence: Not At Risk (03/20/2022)   Humiliation, Afraid, Rape, and Kick questionnaire    Fear of Current or Ex-Partner: No    Emotionally Abused: No    Physically Abused: No    Sexually Abused: No    Past Surgical History:  Procedure Laterality Date   AMPUTATION Right 03/22/2022   Procedure: AMPUTATION BELOW KNEE-Guillotine;  Surgeon: Annice Needy, MD;  Location: [facility] ORS;  Service: Vascular;  Laterality: Right;   LOWER EXTREMITY ANGIOGRAPHY Right 03/21/2022   Procedure: Lower Extremity Angiography;  Surgeon: Annice Needy, MD;  Location: [facility] INVASIVE CV [facility];  Service: Cardiovascular;  Laterality:  Right;   STUMP REVISION Right 03/26/2022   Procedure: BKA REVISION;  Surgeon: Annice Needy, MD;  Location: [facility] ORS;  Service: General;  Laterality: Right;    History reviewed. No pertinent family history.  Allergies  Allergen Reactions   Tilactase Diarrhea   Tomato Rash    Mouth breaks out       Latest Ref Rng & Units 03/28/2022    4:35 AM 03/27/2022    8:30 PM 03/27/2022    4:04 AM  CBC  WBC 4.0 - 10.5 K/uL 16.5   19.7   Hemoglobin 12.0 - 15.0 g/dL 7.4  7.7  6.6   Hematocrit 36.0 - 46.0 % 22.8  23.5  20.5   Platelets 150 - 400 K/uL 277   303       CMP     Component Value Date/Time   NA 132 (L) 03/28/2022 0435   K 3.9 03/28/2022 0435   CL 106 03/28/2022 0435   CO2 19 (L) 03/28/2022 0435   GLUCOSE 183 (H) 03/28/2022 0435   BUN 19 03/28/2022 0435   CREATININE 0.98 03/28/2022 0435   CALCIUM 7.6 (L) 03/28/2022 0435   ALBUMIN  1.7 (L) 03/24/2022 0532   GFRNONAA >60 03/28/2022 0435     No results found.     Assessment & Plan:   1. Hx of right BKA (HCC) [name] was seen for further evaluation for right below knee prosthesis.She Is a 77 year old woman who had amputation on 03/26/2022.  At the time he is well healed and ready for fitting of new right knee prosthesis.  She is a highly motivated individual and should do well once fitted with prosthesis.  She has no problem returning to a K2 ambulator, which will allow him to walk inside and outside of her home and overload level barriers.   Current Outpatient Medications on File Prior to Visit  Medication Sig Dispense Refill   acetaminophen (TYLENOL) 500 MG tablet Take 500 mg by mouth every 6 (six) hours as needed.     ascorbic acid (VITAMIN C) 500 MG tablet Take 500 mg by mouth daily.     atenolol (TENORMIN) 50 MG tablet Take 50 mg by mouth daily.     atorvastatin (LIPITOR) 40 MG tablet Take 1 tablet (40 mg total) by mouth daily.     Ensure Max Protein (ENSURE MAX PROTEIN) LIQD Take 330 mLs (11 oz total) by mouth 2 (two) times daily.     ferrous sulfate 325 (65 FE) MG EC tablet Take by mouth.     glipiZIDE (GLUCOTROL) 5 MG tablet Take 0.5 tablets (2.5 mg total) by mouth 2 (two) times daily before a meal.     melatonin (MELATONIN MAXIMUM STRENGTH) 5 MG TABS Take by mouth at bedtime as needed.     metFORMIN (GLUCOPHAGE) 500 MG tablet Take 500 mg by mouth 2 (two) times daily.     ONETOUCH ULTRA test strip USE 1 STRIP DAILY AS DIRECTED     diphenhydrAMINE (BENADRYL) 25 MG tablet Take 0.5 tablets (12.5 mg total) by mouth every 6 (six) hours as needed for up to 5 days for itching. 10 tablet 0   doxycycline (VIBRAMYCIN) 100 MG capsule Take 1 capsule (100 mg total) by mouth 2 (two) times daily. (Patient not taking: Reported on 06/07/2022) 20 capsule 0   Fe Fum-Vit C-Vit B12-FA (TRIGELS-F FORTE) CAPS capsule Take 1 capsule by mouth 2 (two) times daily. (Patient not taking:  Reported on 06/07/2022)  0  oxyCODONE-acetaminophen (PERCOCET/ROXICET) 5-325 MG tablet Take 1 tablet by mouth every 4 (four) hours as needed for severe pain. (Patient not taking: Reported on 06/07/2022) 30 tablet 0   polyethylene glycol (MIRALAX / GLYCOLAX) 17 g packet Take 17 g by mouth daily as needed for moderate constipation. (Patient not taking: Reported on 06/07/2022) 14 each 0   senna-docusate (SENOKOT-S) 8.6-50 MG tablet Take 1 tablet by mouth at bedtime. (Patient not taking: Reported on 06/07/2022)     No current facility-administered medications on file prior to visit.    There are no Patient Instructions on file for this visit. No follow-ups on file.   Georgiana Spinner, NP

## 2022-06-19 ENCOUNTER — Telehealth (INDEPENDENT_AMBULATORY_CARE_PROVIDER_SITE_OTHER): Payer: Self-pay

## 2022-06-19 NOTE — Telephone Encounter (Signed)
Rx given to April to fax

## 2022-06-19 NOTE — Telephone Encounter (Signed)
Made Pt's daughter aware that Rx was faxed

## 2022-07-08 ENCOUNTER — Emergency Department: Payer: Medicare Other

## 2022-07-08 ENCOUNTER — Other Ambulatory Visit: Payer: Self-pay

## 2022-07-08 ENCOUNTER — Emergency Department
Admission: EM | Admit: 2022-07-08 | Discharge: 2022-07-08 | Disposition: A | Discharge status: Home / Self Care | Payer: Medicare Other | Attending: Emergency Medicine | Admitting: Emergency Medicine

## 2022-07-08 DIAGNOSIS — I10 Essential (primary) hypertension: Secondary | ICD-10-CM | POA: Insufficient documentation

## 2022-07-08 DIAGNOSIS — E1165 Type 2 diabetes mellitus with hyperglycemia: Secondary | ICD-10-CM | POA: Insufficient documentation

## 2022-07-08 DIAGNOSIS — H5500 Unspecified nystagmus: Secondary | ICD-10-CM | POA: Diagnosis not present

## 2022-07-08 DIAGNOSIS — Y900 Blood alcohol level of less than 20 mg/100 ml: Secondary | ICD-10-CM | POA: Insufficient documentation

## 2022-07-08 DIAGNOSIS — R42 Dizziness and giddiness: Secondary | ICD-10-CM | POA: Diagnosis not present

## 2022-07-08 LAB — URINALYSIS, ROUTINE W REFLEX MICROSCOPIC
Bilirubin Urine: NEGATIVE
Glucose, UA: NEGATIVE mg/dL
Ketones, ur: 5 mg/dL — AB
Nitrite: NEGATIVE
Protein, ur: 100 mg/dL — AB
Specific Gravity, Urine: 1.006 (ref 1.005–1.030)
Squamous Epithelial / HPF: NONE SEEN /HPF (ref 0–5)
pH: 7 (ref 5.0–8.0)

## 2022-07-08 LAB — CBC
HCT: 34.8 % — ABNORMAL LOW (ref 36.0–46.0)
Hemoglobin: 11.1 g/dL — ABNORMAL LOW (ref 12.0–15.0)
MCH: 27.3 pg (ref 26.0–34.0)
MCHC: 31.9 g/dL (ref 30.0–36.0)
MCV: 85.7 fL (ref 80.0–100.0)
Platelets: 223 10*3/uL (ref 150–400)
RBC: 4.06 MIL/uL (ref 3.87–5.11)
RDW: 13.4 % (ref 11.5–15.5)
WBC: 10.1 10*3/uL (ref 4.0–10.5)
nRBC: 0 % (ref 0.0–0.2)

## 2022-07-08 LAB — COMPREHENSIVE METABOLIC PANEL
ALT: 25 U/L (ref 0–44)
AST: 24 U/L (ref 15–41)
Albumin: 3.9 g/dL (ref 3.5–5.0)
Alkaline Phosphatase: 61 U/L (ref 38–126)
Anion gap: 7 (ref 5–15)
BUN: 19 mg/dL (ref 8–23)
CO2: 27 mmol/L (ref 22–32)
Calcium: 9.4 mg/dL (ref 8.9–10.3)
Chloride: 99 mmol/L (ref 98–111)
Creatinine, Ser: 0.71 mg/dL (ref 0.44–1.00)
GFR, Estimated: >60 mL/min (ref >60)
Glucose, Bld: 212 mg/dL — ABNORMAL HIGH (ref 70–99)
Potassium: 4.6 mmol/L (ref 3.5–5.1)
Sodium: 133 mmol/L — ABNORMAL LOW (ref 135–145)
Total Bilirubin: 1.2 mg/dL (ref 0.3–1.2)
Total Protein: 8 g/dL (ref 6.5–8.1)

## 2022-07-08 LAB — DIFFERENTIAL
Abs Immature Granulocytes: 0.03 10*3/uL (ref 0.00–0.07)
Basophils Absolute: 0 10*3/uL (ref 0.0–0.1)
Basophils Relative: 0 %
Eosinophils Absolute: 0.3 10*3/uL (ref 0.0–0.5)
Eosinophils Relative: 3 %
Immature Granulocytes: 0 %
Lymphocytes Relative: 16 %
Lymphs Abs: 1.6 10*3/uL (ref 0.7–4.0)
Monocytes Absolute: 0.6 10*3/uL (ref 0.1–1.0)
Monocytes Relative: 6 %
Neutro Abs: 7.5 10*3/uL (ref 1.7–7.7)
Neutrophils Relative %: 75 %

## 2022-07-08 LAB — PROTIME-INR
INR: 1.2 (ref 0.8–1.2)
Prothrombin Time: 15.2 seconds (ref 11.4–15.2)

## 2022-07-08 LAB — ETHANOL: Alcohol, Ethyl (B): <10 mg/dL (ref <10)

## 2022-07-08 LAB — APTT: aPTT: 42 seconds — ABNORMAL HIGH (ref 24–36)

## 2022-07-08 LAB — CBG MONITORING, ED: Glucose-Capillary: 212 mg/dL — ABNORMAL HIGH (ref 70–99)

## 2022-07-08 MED ORDER — MECLIZINE HCL 25 MG PO TABS: 25.0000 mg | ORAL_TABLET | Freq: Three times a day (TID) | ORAL | 0 refills | Status: DC | PRN

## 2022-07-08 MED ORDER — SODIUM CHLORIDE 0.9% FLUSH
3.0000 mL | Freq: Once | INTRAVENOUS | Status: AC
  Administered 2022-07-08: 3 mL via INTRAVENOUS

## 2022-07-08 MED ORDER — ONDANSETRON 4 MG PO TBDP: 4.0000 mg | ORAL_TABLET | Freq: Three times a day (TID) | ORAL | 0 refills | Status: AC | PRN

## 2022-07-08 NOTE — ED Notes (Signed)
Transfer back to w/c and to hallway stretcher w/o difficulty. Alert, NAD, calm, interactive, speech clear.

## 2022-07-08 NOTE — ED Notes (Signed)
EDP at [facility] speaking with pt, daughter present.

## 2022-07-08 NOTE — ED Notes (Signed)
Pt in CT. EDP Dr. Cyril Loosen speaking with family.

## 2022-07-08 NOTE — ED Triage Notes (Signed)
Pt to ED from home via [facility]. Pt complaint is nausea and dizziness. When pt moves her head she has dizziness and nausea.  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 Pt woke up normal and ate breakfast and then began dizzy. Pt states my right ear is clogged.  218BGL IV 20GLFA 118/78   LKW 7 am daughter talked to her on the phone at 1145 AM and and was dizzy at that time.

## 2022-07-08 NOTE — ED Provider Notes (Signed)
[facility] Provider Note    Event Date/Time   First MD Initiated Contact with Patient 07/08/22 1528     (approximate)   History   Dizziness (Since 1145AM)   HPI  Melanie Juarez is a 77 y.o. female with a history of diabetes, hypertension, right BKA who presents with complaints of dizziness.  Patient reports she was lying in bed talking to a friend and when she turned her head all of a sudden felt like the room was spinning.  This improved when she held her head very still but worsens when she moves it again.  She denies headache.  She did get nauseated from this as well.  This occurred at approximately 11:45 AM.     Physical Exam   Triage Vital Signs: ED Triage Vitals [07/08/22 1516]  Enc Vitals Group     BP (!) 177/78     Pulse Rate 89     Resp 16     Temp 98 F (36.7 C)     Temp Source Oral     SpO2 92 %     Weight 85 kg (187 lb 6.3 oz)     Height 1.702 m (5\' 7" )     Head Circumference      Peak Flow      Pain Score 0     Pain Loc      Pain Edu?      Excl. in GC?     Most recent vital signs: Vitals:   07/08/22 1516  BP: (!) 177/78  Pulse: 89  Resp: 16  Temp: 98 F (36.7 C)  SpO2: 92%     General: Awake, no distress.  Patient reports she is feeling better CV:  Good peripheral perfusion.  Resp:  Normal effort.  Abd:  No distention.  Other:  Cranial nerves II through XII are normal.  Mild right horizontal nystagmus, normal strength in all extremities, PERRLA, EOMI Right BKA   ED Results / Procedures / Treatments   Labs (all labs ordered are listed, but only abnormal results are displayed) Labs Reviewed  APTT - Abnormal; Notable for the following components:      Result Value   aPTT 42 (*)    All other components within normal limits  CBC - Abnormal; Notable for the following components:   Hemoglobin 11.1 (*)    HCT 34.8 (*)    All other components within normal limits  COMPREHENSIVE METABOLIC PANEL - Abnormal; Notable  for the following components:   Sodium 133 (*)    Glucose, Bld 212 (*)    All other components within normal limits  URINALYSIS, ROUTINE W REFLEX MICROSCOPIC - Abnormal; Notable for the following components:   Color, Urine YELLOW (*)    APPearance CLEAR (*)    Hgb urine dipstick SMALL (*)    Ketones, ur 5 (*)    Protein, ur 100 (*)    Leukocytes,Ua TRACE (*)    Bacteria, UA RARE (*)    All other components within normal limits  CBG MONITORING, ED - Abnormal; Notable for the following components:   Glucose-Capillary 212 (*)    All other components within normal limits  PROTIME-INR  DIFFERENTIAL  ETHANOL     EKG  ED ECG REPORT I, Jene Every, the attending physician, personally viewed and interpreted this ECG.  Date: 07/08/2022  Rhythm: normal sinus rhythm QRS Axis: normal Intervals: normal ST/T Wave abnormalities: normal Narrative Interpretation: no evidence of acute ischemia  RADIOLOGY CT head viewed interpreted by me, no acute abnormalities noted, pending radiology review   PROCEDURES:  Critical Care performed:   Procedures   MEDICATIONS ORDERED IN ED: Medications  sodium chloride flush (NS) 0.9 % injection 3 mL (3 mLs Intravenous Given 07/08/22 1601)     IMPRESSION / MDM / ASSESSMENT AND PLAN / ED COURSE  I reviewed the triage vital signs and the nursing notes. Patient's presentation is most consistent with acute presentation with potential threat to life or bodily function.  Patient presents with dizziness as described above.  Most consistent with benign positional vertigo, less likely CVA, not consistent with near syncope  Lab work reviewed and is overall reassuring, mild hyponatremia improved from prior level in February.  CT head is reassuring.  Patient is asymptomatic here in the emergency department.  Discussed with patient and family, no indication for admission at this time given asymptomatic, reassuring workup, plan for discharge, return  precautions discussed.      FINAL CLINICAL IMPRESSION(S) / ED DIAGNOSES   Final diagnoses:  Vertigo     Rx / DC Orders   ED Discharge Orders          Ordered    meclizine (ANTIVERT) 25 MG tablet  3 times daily PRN        07/08/22 1629    ondansetron (ZOFRAN-ODT) 4 MG disintegrating tablet  Every 8 hours PRN        07/08/22 1629             Note:  This document was prepared using Dragon voice recognition software and may include unintentional dictation errors.   Jene Every, MD 07/08/22 [phone]

## 2022-07-08 NOTE — ED Notes (Signed)
Back from CT via w/c, alert, NAD, calm, interactive, resps e/u. Into b/r to void. Transferred from w/c to commode w/o difficulty. Daughter at [facility].

## 2022-07-08 NOTE — ED Notes (Signed)
Alert, NAD, calm, interactive, out in w/c to car, family x2  present, denies questions or needs, VSS.

## 2022-07-31 ENCOUNTER — Ambulatory Visit (INDEPENDENT_AMBULATORY_CARE_PROVIDER_SITE_OTHER): Payer: Medicare Other | Admitting: Vascular Surgery

## 2022-08-07 ENCOUNTER — Ambulatory Visit (INDEPENDENT_AMBULATORY_CARE_PROVIDER_SITE_OTHER): Payer: Medicare Other | Admitting: Vascular Surgery

## 2022-08-07 VITALS — BP 166/88 | HR 93 | Resp 18

## 2022-08-07 DIAGNOSIS — L97509 Non-pressure chronic ulcer of other part of unspecified foot with unspecified severity: Secondary | ICD-10-CM

## 2022-08-07 DIAGNOSIS — I10 Essential (primary) hypertension: Secondary | ICD-10-CM | POA: Diagnosis not present

## 2022-08-07 DIAGNOSIS — Z89511 Acquired absence of right leg below knee: Secondary | ICD-10-CM

## 2022-08-07 DIAGNOSIS — I739 Peripheral vascular disease, unspecified: Secondary | ICD-10-CM | POA: Diagnosis not present

## 2022-08-07 DIAGNOSIS — E11621 Type 2 diabetes mellitus with foot ulcer: Secondary | ICD-10-CM

## 2022-08-07 NOTE — Progress Notes (Signed)
MRN : [id number]  Melanie Juarez is a 77 y.o. (1945/06/21) female who presents with chief complaint of  Chief Complaint  Patient presents with   Venous Insufficiency  .  History of Present Illness: Patient returns today in follow up of her right BKA.  It is healed.  She is in the process of getting her prosthesis now.  She is doing well.  No pain.  No left leg ulceration or rest pain.    Current Outpatient Medications  Medication Sig Dispense Refill   acetaminophen (TYLENOL) 500 MG tablet Take 500 mg by mouth every 6 (six) hours as needed.     ascorbic acid (VITAMIN C) 500 MG tablet Take 500 mg by mouth daily.     atenolol (TENORMIN) 50 MG tablet Take 50 mg by mouth daily.     atorvastatin (LIPITOR) 40 MG tablet Take 1 tablet (40 mg total) by mouth daily.     Ensure Max Protein (ENSURE MAX PROTEIN) LIQD Take 330 mLs (11 oz total) by mouth 2 (two) times daily.     ferrous sulfate 325 (65 FE) MG EC tablet Take by mouth.     glipiZIDE (GLUCOTROL) 5 MG tablet Take 0.5 tablets (2.5 mg total) by mouth 2 (two) times daily before a meal.     melatonin (MELATONIN MAXIMUM STRENGTH) 5 MG TABS Take by mouth at bedtime as needed.     metFORMIN (GLUCOPHAGE) 500 MG tablet Take 500 mg by mouth 2 (two) times daily.     mupirocin ointment (BACTROBAN) 2 % Apply 1 Application topically 2 (two) times daily. 22 g 3   ondansetron (ZOFRAN-ODT) 4 MG disintegrating tablet Take 1 tablet (4 mg total) by mouth every 8 (eight) hours as needed for nausea or vomiting. 20 tablet 0   ONETOUCH ULTRA test strip USE 1 STRIP DAILY AS DIRECTED     diphenhydrAMINE (BENADRYL) 25 MG tablet Take 0.5 tablets (12.5 mg total) by mouth every 6 (six) hours as needed for up to 5 days for itching. 10 tablet 0   No current facility-administered medications for this visit.    Past Medical History:  Diagnosis Date   Diabetes (HCC)    Hypertension     Past Surgical History:  Procedure Laterality Date   AMPUTATION Right 03/22/2022    Procedure: AMPUTATION BELOW KNEE-Guillotine;  Surgeon: Annice Needy, MD;  Location: [facility] ORS;  Service: Vascular;  Laterality: Right;   LOWER EXTREMITY ANGIOGRAPHY Right 03/21/2022   Procedure: Lower Extremity Angiography;  Surgeon: Annice Needy, MD;  Location: [facility] INVASIVE CV [facility];  Service: Cardiovascular;  Laterality: Right;   STUMP REVISION Right 03/26/2022   Procedure: BKA REVISION;  Surgeon: Annice Needy, MD;  Location: [facility] ORS;  Service: General;  Laterality: Right;     Social History   Tobacco Use   Smoking status: Never   Smokeless tobacco: Never  Substance Use Topics   Alcohol use: Never   Drug use: Never       No family history on file.   Allergies  Allergen Reactions   Tilactase Diarrhea   Tomato Rash    Mouth breaks out     REVIEW OF SYSTEMS (Negative unless checked)  Constitutional: [] Weight loss  [] Fever  [] Chills Cardiac: [] Chest pain   [] Chest pressure   [] Palpitations   [] Shortness of breath when laying flat   [] Shortness of breath at rest   [] Shortness of breath with exertion. Vascular:  [] Pain in legs with walking   [] Pain in  legs at rest   [] Pain in legs when laying flat   [] Claudication   [] Pain in feet when walking  [] Pain in feet at rest  [] Pain in feet when laying flat   [] History of DVT   [] Phlebitis   [] Swelling in legs   [] Varicose veins   [] Non-healing ulcers Pulmonary:   [] Uses home oxygen   [] Productive cough   [] Hemoptysis   [] Wheeze  [] COPD   [] Asthma Neurologic:  [] Dizziness  [] Blackouts   [] Seizures   [] History of stroke   [] History of TIA  [] Aphasia   [] Temporary blindness   [] Dysphagia   [] Weakness or numbness in arms   [] Weakness or numbness in legs Musculoskeletal:  [x] Arthritis   [] Joint swelling   [x] Joint pain   [] Low back pain Hematologic:  [] Easy bruising  [] Easy bleeding   [] Hypercoagulable state   [] Anemic   Gastrointestinal:  [] Blood in stool   [] Vomiting blood  [x] Gastroesophageal reflux/heartburn   [] Abdominal pain Genitourinary:   [] Chronic kidney disease   [] Difficult urination  [] Frequent urination  [] Burning with urination   [] Hematuria Skin:  [] Rashes   [] Ulcers   [] Wounds Psychological:  [] History of anxiety   []  History of major depression.  Physical Examination  BP (!) 166/88 (BP Location: Right Arm)   Pulse 93   Resp 18  Gen:  WD/WN, NAD Head: [place]/AT, No temporalis wasting. Ear/Nose/Throat: Hearing grossly intact, nares w/o erythema or drainage Eyes: Conjunctiva clear. Sclera non-icteric Neck: Supple.  Trachea midline Pulmonary:  Good air movement, no use of accessory muscles.  Cardiac: RRR, no JVD Vascular:  Vessel Right Left  Radial Palpable Palpable                          PT Not Palpable 1+ Palpable  DP Not Palpable 1+ Palpable   Gastrointestinal: soft, non-tender/non-distended. No guarding/reflex.  Musculoskeletal: M/S 5/5 throughout.  Right BKA well healed. No LLE edema. Neurologic: Sensation grossly intact in extremities.  Symmetrical.  Speech is fluent.  Psychiatric: Judgment intact, Mood & affect appropriate for pt's clinical situation. Dermatologic: No rashes or ulcers noted.  No cellulitis or open wounds.      Labs Recent Results (from the past 2160 hour(s))  Protime-INR     Status: None   Collection Time: 07/08/22  3:24 PM  Result Value Ref Range   Prothrombin Time 15.2 11.4 - 15.2 seconds   INR 1.2 0.8 - 1.2    Comment: (NOTE) INR goal varies based on device and disease states. Performed at [facility], [address] [place], [place] [address]   APTT     Status: Abnormal   Collection Time: 07/08/22  3:24 PM  Result Value Ref Range   aPTT 42 (H) 24 - 36 seconds    Comment:        IF BASELINE aPTT IS ELEVATED, SUGGEST PATIENT RISK ASSESSMENT BE USED TO DETERMINE APPROPRIATE ANTICOAGULANT THERAPY. Performed at [facility], [address] [place], [place] [address]   CBC     Status: Abnormal   Collection Time: 07/08/22  3:24 PM  Result  Value Ref Range   WBC 10.1 4.0 - 10.5 K/uL   RBC 4.06 3.87 - 5.11 MIL/uL   Hemoglobin 11.1 (L) 12.0 - 15.0 g/dL   HCT 09.8 (L) 11.9 - 14.7 %   MCV 85.7 80.0 - 100.0 fL   MCH 27.3 26.0 - 34.0 pg   MCHC 31.9 30.0 - 36.0 g/dL   RDW  13.4 11.5 - 15.5 %   Platelets 223 150 - 400 K/uL   nRBC 0.0 0.0 - 0.2 %    Comment: Performed at [facility], [address] [place], [place] [address]  Differential     Status: None   Collection Time: 07/08/22  3:24 PM  Result Value Ref Range   Neutrophils Relative % 75 %   Neutro Abs 7.5 1.7 - 7.7 K/uL   Lymphocytes Relative 16 %   Lymphs Abs 1.6 0.7 - 4.0 K/uL   Monocytes Relative 6 %   Monocytes Absolute 0.6 0.1 - 1.0 K/uL   Eosinophils Relative 3 %   Eosinophils Absolute 0.3 0.0 - 0.5 K/uL   Basophils Relative 0 %   Basophils Absolute 0.0 0.0 - 0.1 K/uL   Immature Granulocytes 0 %   Abs Immature Granulocytes 0.03 0.00 - 0.07 K/uL    Comment: Performed at [facility], [address] [place], [place] [address]  Comprehensive metabolic panel     Status: Abnormal   Collection Time: 07/08/22  3:24 PM  Result Value Ref Range   Sodium 133 (L) 135 - 145 mmol/L   Potassium 4.6 3.5 - 5.1 mmol/L   Chloride 99 98 - 111 mmol/L   CO2 27 22 - 32 mmol/L   Glucose, Bld 212 (H) 70 - 99 mg/dL    Comment: Glucose reference range applies only to samples taken after fasting for at least 8 hours.   BUN 19 8 - 23 mg/dL   Creatinine, Ser 2.53 0.44 - 1.00 mg/dL   Calcium 9.4 8.9 - 66.4 mg/dL   Total Protein 8.0 6.5 - 8.1 g/dL   Albumin 3.9 3.5 - 5.0 g/dL   AST 24 15 - 41 U/L   ALT 25 0 - 44 U/L   Alkaline Phosphatase 61 38 - 126 U/L   Total Bilirubin 1.2 0.3 - 1.2 mg/dL   GFR, Estimated >40 >34 mL/min    Comment: (NOTE) Calculated using the CKD-EPI Creatinine Equation (2021)    Anion gap 7 5 - 15    Comment: Performed at [facility], [address] [place], [place] [address]  Ethanol     Status: None   Collection Time:  07/08/22  3:24 PM  Result Value Ref Range   Alcohol, Ethyl (B) <10 <10 mg/dL    Comment: (NOTE) Lowest detectable limit for serum alcohol is 10 mg/dL.  For medical purposes only. Performed at [facility], [address] [place], [place] [address]   Urinalysis, Routine w reflex microscopic -Urine, Random     Status: Abnormal   Collection Time: 07/08/22  3:50 PM  Result Value Ref Range   Color, Urine YELLOW (A) YELLOW   APPearance CLEAR (A) CLEAR   Specific Gravity, Urine 1.006 1.005 - 1.030   pH 7.0 5.0 - 8.0   Glucose, UA NEGATIVE NEGATIVE mg/dL   Hgb urine dipstick SMALL (A) NEGATIVE   Bilirubin Urine NEGATIVE NEGATIVE   Ketones, ur 5 (A) NEGATIVE mg/dL   Protein, ur 564 (A) NEGATIVE mg/dL   Nitrite NEGATIVE NEGATIVE   Leukocytes,Ua TRACE (A) NEGATIVE   RBC / HPF 6-10 0 - 5 RBC/hpf   WBC, UA 6-10 0 - 5 WBC/hpf   Bacteria, UA RARE (A) NONE SEEN   Squamous Epithelial / HPF NONE SEEN 0 - 5 /HPF    Comment: Performed at [facility], [address], [place], [place] [address]  CBG monitoring, ED  Status: Abnormal   Collection Time: 07/08/22  4:00 PM  Result Value Ref Range   Glucose-Capillary 212 (H) 70 - 99 mg/dL    Comment: Glucose reference range applies only to samples taken after fasting for at least 8 hours.    Radiology No results found.  Assessment/Plan  PAD (peripheral artery disease) (HCC) Check left ABI on follow up visit in 3 months.   Type 2 diabetes mellitus (HCC) blood glucose control important in reducing the progression of atherosclerotic disease. Also, involved in wound healing. On appropriate medications.   Essential hypertension blood pressure control important in reducing the progression of atherosclerotic disease. On appropriate oral medications.   Hx of BKA, right (HCC) Healed.  In the process of getting her prosthesis.  A new prescription given for physical therapy for prosthesis and ambulatory training.    [name], MD  08/08/2022 10:42 AM    This note was created with Dragon medical transcription system.  Any errors from dictation are purely unintentional

## 2022-08-08 DIAGNOSIS — Z89511 Acquired absence of right leg below knee: Secondary | ICD-10-CM | POA: Insufficient documentation

## 2022-08-08 NOTE — Assessment & Plan Note (Signed)
Healed.  In the process of getting her prosthesis.  A new prescription given for physical therapy for prosthesis and ambulatory training.

## 2022-08-08 NOTE — Assessment & Plan Note (Signed)
blood pressure control important in reducing the progression of atherosclerotic disease. On appropriate oral medications.  

## 2022-08-08 NOTE — Assessment & Plan Note (Signed)
blood glucose control important in reducing the progression of atherosclerotic disease. Also, involved in wound healing. On appropriate medications.  

## 2022-08-08 NOTE — Assessment & Plan Note (Signed)
Check left ABI on follow up visit in 3 months.

## 2022-08-28 ENCOUNTER — Ambulatory Visit: Payer: Medicare Other | Attending: Vascular Surgery | Admitting: Physical Therapy

## 2022-08-28 ENCOUNTER — Encounter: Payer: Self-pay | Admitting: Physical Therapy

## 2022-08-28 DIAGNOSIS — R269 Unspecified abnormalities of gait and mobility: Secondary | ICD-10-CM | POA: Diagnosis present

## 2022-08-28 DIAGNOSIS — M6281 Muscle weakness (generalized): Secondary | ICD-10-CM | POA: Diagnosis present

## 2022-08-28 DIAGNOSIS — Z89511 Acquired absence of right leg below knee: Secondary | ICD-10-CM | POA: Insufficient documentation

## 2022-08-28 NOTE — Therapy (Signed)
OUTPATIENT PHYSICAL THERAPY LOWER EXTREMITY EVALUATION  Patient Name: Melanie Juarez MRN: [id number] DOB:1945/09/11, 77 y.o., female Today's Date: 08/28/2022  END OF SESSION:  PT End of Session - 08/28/22 0718     Visit Number 1    Number of Visits 12    Date for PT Re-Evaluation 10/09/22    PT Start Time 0719    PT Stop Time 0818    PT Time Calculation (min) 59 min             Past Medical History:  Diagnosis Date   Diabetes (HCC)    Hypertension    Past Surgical History:  Procedure Laterality Date   AMPUTATION Right 03/22/2022   Procedure: AMPUTATION BELOW KNEE-Guillotine;  Surgeon: Annice Needy, MD;  Location: [facility];  Service: Vascular;  Laterality: Right;   LOWER EXTREMITY ANGIOGRAPHY Right 03/21/2022   Procedure: Lower Extremity Angiography;  Surgeon: Annice Needy, MD;  Location: [facility];  Service: Cardiovascular;  Laterality: Right;   STUMP REVISION Right 03/26/2022   Procedure: BKA REVISION;  Surgeon: Annice Needy, MD;  Location: [facility];  Service: General;  Laterality: Right;   Patient Active Problem List   Diagnosis Date Noted   Hx of BKA, right (HCC) 08/08/2022   Lactic acidosis 03/21/2022   Obesity (BMI 30-39.9) 03/21/2022   PAD (peripheral artery disease) (HCC) 03/21/2022   Osteomyelitis (HCC) 03/19/2022   Diabetic infection of right foot (HCC) 03/19/2022   Essential hypertension 03/19/2022   Leukocytosis 03/19/2022   AKI (acute kidney injury) (HCC) 03/19/2022   Sepsis with acute renal failure without septic shock (HCC) 03/19/2022   Hyperlipidemia 05/17/2014   Type 2 diabetes mellitus (HCC) 05/17/2014   Anemia, unspecified 05/18/2013   Benign essential hypertension 05/18/2013   REFERRING PROVIDER: Annice Needy, MD  REFERRING DIAG: s/p R BKA  THERAPY DIAG:  Hx of BKA, right (HCC)  Muscle weakness (generalized)  Gait difficulty  Rationale for Evaluation and Treatment: Rehabilitation  ONSET DATE: 03/22/22  SUBJECTIVE:   SUBJECTIVE  STATEMENT: Pt. Had a sore on R foot resulting in R BKA on 03/22/22.  [name]  Pt. Wants to return to living independently.  Pt. Completing HEP from rehab center.  Pt. Has been unable to get into shower.    PERTINENT HISTORY: JAQUITA BESSIRE is a 77 y.o. (Dec 23, 1945) female who presents with chief complaint of     Chief Complaint  Patient presents with   Venous Insufficiency  .   History of Present Illness: Patient returns today in follow up of her right BKA.  It is healed.  She is in the process of getting her prosthesis now.  She is doing well.  No pain.  No left leg ulceration or rest pain.    Assessment/Plan   PAD (peripheral artery disease) (HCC) Check left ABI on follow up visit in 3 months.    Type 2 diabetes mellitus (HCC) blood glucose control important in reducing the progression of atherosclerotic disease. Also, involved in wound healing. On appropriate medications.    Essential hypertension blood pressure control important in reducing the progression of atherosclerotic disease. On appropriate oral medications.    Hx of BKA, right (HCC) Healed.  In the process of getting her prosthesis.  A new prescription given for physical therapy for prosthesis and ambulatory training.   [name], MD   08/08/2022 10:42 AM  PAIN:  Are you having pain? No.  Pt. Reports phantom limb symptoms but not pain.    PRECAUTIONS: Fall  RED FLAGS: None   WEIGHT BEARING RESTRICTIONS: No  FALLS:  Has patient fallen in last 6 months? No  LIVING ENVIRONMENT: Lives with: lives with their daughter Lives in: House/apartment Stairs: No Has following equipment at home: Environmental consultant - 2 wheeled, Wheelchair (manual), and Ramped entry  OCCUPATION: Retired  PLOF: Independent.  Pt. Does not drive.    PATIENT GOALS: Ambulate with least assistive device.  Return home independently/ go back to church.    NEXT MD VISIT: [name] 7/31.  MD f/u in  September?  OBJECTIVE:   PATIENT SURVEYS:  FOTO initial 29/ goal 38.    COGNITION: Overall cognitive status: Within functional limits for tasks assessed     SENSATION: WFL  EDEMA:  R residual limb swelling noted.    POSTURE: rounded shoulders and forward head  PALPATION: Minimal tenderness along R distal residual limb/ incision.    LOWER EXTREMITY ROM:  Active ROM Right eval Left eval  Hip flexion    Hip extension    Hip abduction    Hip adduction    Hip internal rotation    Hip external rotation    Knee flexion    Knee extension    Ankle dorsiflexion    Ankle plantarflexion    Ankle inversion    Ankle eversion     (Blank rows = not tested)  LOWER EXTREMITY MMT:  MMT Right eval Left eval  Hip flexion    Hip extension    Hip abduction    Hip adduction    Hip internal rotation    Hip external rotation    Knee flexion    Knee extension    Ankle dorsiflexion    Ankle plantarflexion    Ankle inversion    Ankle eversion     (Blank rows = not tested)  FUNCTIONAL TESTS:  5 times sit to stand: TBD Timed up and go (TUG): TBD 6 minute walk test: TBD  GAIT: Distance walked: 45 feet in gym with RW/ min. A for safety and cuing Assistive device utilized: Walker - 2 wheeled Level of assistance: Min A Comments: ***   TODAY'S TREATMENT:                                                                                                                              DATE: 08/28/22   Evaluation/ prosthetic and gait training in //-bars with mirror feedback for cuing/ safety.    PATIENT EDUCATION:  Education details: Discussed HEP/ standing hip ex. At [employer] Person educated: Patient and Child(ren) Education method: Medical illustrator Education comprehension: verbalized understanding and returned demonstration  HOME EXERCISE PROGRAM: Will issue handouts next tx. session  ASSESSMENT:  CLINICAL IMPRESSION: Patient is a pleasant 77 y.o.  female who was seen today for physical therapy evaluation and treatment for R BKA/ prosthetic and gait training.    OBJECTIVE IMPAIRMENTS:  Abnormal gait, decreased activity tolerance, decreased balance, decreased endurance, decreased mobility, difficulty walking, decreased ROM, decreased strength, decreased safety awareness, improper body mechanics, postural dysfunction, and pain.   ACTIVITY LIMITATIONS: carrying, lifting, standing, squatting, stairs, transfers, bed mobility, bathing, toileting, dressing, and locomotion level  PARTICIPATION LIMITATIONS: meal prep, cleaning, laundry, shopping, community activity, and church  PERSONAL FACTORS: Fitness and Past/current experiences are also affecting patient's functional outcome.   REHAB POTENTIAL: Good  CLINICAL DECISION MAKING: Evolving/moderate complexity  EVALUATION COMPLEXITY: Moderate   GOALS: Goals reviewed with patient? Yes  SHORT TERM GOALS: Target date: *** *** Baseline: Goal status: INITIAL   LONG TERM GOALS: Target date: ***  *** Baseline:  Goal status: INITIAL  2.  *** Baseline:  Goal status: INITIAL  3.  *** Baseline:  Goal status: INITIAL  4.  *** Baseline:  Goal status: INITIAL   PLAN:  PT FREQUENCY: 2x/week  PT DURATION: 6 weeks  PLANNED INTERVENTIONS: Therapeutic exercises, Therapeutic activity, Neuromuscular re-education, Balance training, Gait training, Patient/Family education, Self Care, Joint mobilization, Stair training, Prosthetic training, DME instructions, Manual therapy, and Re-evaluation  PLAN FOR NEXT SESSION: Gait training in //-bars/ ISSUE standing HEP  Cammie Mcgee, PT, DPT # [phone] 08/28/2022, 11:35 AM

## 2022-08-30 ENCOUNTER — Encounter: Payer: Self-pay | Admitting: Physical Therapy

## 2022-08-30 ENCOUNTER — Ambulatory Visit: Payer: Medicare Other

## 2022-08-30 ENCOUNTER — Ambulatory Visit: Payer: Medicare Other | Admitting: Physical Therapy

## 2022-08-30 DIAGNOSIS — Z89511 Acquired absence of right leg below knee: Secondary | ICD-10-CM

## 2022-08-30 DIAGNOSIS — R269 Unspecified abnormalities of gait and mobility: Secondary | ICD-10-CM

## 2022-08-30 DIAGNOSIS — M6281 Muscle weakness (generalized): Secondary | ICD-10-CM

## 2022-08-30 NOTE — Therapy (Signed)
OUTPATIENT PHYSICAL THERAPY LOWER EXTREMITY TREATMENT  Patient Name: Melanie Juarez MRN: [id number] DOB:February 05, 1946, 77 y.o., female Today's Date: 08/30/2022  END OF SESSION:  PT End of Session - 08/30/22 1254     Visit Number 2    Number of Visits 12    Date for PT Re-Evaluation 10/09/22    PT Start Time 1258    PT Stop Time 1347    PT Time Calculation (min) 49 min    Equipment Utilized During Treatment Gait belt    Activity Tolerance Patient tolerated treatment well    Behavior During Therapy WFL for tasks assessed/performed              Past Medical History:  Diagnosis Date   Diabetes (HCC)    Hypertension    Past Surgical History:  Procedure Laterality Date   AMPUTATION Right 03/22/2022   Procedure: AMPUTATION BELOW KNEE-Guillotine;  Surgeon: Annice Needy, MD;  Location: [facility];  Service: Vascular;  Laterality: Right;   LOWER EXTREMITY ANGIOGRAPHY Right 03/21/2022   Procedure: Lower Extremity Angiography;  Surgeon: Annice Needy, MD;  Location: [facility];  Service: Cardiovascular;  Laterality: Right;   STUMP REVISION Right 03/26/2022   Procedure: BKA REVISION;  Surgeon: Annice Needy, MD;  Location: [facility];  Service: General;  Laterality: Right;   Patient Active Problem List   Diagnosis Date Noted   Hx of BKA, right (HCC) 08/08/2022   Lactic acidosis 03/21/2022   Obesity (BMI 30-39.9) 03/21/2022   PAD (peripheral artery disease) (HCC) 03/21/2022   Osteomyelitis (HCC) 03/19/2022   Diabetic infection of right foot (HCC) 03/19/2022   Essential hypertension 03/19/2022   Leukocytosis 03/19/2022   AKI (acute kidney injury) (HCC) 03/19/2022   Sepsis with acute renal failure without septic shock (HCC) 03/19/2022   Hyperlipidemia 05/17/2014   Type 2 diabetes mellitus (HCC) 05/17/2014   Anemia, unspecified 05/18/2013   Benign essential hypertension 05/18/2013   REFERRING PROVIDER: Annice Needy, MD  REFERRING DIAG: s/p R BKA  THERAPY DIAG:  Hx of BKA, right  (HCC)  Muscle weakness (generalized)  Gait difficulty  Rationale for Evaluation and Treatment: Rehabilitation  ONSET DATE: 03/22/22  SUBJECTIVE:   SUBJECTIVE STATEMENT: Pt. Had a sore on R foot resulting in R BKA on 03/22/22.  Pt. Entered Pt with use of w/c and daughter Melanie Juarez) present.  Pt. Wants to return to living independently.  Pt. Completing HEP from rehab center.  Pt. Has been unable to get into shower.    PERTINENT HISTORY: Melanie Juarez is a 77 y.o. (Jun 07, 1945) female who presents with chief complaint of     Chief Complaint  Patient presents with   Venous Insufficiency  .   History of Present Illness: Patient returns today in follow up of her right BKA.  It is healed.  She is in the process of getting her prosthesis now.  She is doing well.  No pain.  No left leg ulceration or rest pain.    Assessment/Plan   PAD (peripheral artery disease) (HCC) Check left ABI on follow up visit in 3 months.    Type 2 diabetes mellitus (HCC) blood glucose control important in reducing the progression of atherosclerotic disease. Also, involved in wound healing. On appropriate medications.    Essential hypertension blood pressure control important in reducing the progression of atherosclerotic disease. On appropriate oral medications.    Hx of BKA, right (HCC) Healed.  In the process of getting her prosthesis.  A new  prescription given for physical therapy for prosthesis and ambulatory training.   [name], MD   08/08/2022 10:42 AM  PAIN:  Are you having pain? No.  Pt. Reports phantom limb symptoms but not pain.    PRECAUTIONS: Fall  RED FLAGS: None   WEIGHT BEARING RESTRICTIONS: No  FALLS:  Has patient fallen in last 6 months? No  LIVING ENVIRONMENT: Lives with: lives with their daughter  Melanie Juarez).   Lives in: House/apartment Stairs: No Has following equipment at home: Dan Humphreys - 2 wheeled, Wheelchair (manual), and Ramped entry  OCCUPATION: Retired  PLOF:  Independent.  Pt. Does not drive.    PATIENT GOALS: Ambulate with least assistive device.  Return home independently/ go back to church.    NEXT MD VISIT: Melanie Juarez 7/31.  MD f/u in September?  OBJECTIVE:   PATIENT SURVEYS:  FOTO initial 29/ goal 28.    COGNITION: Overall cognitive status: Within functional limits for tasks assessed     SENSATION: WFL  EDEMA:  No R residual limb swelling noted.  PT discussed use of shrinker and proper use of ply socks for improved fit of prosthetic leg.    POSTURE: rounded shoulders and forward head  PALPATION: Minimal tenderness along R distal residual limb/ incision.  Good incision healing/ PT discussed benefits of scar massage.    LOWER EXTREMITY ROM:  Active ROM Right eval Left eval  Hip flexion [facility] WFL  Hip extension NT NT  Hip abduction [facility]  Hip adduction    Hip internal rotation    Hip external rotation    Knee flexion [facility] WFL  Knee extension -3 deg. 0 deg.  Ankle dorsiflexion    Ankle plantarflexion    Ankle inversion    Ankle eversion     (Blank rows = not tested)  LOWER EXTREMITY MMT:  MMT Right eval Left eval  Hip flexion 4/5 4/5  Hip extension    Hip abduction 3+/5 4/5  Hip adduction    Hip internal rotation    Hip external rotation    Knee flexion 4+/5  4+/5  Knee extension 4+/5 5/5  Ankle dorsiflexion    Ankle plantarflexion    Ankle inversion    Ankle eversion     (Blank rows = not tested)   B UE AROM WFL.  B UE strength grossly 4/5 MMT except bicep/tricep 4+/5 MMT  FUNCTIONAL TESTS:  5 times sit to stand: TBD Timed up and go (TUG): TBD 6 minute walk test: TBD  GAIT: Distance walked: 45 feet in gym with RW/ min. A for safety and cuing Assistive device utilized: Bryauna Juarez - 2 wheeled Level of assistance: Min A Comments: amb. In gym with use of RW/min A for cuing to increase step length/ heel strike.  Pt. Fearful while walking outside of //-bars.  Pt. Ambs. In //-bars with B UE assist and  increase cadence/ step pattern.  Use of mirror for posture correction/ heel strike (pt. Does not looking at a mirror).     TODAY'S TREATMENT:  DATE: 08/28/22   Subjective: Patient reports feeling as if her prosthetic is not fitting correctly. Arrives in prosthetic with 5ply sock.   Treatment:  Gait training in // bars x 3 laps with cues for heel strike and upright posture.   Side stepping in // bars x 3 laps with B UE support on bars  Sit to stand x 10 from standard w/c with use of B UE on arms of w/c  Standing hip abduction/marching/extension x 10 each LE, B UE support for balance  Ambulation 46' with RW and minA for balance, cues for increasing step length, heel strike, and upright posture. W/c follow as patient is easily fatigued.   PATIENT EDUCATION:  Education details: Discussed HEP/ standing hip ex. At [employer] Person educated: Patient and Child(ren) Education method: Medical illustrator Education comprehension: verbalized understanding and returned demonstration  HOME EXERCISE PROGRAM: Access Code: [email or web address] URL: [email or web address] Date: 08/30/2022 Prepared by: Maylon Peppers  Exercises - Sit to Stand with Counter Support  - 1 x daily - 5 x weekly - 3 sets - 10 reps - Standing Hip Abduction with Counter Support  - 1 x daily - 5 x weekly - 3 sets - 10 reps - Standing March with Counter Support  - 1 x daily - 5 x weekly - 3 sets - 10 reps - Standing Hip Extension with Counter Support  - 1 x daily - 5 x weekly - 3 sets - 10 reps  ASSESSMENT:  CLINICAL IMPRESSION:   Patient arrives to session with prosthetic donned with 5 ply sock. Assisted with adjusting prosthetic as patient reporting not fitting properly due to potential swelling of residual limb.Session focused on gait training in // bars and RW and standing LE  strengthening exercises. Cues required throughout gait training for heel strike and increasing step length. Good tolerance to exercises. Fatigues quickly with standing exercises. Patient will continue to benefit from skilled PT services to increase B LE muscle strength to improve independence/safety with prosthetic/gait training.    OBJECTIVE IMPAIRMENTS: Abnormal gait, decreased activity tolerance, decreased balance, decreased endurance, decreased mobility, difficulty walking, decreased ROM, decreased strength, decreased safety awareness, improper body mechanics, postural dysfunction, and pain.   ACTIVITY LIMITATIONS: carrying, lifting, standing, squatting, stairs, transfers, bed mobility, bathing, toileting, dressing, and locomotion level  PARTICIPATION LIMITATIONS: meal prep, cleaning, laundry, shopping, community activity, and church  PERSONAL FACTORS: Fitness and Past/current experiences are also affecting patient's functional outcome.   REHAB POTENTIAL: Good  CLINICAL DECISION MAKING: Evolving/moderate complexity  EVALUATION COMPLEXITY: Moderate   GOALS: Goals reviewed with patient? Yes  SHORT TERM GOALS: Target date: 09/18/22 Pt. Able to tolerate standing 10 minutes in kitchen with use of prosthetic leg to improve meal preparation.   Baseline:  limited standing tolerance Goal status: INITIAL   LONG TERM GOALS: Target date: 10/09/22  Pt. Will increase FOTO to 46 to improve pain-free mobility.   Baseline: initial FOTO 29 Goal status: INITIAL  2.  Pt. Will increase B LE muscle strength 1/2 muscle grade to improve pain-free mobility/ walking.   Baseline:  See above Goal status: INITIAL  3.  Pt. Able to ambulate 200 feet with mod. I with least assistive device to improve return to home.   Baseline: amb. Short distances with RW/ min. A from PT.  Limited endurance Goal status: INITIAL   PLAN:  PT FREQUENCY: 2x/week  PT DURATION: 6 weeks  PLANNED INTERVENTIONS: Therapeutic  exercises, Therapeutic activity, Neuromuscular re-education, Balance training, Gait training, Patient/Family education, Self Care,  Joint mobilization, Stair training, Prosthetic training, DME instructions, Manual therapy, and Re-evaluation  PLAN FOR NEXT SESSION: Gait training in //-bars/ review standing HEP   Maylon Peppers, PT, DPT Physical Therapist - [place]  [facility]  08/30/2022, 4:57 PM

## 2022-09-04 ENCOUNTER — Ambulatory Visit: Payer: Medicare Other | Admitting: Physical Therapy

## 2022-09-06 ENCOUNTER — Ambulatory Visit: Payer: Medicare Other

## 2022-09-06 ENCOUNTER — Encounter: Payer: Self-pay | Admitting: Physical Therapy

## 2022-09-06 DIAGNOSIS — Z89511 Acquired absence of right leg below knee: Secondary | ICD-10-CM

## 2022-09-06 DIAGNOSIS — M6281 Muscle weakness (generalized): Secondary | ICD-10-CM

## 2022-09-06 DIAGNOSIS — R269 Unspecified abnormalities of gait and mobility: Secondary | ICD-10-CM

## 2022-09-06 NOTE — Therapy (Signed)
OUTPATIENT PHYSICAL THERAPY LOWER EXTREMITY TREATMENT  Patient Name: Melanie Juarez MRN: [id number] DOB:11/21/1945, 77 y.o., female Today's Date: 09/06/2022  END OF SESSION:  PT End of Session - 09/06/22 1517     Visit Number 3    Number of Visits 12    Date for PT Re-Evaluation 10/09/22    PT Start Time 1510    PT Stop Time 1552    PT Time Calculation (min) 42 min    Equipment Utilized During Treatment Gait belt    Activity Tolerance Patient tolerated treatment well    Behavior During Therapy WFL for tasks assessed/performed               Past Medical History:  Diagnosis Date   Diabetes (HCC)    Hypertension    Past Surgical History:  Procedure Laterality Date   AMPUTATION Right 03/22/2022   Procedure: AMPUTATION BELOW KNEE-Guillotine;  Surgeon: Annice Needy, MD;  Location: [facility];  Service: Vascular;  Laterality: Right;   LOWER EXTREMITY ANGIOGRAPHY Right 03/21/2022   Procedure: Lower Extremity Angiography;  Surgeon: Annice Needy, MD;  Location: [facility] IN[facility];  Service: Cardiovascular;  Laterality: Right;   STUMP REVISION Right 03/26/2022   Procedure: BKA REVISION;  Surgeon: Annice Needy, MD;  Location: [facility] OR[facility];  Service: General;  Laterality: Right;   Patient Active Problem List   Diagnosis Date Noted   Hx of BKA, right (HCC) 08/08/2022   Lactic acidosis 03/21/2022   Obesity (BMI 30-39.9) 03/21/2022   PAD (peripheral artery disease) (HCC) 03/21/2022   Osteomyelitis (HCC) 03/19/2022   Diabetic infection of right foot (HCC) 03/19/2022   Essential hypertension 03/19/2022   Leukocytosis 03/19/2022   AKI (acute kidney injury) (HCC) 03/19/2022   Sepsis with acute renal failure without septic shock (HCC) 03/19/2022   Hyperlipidemia 05/17/2014   Type 2 diabetes mellitus (HCC) 05/17/2014   Anemia, unspecified 05/18/2013   Benign essential hypertension 05/18/2013   REFERRING PROVIDER: Annice Needy, MD  REFERRING DIAG: s/p R BKA  THERAPY DIAG:  Hx of BKA,  right (HCC)  Muscle weakness (generalized)  Gait difficulty  Rationale for Evaluation and Treatment: Rehabilitation  ONSET DATE: 03/22/22  SUBJECTIVE:   SUBJECTIVE STATEMENT: Pt. Had a sore on R foot resulting in R BKA on 03/22/22.  [name]  Pt. Wants to return to living independently.  Pt. Completing HEP from rehab center.  Pt. Has been unable to get into shower.    PERTINENT HISTORY: TORIANN SPADONI is a 77 y.o. (1945/12/03) female who presents with chief complaint of     Chief Complaint  Patient presents with   Venous Insufficiency  .   History of Present Illness: Patient returns today in follow up of her right BKA.  It is healed.  She is in the process of getting her prosthesis now.  She is doing well.  No pain.  No left leg ulceration or rest pain.    Assessment/Plan   PAD (peripheral artery disease) (HCC) Check left ABI on follow up visit in 3 months.    Type 2 diabetes mellitus (HCC) blood glucose control important in reducing the progression of atherosclerotic disease. Also, involved in wound healing. On appropriate medications.    Essential hypertension blood pressure control important in reducing the progression of atherosclerotic disease. On appropriate oral medications.    Hx of BKA, right (HCC) Healed.  In the process of getting her prosthesis.  A  new prescription given for physical therapy for prosthesis and ambulatory training.   [name], MD   08/08/2022 10:42 AM  PAIN:  Are you having pain? No.  Pt. Reports phantom limb symptoms but not pain.    PRECAUTIONS: Fall  RED FLAGS: None   WEIGHT BEARING RESTRICTIONS: No  FALLS:  Has patient fallen in last 6 months? No  LIVING ENVIRONMENT: Lives with: lives with their daughter  [name].   Lives in: House/apartment Stairs: No Has following equipment at home: Dan Humphreys - 2 wheeled, Wheelchair (manual), and Ramped entry  OCCUPATION: Retired  PLOF:  Independent.  Pt. Does not drive.    PATIENT GOALS: Ambulate with least assistive device.  Return home independently/ go back to church.    NEXT MD VISIT: [name] 7/31.  MD f/u in September?  OBJECTIVE:   PATIENT SURVEYS:  FOTO initial 29/ goal 21.    COGNITION: Overall cognitive status: Within functional limits for tasks assessed     SENSATION: WFL  EDEMA:  No R residual limb swelling noted.  PT discussed use of shrinker and proper use of ply socks for improved fit of prosthetic leg.    POSTURE: rounded shoulders and forward head  PALPATION: Minimal tenderness along R distal residual limb/ incision.  Good incision healing/ PT discussed benefits of scar massage.    LOWER EXTREMITY ROM:  Active ROM Right eval Left eval  Hip flexion [facility] WFL  Hip extension NT NT  Hip abduction [facility]  Hip adduction    Hip internal rotation    Hip external rotation    Knee flexion [facility] WFL  Knee extension -3 deg. 0 deg.  Ankle dorsiflexion    Ankle plantarflexion    Ankle inversion    Ankle eversion     (Blank rows = not tested)  LOWER EXTREMITY MMT:  MMT Right eval Left eval  Hip flexion 4/5 4/5  Hip extension    Hip abduction 3+/5 4/5  Hip adduction    Hip internal rotation    Hip external rotation    Knee flexion 4+/5  4+/5  Knee extension 4+/5 5/5  Ankle dorsiflexion    Ankle plantarflexion    Ankle inversion    Ankle eversion     (Blank rows = not tested)   B UE AROM WFL.  B UE strength grossly 4/5 MMT except bicep/tricep 4+/5 MMT  FUNCTIONAL TESTS:  5 times sit to stand: TBD Timed up and go (TUG): TBD 6 minute walk test: TBD  GAIT: Distance walked: 45 feet in gym with RW/ min. A for safety and cuing Assistive device utilized: Hermann Dottavio - 2 wheeled Level of assistance: Min A Comments: amb. In gym with use of RW/min A for cuing to increase step length/ heel strike.  Pt. Fearful while walking outside of //-bars.  Pt. Ambs. In //-bars with B UE assist and  increase cadence/ step pattern.  Use of mirror for posture correction/ heel strike (pt. Does not looking at a mirror).     TODAY'S TREATMENT:  DATE: 08/28/22   Subjective: Patient reports feeling tired and swelling in her R residual limb. States she believes it is from not having anymore fluid pills from her doctor   Treatment:   Nustep level 3 (seat 10) x 8 minutes to improve cardiorespiratory endurance.     Ambulated from Nustep to // bars with RW, cues for heel strike and increasing step length    Gait training in // bars x 3 laps with cues for heel strike and upright posture.   Side stepping in // bars x 3 laps with B UE support on bars  Sit to stand x 10 from standard w/c with use of B UE on arms of w/c  Standing hip abduction/marching/extension x 10 each LE, B UE support for balance (not today)  PATIENT EDUCATION:  Education details: Discussed HEP/ standing hip ex. At [employer] Person educated: Patient and Child(ren) Education method: Medical illustrator Education comprehension: verbalized understanding and returned demonstration  HOME EXERCISE PROGRAM: Access Code: [email or web address] URL: [email or web address] Date: 08/30/2022 Prepared by: Maylon Peppers  Exercises - Sit to Stand with Counter Support  - 1 x daily - 5 x weekly - 3 sets - 10 reps - Standing Hip Abduction with Counter Support  - 1 x daily - 5 x weekly - 3 sets - 10 reps - Standing March with Counter Support  - 1 x daily - 5 x weekly - 3 sets - 10 reps - Standing Hip Extension with Counter Support  - 1 x daily - 5 x weekly - 3 sets - 10 reps  ASSESSMENT:  CLINICAL IMPRESSION:   Patient arrives to session with prosthetic donned with 5 ply sock. Patient reports feeling as if R residual limb is swollen and states possibly due to being out of fluid pills from doctor.  Session focused on gait training in // bars and RW and general LE strengthening. Cues required throughout gait training for heel strike and increasing step length. Good tolerance to exercises. Fatigues quickly with standing exercises and also reporting increased fatigue this date. Patient will continue to benefit from skilled PT services to increase B LE muscle strength to improve independence/safety with prosthetic/gait training.    OBJECTIVE IMPAIRMENTS: Abnormal gait, decreased activity tolerance, decreased balance, decreased endurance, decreased mobility, difficulty walking, decreased ROM, decreased strength, decreased safety awareness, improper body mechanics, postural dysfunction, and pain.   ACTIVITY LIMITATIONS: carrying, lifting, standing, squatting, stairs, transfers, bed mobility, bathing, toileting, dressing, and locomotion level  PARTICIPATION LIMITATIONS: meal prep, cleaning, laundry, shopping, community activity, and church  PERSONAL FACTORS: Fitness and Past/current experiences are also affecting patient's functional outcome.   REHAB POTENTIAL: Good  CLINICAL DECISION MAKING: Evolving/moderate complexity  EVALUATION COMPLEXITY: Moderate   GOALS: Goals reviewed with patient? Yes  SHORT TERM GOALS: Target date: 09/18/22 Pt. Able to tolerate standing 10 minutes in kitchen with use of prosthetic leg to improve meal preparation.   Baseline:  limited standing tolerance Goal status: INITIAL   LONG TERM GOALS: Target date: 10/09/22  Pt. Will increase FOTO to 46 to improve pain-free mobility.   Baseline: initial FOTO 29 Goal status: INITIAL  2.  Pt. Will increase B LE muscle strength 1/2 muscle grade to improve pain-free mobility/ walking.   Baseline:  See above Goal status: INITIAL  3.  Pt. Able to ambulate 200 feet with mod. I with least assistive device to improve return to home.   Baseline: amb. Short distances with RW/ min. A from PT.  Limited endurance Goal status:  INITIAL   PLAN:  PT FREQUENCY: 2x/week  PT DURATION: 6 weeks  PLANNED INTERVENTIONS: Therapeutic exercises, Therapeutic activity, Neuromuscular re-education, Balance training, Gait training, Patient/Family education, Self Care, Joint mobilization, Stair training, Prosthetic training, DME instructions, Manual therapy, and Re-evaluation  PLAN FOR NEXT SESSION: Gait training in //-bars/ review standing HEP   Maylon Peppers, PT, DPT Physical Therapist - [place]  [facility]  09/06/2022, 3:56 PM

## 2022-09-11 ENCOUNTER — Ambulatory Visit: Payer: Medicare Other | Admitting: Physical Therapy

## 2022-09-11 DIAGNOSIS — Z89511 Acquired absence of right leg below knee: Secondary | ICD-10-CM | POA: Diagnosis not present

## 2022-09-11 DIAGNOSIS — R269 Unspecified abnormalities of gait and mobility: Secondary | ICD-10-CM

## 2022-09-11 DIAGNOSIS — M6281 Muscle weakness (generalized): Secondary | ICD-10-CM

## 2022-09-11 NOTE — Therapy (Signed)
OUTPATIENT PHYSICAL THERAPY LOWER EXTREMITY TREATMENT  Patient Name: Melanie Juarez MRN: [id number] DOB:04/08/1945, 77 y.o., female Today's Date: 09/11/2022  END OF SESSION:  PT End of Session - 09/11/22 1515     Visit Number 4    Number of Visits 12    Date for PT Re-Evaluation 10/09/22    PT Start Time 1515    PT Stop Time 1618    PT Time Calculation (min) 63 min    Equipment Utilized During Treatment Gait belt    Activity Tolerance Patient tolerated treatment well    Behavior During Therapy WFL for tasks assessed/performed               Past Medical History:  Diagnosis Date   Diabetes (HCC)    Hypertension    Past Surgical History:  Procedure Laterality Date   AMPUTATION Right 03/22/2022   Procedure: AMPUTATION BELOW KNEE-Guillotine;  Surgeon: Annice Needy, MD;  Location: [facility] OR[facility];  Service: Vascular;  Laterality: Right;   LOWER EXTREMITY ANGIOGRAPHY Right 03/21/2022   Procedure: Lower Extremity Angiography;  Surgeon: Annice Needy, MD;  Location: [facility] IN[facility];  Service: Cardiovascular;  Laterality: Right;   STUMP REVISION Right 03/26/2022   Procedure: BKA REVISION;  Surgeon: Annice Needy, MD;  Location: [facility] ORS;  Service: General;  Laterality: Right;   Patient Active Problem List   Diagnosis Date Noted   Hx of BKA, right (HCC) 08/08/2022   Lactic acidosis 03/21/2022   Obesity (BMI 30-39.9) 03/21/2022   PAD (peripheral artery disease) (HCC) 03/21/2022   Osteomyelitis (HCC) 03/19/2022   Diabetic infection of right foot (HCC) 03/19/2022   Essential hypertension 03/19/2022   Leukocytosis 03/19/2022   AKI (acute kidney injury) (HCC) 03/19/2022   Sepsis with acute renal failure without septic shock (HCC) 03/19/2022   Hyperlipidemia 05/17/2014   Type 2 diabetes mellitus (HCC) 05/17/2014   Anemia, unspecified 05/18/2013   Benign essential hypertension 05/18/2013   REFERRING PROVIDER: Annice Needy, MD  REFERRING DIAG: s/p R BKA  THERAPY DIAG:  Hx of BKA,  right (HCC)  Muscle weakness (generalized)  Gait difficulty  Rationale for Evaluation and Treatment: Rehabilitation  ONSET DATE: 03/22/22  SUBJECTIVE:   SUBJECTIVE STATEMENT: Pt. Had a sore on R foot resulting in R BKA on 03/22/22.  Pt. Entered Pt with use of w/c and daughter Melanie Juarez) present.  Pt. Wants to return to living independently.  Pt. Completing HEP from rehab center.  Pt. Has been unable to get into shower.    PERTINENT HISTORY: Melanie Juarez is a 77 y.o. (05/05/1945) female who presents with chief complaint of     Chief Complaint  Patient presents with   Venous Insufficiency  .   History of Present Illness: Patient returns today in follow up of her right BKA.  It is healed.  She is in the process of getting her prosthesis now.  She is doing well.  No pain.  No left leg ulceration or rest pain.    Assessment/Plan   PAD (peripheral artery disease) (HCC) Check left ABI on follow up visit in 3 months.    Type 2 diabetes mellitus (HCC) blood glucose control important in reducing the progression of atherosclerotic disease. Also, involved in wound healing. On appropriate medications.    Essential hypertension blood pressure control important in reducing the progression of atherosclerotic disease. On appropriate oral medications.    Hx of BKA, right (HCC) Healed.  In the process of getting her prosthesis.  A  new prescription given for physical therapy for prosthesis and ambulatory training.   [name], MD   08/08/2022 10:42 AM  PAIN:  Are you having pain? No.  Pt. Reports phantom limb symptoms but not pain.    PRECAUTIONS: Fall  RED FLAGS: None   WEIGHT BEARING RESTRICTIONS: No  FALLS:  Has patient fallen in last 6 months? No  LIVING ENVIRONMENT: Lives with: lives with their daughter  Melanie Juarez).   Lives in: House/apartment Stairs: No Has following equipment at home: Dan Humphreys - 2 wheeled, Wheelchair (manual), and Ramped entry  OCCUPATION: Retired  PLOF:  Independent.  Pt. Does not drive.    PATIENT GOALS: Ambulate with least assistive device.  Return home independently/ go back to church.    NEXT MD VISIT: [name] 7/31.  MD f/u in September?  OBJECTIVE:   PATIENT SURVEYS:  FOTO initial 29/ goal 23.    COGNITION: Overall cognitive status: Within functional limits for tasks assessed     SENSATION: WFL  EDEMA:  No R residual limb swelling noted.  PT discussed use of shrinker and proper use of ply socks for improved fit of prosthetic leg.    POSTURE: rounded shoulders and forward head  PALPATION: Minimal tenderness along R distal residual limb/ incision.  Good incision healing/ PT discussed benefits of scar massage.    LOWER EXTREMITY ROM:  Active ROM Right eval Left eval  Hip flexion [facility] WFL  Hip extension NT NT  Hip abduction [facility]  Hip adduction    Hip internal rotation    Hip external rotation    Knee flexion [facility] WFL  Knee extension -3 deg. 0 deg.  Ankle dorsiflexion    Ankle plantarflexion    Ankle inversion    Ankle eversion     (Blank rows = not tested)  LOWER EXTREMITY MMT:  MMT Right eval Left eval  Hip flexion 4/5 4/5  Hip extension    Hip abduction 3+/5 4/5  Hip adduction    Hip internal rotation    Hip external rotation    Knee flexion 4+/5  4+/5  Knee extension 4+/5 5/5  Ankle dorsiflexion    Ankle plantarflexion    Ankle inversion    Ankle eversion     (Blank rows = not tested)   B UE AROM WFL.  B UE strength grossly 4/5 MMT except bicep/tricep 4+/5 MMT  FUNCTIONAL TESTS:  5 times sit to stand: TBD Timed up and go (TUG): TBD 6 minute walk test: TBD  GAIT: Distance walked: 45 feet in gym with RW/ min. A for safety and cuing Assistive device utilized: Walker - 2 wheeled Level of assistance: Min A Comments: amb. In gym with use of RW/min A for cuing to increase step length/ heel strike.  Pt. Fearful while walking outside of //-bars.  Pt. Ambs. In //-bars with B UE assist and  increase cadence/ step pattern.  Use of mirror for posture correction/ heel strike (pt. Does not looking at a mirror).     TODAY'S TREATMENT:  DATE: 09/11/2022  Subjective: Pt. entered PT with use of w/c and prosthetic leg donned.  No c/o pain but pt. States leg doesn't seem to be fitting right today (PT checked).  Pt. Currently using 3 ply socks and discussed increasing to 5 ply.     Treatment:    Gait training:  amb. In clinic with use of RW working on consistent recip. Step pattern.  Limited L LE step length.   Step over 3" plinth (single board)- forward/lateral in //-bars with UE assist (4 laps).    Walking in //-bars with L UE only working on consistent 2-point gait pattern/ heel strike/ upright posture.    There.ex.:  Nustep level 4 (seat 10) x 7 minutes to improve cardiorespiratory endurance.    Standing hip ex. In //-bars (reviewed HEP).    Ambulated from Nustep to car in front of clinic, cues for heel strike and increasing step length     PATIENT EDUCATION:  Education details: Discussed HEP/ standing hip ex. At [employer] Person educated: Patient and Child(ren) Education method: Medical illustrator Education comprehension: verbalized understanding and returned demonstration  HOME EXERCISE PROGRAM: Access Code: [email or web address] URL: [email or web address] Date: 08/30/2022 Prepared by: Maylon Peppers  Exercises - Sit to Stand with Counter Support  - 1 x daily - 5 x weekly - 3 sets - 10 reps - Standing Hip Abduction with Counter Support  - 1 x daily - 5 x weekly - 3 sets - 10 reps - Standing March with Counter Support  - 1 x daily - 5 x weekly - 3 sets - 10 reps - Standing Hip Extension with Counter Support  - 1 x daily - 5 x weekly - 3 sets - 10 reps  ASSESSMENT:  CLINICAL IMPRESSION:   Patient arrives to session with  prosthetic donned with 3 ply sock.  Session focused on gait training in // bars and RW and general LE strengthening. Cues required throughout gait training for heel strike and increasing step length. Good tolerance to exercises. Fatigues quickly with standing exercises and also reporting increased fatigue this date.   PT encouraged pt/dtr. To walk with RW at home several times/day to improve endurance.  Patient will continue to benefit from skilled PT services to increase B LE muscle strength to improve independence/safety with prosthetic/gait training.    OBJECTIVE IMPAIRMENTS: Abnormal gait, decreased activity tolerance, decreased balance, decreased endurance, decreased mobility, difficulty walking, decreased ROM, decreased strength, decreased safety awareness, improper body mechanics, postural dysfunction, and pain.   ACTIVITY LIMITATIONS: carrying, lifting, standing, squatting, stairs, transfers, bed mobility, bathing, toileting, dressing, and locomotion level  PARTICIPATION LIMITATIONS: meal prep, cleaning, laundry, shopping, community activity, and church  PERSONAL FACTORS: Fitness and Past/current experiences are also affecting patient's functional outcome.   REHAB POTENTIAL: Good  CLINICAL DECISION MAKING: Evolving/moderate complexity  EVALUATION COMPLEXITY: Moderate   GOALS: Goals reviewed with patient? Yes  SHORT TERM GOALS: Target date: 09/18/22 Pt. Able to tolerate standing 10 minutes in kitchen with use of prosthetic leg to improve meal preparation.   Baseline:  limited standing tolerance Goal status: INITIAL   LONG TERM GOALS: Target date: 10/09/22  Pt. Will increase FOTO to 46 to improve pain-free mobility.   Baseline: initial FOTO 29 Goal status: INITIAL  2.  Pt. Will increase B LE muscle strength 1/2 muscle grade to improve pain-free mobility/ walking.   Baseline:  See above Goal status: INITIAL  3.  Pt. Able to ambulate 200 feet with mod. I with least assistive  device to improve return to home.   Baseline: amb. Short distances with RW/ min. A from PT.  Limited endurance Goal status: INITIAL   PLAN:  PT FREQUENCY: 2x/week  PT DURATION: 6 weeks  PLANNED INTERVENTIONS: Therapeutic exercises, Therapeutic activity, Neuromuscular re-education, Balance training, Gait training, Patient/Family education, Self Care, Joint mobilization, Stair training, Prosthetic training, DME instructions, Manual therapy, and Re-evaluation  PLAN FOR NEXT SESSION: Gait training in //-bars/ Discuss walking at home with dtr. Assist.    Cammie Mcgee, PT, DPT # [phone] Physical Therapist - [facility]  09/11/2022, 8:56 PM

## 2022-09-13 ENCOUNTER — Encounter: Payer: Self-pay | Admitting: Physical Therapy

## 2022-09-13 ENCOUNTER — Ambulatory Visit: Payer: Medicare Other | Attending: Vascular Surgery | Admitting: Physical Therapy

## 2022-09-13 DIAGNOSIS — M6281 Muscle weakness (generalized): Secondary | ICD-10-CM | POA: Diagnosis present

## 2022-09-13 DIAGNOSIS — Z89511 Acquired absence of right leg below knee: Secondary | ICD-10-CM

## 2022-09-13 DIAGNOSIS — R269 Unspecified abnormalities of gait and mobility: Secondary | ICD-10-CM

## 2022-09-13 NOTE — Therapy (Signed)
OUTPATIENT PHYSICAL THERAPY LOWER EXTREMITY TREATMENT  Patient Name: Melanie Juarez MRN: [id number] DOB:08/14/1945, 77 y.o., female Today's Date: 09/13/2022  END OF SESSION:  PT End of Session - 09/13/22 1515     Visit Number 5    Number of Visits 12    Date for PT Re-Evaluation 10/09/22    PT Start Time 1511    PT Stop Time 1600    PT Time Calculation (min) 49 min    Equipment Utilized During Treatment Gait belt    Activity Tolerance Patient tolerated treatment well    Behavior During Therapy WFL for tasks assessed/performed               Past Medical History:  Diagnosis Date   Diabetes (HCC)    Hypertension    Past Surgical History:  Procedure Laterality Date   AMPUTATION Right 03/22/2022   Procedure: AMPUTATION BELOW KNEE-Guillotine;  Surgeon: Annice Needy, MD;  Location: [facility];  Service: Vascular;  Laterality: Right;   LOWER EXTREMITY ANGIOGRAPHY Right 03/21/2022   Procedure: Lower Extremity Angiography;  Surgeon: Annice Needy, MD;  Location: [facility];  Service: Cardiovascular;  Laterality: Right;   STUMP REVISION Right 03/26/2022   Procedure: BKA REVISION;  Surgeon: Annice Needy, MD;  Location: [facility];  Service: General;  Laterality: Right;   Patient Active Problem List   Diagnosis Date Noted   Hx of BKA, right (HCC) 08/08/2022   Lactic acidosis 03/21/2022   Obesity (BMI 30-39.9) 03/21/2022   PAD (peripheral artery disease) (HCC) 03/21/2022   Osteomyelitis (HCC) 03/19/2022   Diabetic infection of right foot (HCC) 03/19/2022   Essential hypertension 03/19/2022   Leukocytosis 03/19/2022   AKI (acute kidney injury) (HCC) 03/19/2022   Sepsis with acute renal failure without septic shock (HCC) 03/19/2022   Hyperlipidemia 05/17/2014   Type 2 diabetes mellitus (HCC) 05/17/2014   Anemia, unspecified 05/18/2013   Benign essential hypertension 05/18/2013   REFERRING PROVIDER: Annice Needy, MD  REFERRING DIAG: s/p R BKA  THERAPY DIAG:  Hx of BKA,  right (HCC)  Muscle weakness (generalized)  Gait difficulty  Rationale for Evaluation and Treatment: Rehabilitation  ONSET DATE: 03/22/22  SUBJECTIVE:   SUBJECTIVE STATEMENT: Pt. Had a sore on R foot resulting in R BKA on 03/22/22.  Pt. Entered Pt with use of w/c and daughter Melanie Juarez) present.  Pt. Wants to return to living independently.  Pt. Completing HEP from rehab center.  Pt. Has been unable to get into shower.    PERTINENT HISTORY: Melanie Juarez is a 77 y.o. (08/05/1945) female who presents with chief complaint of     Chief Complaint  Patient presents with   Venous Insufficiency  .   History of Present Illness: Patient returns today in follow up of her right BKA.  It is healed.  She is in the process of getting her prosthesis now.  She is doing well.  No pain.  No left leg ulceration or rest pain.    Assessment/Plan   PAD (peripheral artery disease) (HCC) Check left ABI on follow up visit in 3 months.    Type 2 diabetes mellitus (HCC) blood glucose control important in reducing the progression of atherosclerotic disease. Also, involved in wound healing. On appropriate medications.    Essential hypertension blood pressure control important in reducing the progression of atherosclerotic disease. On appropriate oral medications.    Hx of BKA, right (HCC) Healed.  In the process of getting her prosthesis.  A  new prescription given for physical therapy for prosthesis and ambulatory training.   Melanie Barren, MD   08/08/2022 10:42 AM  PAIN:  Are you having pain? No.  Pt. Reports phantom limb symptoms but not pain.    PRECAUTIONS: Fall  RED FLAGS: None   WEIGHT BEARING RESTRICTIONS: No  FALLS:  Has patient fallen in last 6 months? No  LIVING ENVIRONMENT: Lives with: lives with their daughter  Melanie Juarez).   Lives in: House/apartment Stairs: No Has following equipment at home: Melanie Juarez - 2 wheeled, Wheelchair (manual), and Ramped entry  OCCUPATION: Retired  PLOF:  Independent.  Pt. Does not drive.    PATIENT GOALS: Ambulate with least assistive device.  Return home independently/ go back to church.    NEXT MD VISIT: Melanie Juarez 7/31.  MD f/u in September?  OBJECTIVE:   PATIENT SURVEYS:  FOTO initial 29/ goal 2.    COGNITION: Overall cognitive status: Within functional limits for tasks assessed     SENSATION: WFL  EDEMA:  No R residual limb swelling noted.  PT discussed use of shrinker and proper use of ply socks for improved fit of prosthetic leg.    POSTURE: rounded shoulders and forward head  PALPATION: Minimal tenderness along R distal residual limb/ incision.  Good incision healing/ PT discussed benefits of scar massage.    LOWER EXTREMITY ROM:  Active ROM Right eval Left eval  Hip flexion [facility] WFL  Hip extension NT NT  Hip abduction [facility]  Hip adduction    Hip internal rotation    Hip external rotation    Knee flexion [facility] WFL  Knee extension -3 deg. 0 deg.  Ankle dorsiflexion    Ankle plantarflexion    Ankle inversion    Ankle eversion     (Blank rows = not tested)  LOWER EXTREMITY MMT:  MMT Right eval Left eval  Hip flexion 4/5 4/5  Hip extension    Hip abduction 3+/5 4/5  Hip adduction    Hip internal rotation    Hip external rotation    Knee flexion 4+/5  4+/5  Knee extension 4+/5 5/5  Ankle dorsiflexion    Ankle plantarflexion    Ankle inversion    Ankle eversion     (Blank rows = not tested)   B UE AROM WFL.  B UE strength grossly 4/5 MMT except bicep/tricep 4+/5 MMT  FUNCTIONAL TESTS:  5 times sit to stand: TBD Timed up and go (TUG): TBD 6 minute walk test: TBD  GAIT: Distance walked: 45 feet in gym with RW/ min. A for safety and cuing Assistive device utilized: Walker - 2 wheeled Level of assistance: Min A Comments: amb. In gym with use of RW/min A for cuing to increase step length/ heel strike.  Pt. Fearful while walking outside of //-bars.  Pt. Ambs. In //-bars with B UE assist and  increase cadence/ step pattern.  Use of mirror for posture correction/ heel strike (pt. Does not looking at a mirror).     TODAY'S TREATMENT:  DATE: 09/13/2022  Subjective: Pt. entered PT with use of w/c and prosthetic leg donned.  Pt. Reports no pain in residual limb.  Pt. Was able to walk in house/kitchen with use of RW and dtr. Assist (no issues).    Treatment:   There.ex.: No charge  Nustep level 4 (seat 10) x 10 minutes to improve cardiorespiratory endurance.    Gait training:    Amb. around gym with use of RW working on consistent recip. Step pattern.  Cuing to increase L LE step length.   6" step ups on L/R with B UE assist on handrails (8x on L/ 4x with R).  Moderate fatigue noted.   Step over 3" plinth (single board)- forward/lateral in //-bars with UE assist (4 laps).    Walking in //-bars with L UE only working on consistent 2-point gait pattern/ heel strike/ upright posture.  Attempted use of SPC and requires light UE assist on //-bars for safety/ step pattern.    Ambulated from Pt gym to car in front of clinic, cues for heel strike and increasing step length.  Use of RW and CGA for safety/ verbal cuing to increase step length/ heel strike.  No LOB during tx.       PATIENT EDUCATION:  Education details: Discussed HEP/ standing hip ex. At [employer] Person educated: Patient and Child(ren) Education method: Medical illustrator Education comprehension: verbalized understanding and returned demonstration  HOME EXERCISE PROGRAM: Access Code: [email or web address] URL: [email or web address] Date: 08/30/2022 Prepared by: Maylon Peppers  Exercises - Sit to Stand with Counter Support  - 1 x daily - 5 x weekly - 3 sets - 10 reps - Standing Hip Abduction with Counter Support  - 1 x daily - 5 x weekly - 3 sets - 10 reps - Standing March  with Counter Support  - 1 x daily - 5 x weekly - 3 sets - 10 reps - Standing Hip Extension with Counter Support  - 1 x daily - 5 x weekly - 3 sets - 10 reps  ASSESSMENT:  CLINICAL IMPRESSION:   Patient arrives to session with prosthetic donned with 5 ply sock.  Session focused on gait training in // bars and RW and general LE strengthening. Cues required throughout gait training for heel strike and increasing step length. Good tolerance to exercises. Fatigues quickly with standing exercises and also reporting increased fatigue this date, however reports no pain in residual limb with standing/ambulating. PT encouraged pt/dtr. To walk with RW at home several times/day to improve endurance.  Patient will continue to benefit from skilled PT services to increase B LE muscle strength to improve independence/safety with prosthetic/gait training.    OBJECTIVE IMPAIRMENTS: Abnormal gait, decreased activity tolerance, decreased balance, decreased endurance, decreased mobility, difficulty walking, decreased ROM, decreased strength, decreased safety awareness, improper body mechanics, postural dysfunction, and pain.   ACTIVITY LIMITATIONS: carrying, lifting, standing, squatting, stairs, transfers, bed mobility, bathing, toileting, dressing, and locomotion level  PARTICIPATION LIMITATIONS: meal prep, cleaning, laundry, shopping, community activity, and church  PERSONAL FACTORS: Fitness and Past/current experiences are also affecting patient's functional outcome.   REHAB POTENTIAL: Good  CLINICAL DECISION MAKING: Evolving/moderate complexity  EVALUATION COMPLEXITY: Moderate   GOALS: Goals reviewed with patient? Yes  SHORT TERM GOALS: Target date: 09/18/22 Pt. Able to tolerate standing 10 minutes in kitchen with use of prosthetic leg to improve meal preparation.   Baseline:  limited standing tolerance Goal status: INITIAL   LONG TERM GOALS: Target date: 10/09/22  Pt. Will increase  FOTO to 46 to  improve pain-free mobility.   Baseline: initial FOTO 29 Goal status: INITIAL  2.  Pt. Will increase B LE muscle strength 1/2 muscle grade to improve pain-free mobility/ walking.   Baseline:  See above Goal status: INITIAL  3.  Pt. Able to ambulate 200 feet with mod. I with least assistive device to improve return to home.   Baseline: amb. Short distances with RW/ min. A from PT.  Limited endurance Goal status: INITIAL   PLAN:  PT FREQUENCY: 2x/week  PT DURATION: 6 weeks  PLANNED INTERVENTIONS: Therapeutic exercises, Therapeutic activity, Neuromuscular re-education, Balance training, Gait training, Patient/Family education, Self Care, Joint mobilization, Stair training, Prosthetic training, DME instructions, Manual therapy, and Re-evaluation  PLAN FOR NEXT SESSION: Gait training in //-bars/ Discuss walking at home with dtr. Assist.   5xSTS   Cammie Mcgee, PT, DPT # [id number] Physical Therapist - [facility]  09/13/2022, 5:42 PM

## 2022-09-18 ENCOUNTER — Ambulatory Visit: Payer: Medicare Other | Admitting: Physical Therapy

## 2022-09-18 ENCOUNTER — Encounter: Payer: Self-pay | Admitting: Physical Therapy

## 2022-09-18 DIAGNOSIS — Z89511 Acquired absence of right leg below knee: Secondary | ICD-10-CM

## 2022-09-18 DIAGNOSIS — M6281 Muscle weakness (generalized): Secondary | ICD-10-CM

## 2022-09-18 DIAGNOSIS — R269 Unspecified abnormalities of gait and mobility: Secondary | ICD-10-CM

## 2022-09-18 NOTE — Therapy (Signed)
OUTPATIENT PHYSICAL THERAPY LOWER EXTREMITY TREATMENT  Patient Name: Melanie Juarez MRN: [id number] DOB:1945/10/16, 77 y.o., female Today's Date: 09/18/2022  END OF SESSION:  PT End of Session - 09/18/22 1520     Visit Number 6    Number of Visits 12    Date for PT Re-Evaluation 10/09/22    PT Start Time 1512    PT Stop Time 1602    PT Time Calculation (min) 50 min    Equipment Utilized During Treatment Gait belt    Activity Tolerance Patient tolerated treatment well    Behavior During Therapy WFL for tasks assessed/performed              Past Medical History:  Diagnosis Date   Diabetes (HCC)    Hypertension    Past Surgical History:  Procedure Laterality Date   AMPUTATION Right 03/22/2022   Procedure: AMPUTATION BELOW KNEE-Guillotine;  Surgeon: Annice Needy, MD;  Location: [facility] OR[facility];  Service: Vascular;  Laterality: Right;   LOWER EXTREMITY ANGIOGRAPHY Right 03/21/2022   Procedure: Lower Extremity Angiography;  Surgeon: Annice Needy, MD;  Location: [facility];  Service: Cardiovascular;  Laterality: Right;   STUMP REVISION Right 03/26/2022   Procedure: BKA REVISION;  Surgeon: Annice Needy, MD;  Location: [facility] OR[facility];  Service: General;  Laterality: Right;   Patient Active Problem List   Diagnosis Date Noted   Hx of BKA, right (HCC) 08/08/2022   Lactic acidosis 03/21/2022   Obesity (BMI 30-39.9) 03/21/2022   PAD (peripheral artery disease) (HCC) 03/21/2022   Osteomyelitis (HCC) 03/19/2022   Diabetic infection of right foot (HCC) 03/19/2022   Essential hypertension 03/19/2022   Leukocytosis 03/19/2022   AKI (acute kidney injury) (HCC) 03/19/2022   Sepsis with acute renal failure without septic shock (HCC) 03/19/2022   Hyperlipidemia 05/17/2014   Type 2 diabetes mellitus (HCC) 05/17/2014   Anemia, unspecified 05/18/2013   Benign essential hypertension 05/18/2013   REFERRING PROVIDER: Annice Needy, MD  REFERRING DIAG: s/p R BKA  THERAPY DIAG:  Hx of BKA, right  (HCC)  Muscle weakness (generalized)  Gait difficulty  Rationale for Evaluation and Treatment: Rehabilitation  ONSET DATE: 03/22/22  SUBJECTIVE:   SUBJECTIVE STATEMENT: Pt. Had a sore on R foot resulting in R BKA on 03/22/22.  [name]  Pt. Wants to return to living independently.  Pt. Completing HEP from rehab center.  Pt. Has been unable to get into shower.    PERTINENT HISTORY: Melanie Juarez is a 77 y.o. (18-Nov-1945) female who presents with chief complaint of     Chief Complaint  Patient presents with   Venous Insufficiency  .   History of Present Illness: Patient returns today in follow up of her right BKA.  It is healed.  She is in the process of getting her prosthesis now.  She is doing well.  No pain.  No left leg ulceration or rest pain.    Assessment/Plan   PAD (peripheral artery disease) (HCC) Check left ABI on follow up visit in 3 months.    Type 2 diabetes mellitus (HCC) blood glucose control important in reducing the progression of atherosclerotic disease. Also, involved in wound healing. On appropriate medications.    Essential hypertension blood pressure control important in reducing the progression of atherosclerotic disease. On appropriate oral medications.    Hx of BKA, right (HCC) Healed.  In the process of getting her prosthesis.  A new  prescription given for physical therapy for prosthesis and ambulatory training.   [name], MD   08/08/2022 10:42 AM  PAIN:  Are you having pain? No.  Pt. Reports phantom limb symptoms but not pain.    PRECAUTIONS: Fall  RED FLAGS: None   WEIGHT BEARING RESTRICTIONS: No  FALLS:  Has patient fallen in last 6 months? No  LIVING ENVIRONMENT: Lives with: lives with their daughter  [name].   Lives in: House/apartment Stairs: No Has following equipment at home: Dan Humphreys - 2 wheeled, Wheelchair (manual), and Ramped entry  OCCUPATION: Retired  PLOF:  Independent.  Pt. Does not drive.    PATIENT GOALS: Ambulate with least assistive device.  Return home independently/ go back to church.    NEXT MD VISIT: [name] 7/31.  MD f/u in September?  OBJECTIVE:   PATIENT SURVEYS:  FOTO initial 29/ goal 39.    COGNITION: Overall cognitive status: Within functional limits for tasks assessed     SENSATION: WFL  EDEMA:  No R residual limb swelling noted.  PT discussed use of shrinker and proper use of ply socks for improved fit of prosthetic leg.    POSTURE: rounded shoulders and forward head  PALPATION: Minimal tenderness along R distal residual limb/ incision.  Good incision healing/ PT discussed benefits of scar massage.    LOWER EXTREMITY ROM:  Active ROM Right eval Left eval  Hip flexion [facility] WFL  Hip extension NT NT  Hip abduction [facility]  Hip adduction    Hip internal rotation    Hip external rotation    Knee flexion [facility] WFL  Knee extension -3 deg. 0 deg.  Ankle dorsiflexion    Ankle plantarflexion    Ankle inversion    Ankle eversion     (Blank rows = not tested)  LOWER EXTREMITY MMT:  MMT Right eval Left eval  Hip flexion 4/5 4/5  Hip extension    Hip abduction 3+/5 4/5  Hip adduction    Hip internal rotation    Hip external rotation    Knee flexion 4+/5  4+/5  Knee extension 4+/5 5/5  Ankle dorsiflexion    Ankle plantarflexion    Ankle inversion    Ankle eversion     (Blank rows = not tested)   B UE AROM WFL.  B UE strength grossly 4/5 MMT except bicep/tricep 4+/5 MMT  FUNCTIONAL TESTS:  5 times sit to stand: TBD Timed up and go (TUG): TBD 6 minute walk test: TBD  GAIT: Distance walked: 45 feet in gym with RW/ min. A for safety and cuing Assistive device utilized: Walker - 2 wheeled Level of assistance: Min A Comments: amb. In gym with use of RW/min A for cuing to increase step length/ heel strike.  Pt. Fearful while walking outside of //-bars.  Pt. Ambs. In //-bars with B UE assist and  increase cadence/ step pattern.  Use of mirror for posture correction/ heel strike (pt. Does not looking at a mirror).     TODAY'S TREATMENT:  DATE: 09/18/2022  Subjective: Pt. entered PT with use of w/c and prosthetic leg donned.  Pt. Reports no pain in residual limb.  Pt. Was able to walk in house/kitchen with use of RW and dtr. Assist (no issues).  PT discussed not using w/c to enter PT and will focus on using RW more often.    Treatment:   There.ex.:   Nustep level 4 (seat 10) x 10 minutes to improve cardiorespiratory endurance.   Seated LAQ/marching 20x.  Discussed importance of R knee extension.    6" step ups on L/R with B UE assist on handrails (10x on R.  Standing L/R hip abduction with light UE assist on handrails 10x2 each.     Gait training:    //-bars: high marching/ lateral walking 3 laps each.  B UE assist.  Mirror feedback to improve posture/ consistent step pattern/ heel strike.    Walking in //-bars with L UE only working on consistent 2-point gait pattern/ heel strike/ upright posture.  3 laps with SBA/CGA for safety and verbal cuing.  No SPC use today.  Walking in //-bars with alt. Cone taps with B UE assist, progress with use of L UE only.  2 laps/ challenged with R LE cone taps with L UE only.  No LOB.    Amb. around gym with use of RW working on consistent recip. Step pattern.  Cuing to increase L LE step length.   Ambulated from Pt gym to car in front of clinic, cues for heel strike and increasing step length.  Use of RW and CGA for safety/ verbal cuing to increase step length/ heel strike.  No LOB during tx.       Not Today Step over 3" plinth (single board)- forward/lateral in //-bars with UE assist (4 laps).    PATIENT EDUCATION:  Education details: Discussed HEP/ standing hip ex. At [employer] Person educated: Patient and  Child(ren) Education method: Medical illustrator Education comprehension: verbalized understanding and returned demonstration  HOME EXERCISE PROGRAM: Access Code: [email or web address] URL: [email or web address] Date: 08/30/2022 Prepared by: Maylon Peppers  Exercises - Sit to Stand with Counter Support  - 1 x daily - 5 x weekly - 3 sets - 10 reps - Standing Hip Abduction with Counter Support  - 1 x daily - 5 x weekly - 3 sets - 10 reps - Standing March with Counter Support  - 1 x daily - 5 x weekly - 3 sets - 10 reps - Standing Hip Extension with Counter Support  - 1 x daily - 5 x weekly - 3 sets - 10 reps  ASSESSMENT:  CLINICAL IMPRESSION:   Patient arrives to session with prosthetic donned with 5 ply sock.  Session focused on gait training in // bars and RW and general LE strengthening. Cues required throughout gait training for heel strike and increasing step length. Good tolerance to exercises. No c/o pain in residual limb with standing/ambulating but fatigue noted and several short seated rest breaks. PT encouraged pt/dtr. To walk with RW at home several times/day to improve endurance.  Pt. Also instructed to increase prosthetic wear time to 2-3 hours each in morning and evening. Patient will continue to benefit from skilled PT services to increase B LE muscle strength to improve independence/safety with prosthetic/gait training.    OBJECTIVE IMPAIRMENTS: Abnormal gait, decreased activity tolerance, decreased balance, decreased endurance, decreased mobility, difficulty walking, decreased ROM, decreased strength, decreased safety awareness, improper body mechanics, postural dysfunction, and pain.   ACTIVITY  LIMITATIONS: carrying, lifting, standing, squatting, stairs, transfers, bed mobility, bathing, toileting, dressing, and locomotion level  PARTICIPATION LIMITATIONS: meal prep, cleaning, laundry, shopping, community activity, and church  PERSONAL FACTORS: Fitness and  Past/current experiences are also affecting patient's functional outcome.   REHAB POTENTIAL: Good  CLINICAL DECISION MAKING: Evolving/moderate complexity  EVALUATION COMPLEXITY: Moderate   GOALS: Goals reviewed with patient? Yes  SHORT TERM GOALS: Target date: 09/18/22 Pt. Able to tolerate standing 10 minutes in kitchen with use of prosthetic leg to improve meal preparation.   Baseline:  limited standing tolerance Goal status: INITIAL   LONG TERM GOALS: Target date: 10/09/22  Pt. Will increase FOTO to 46 to improve pain-free mobility.   Baseline: initial FOTO 29 Goal status: INITIAL  2.  Pt. Will increase B LE muscle strength 1/2 muscle grade to improve pain-free mobility/ walking.   Baseline:  See above Goal status: INITIAL  3.  Pt. Able to ambulate 200 feet with mod. I with least assistive device to improve return to home.   Baseline: amb. Short distances with RW/ min. A from PT.  Limited endurance Goal status: INITIAL   PLAN:  PT FREQUENCY: 2x/week  PT DURATION: 6 weeks  PLANNED INTERVENTIONS: Therapeutic exercises, Therapeutic activity, Neuromuscular re-education, Balance training, Gait training, Patient/Family education, Self Care, Joint mobilization, Stair training, Prosthetic training, DME instructions, Manual therapy, and Re-evaluation  PLAN FOR NEXT SESSION: Gait training in //-bars/ Discuss walking at home with dtr. Assist.   5xSTS/ check goals.     Cammie Mcgee, PT, DPT # [phone] Physical Therapist - [facility]  09/18/2022, 6:06 PM

## 2022-09-20 ENCOUNTER — Ambulatory Visit: Payer: Medicare Other | Admitting: Physical Therapy

## 2022-09-24 ENCOUNTER — Encounter: Payer: Medicare Other | Admitting: Physical Therapy

## 2022-09-25 ENCOUNTER — Ambulatory Visit: Payer: Medicare Other

## 2022-09-25 ENCOUNTER — Encounter: Payer: Self-pay | Admitting: Physical Therapy

## 2022-09-25 ENCOUNTER — Encounter: Payer: Medicare Other | Admitting: Physical Therapy

## 2022-09-25 DIAGNOSIS — Z89511 Acquired absence of right leg below knee: Secondary | ICD-10-CM | POA: Diagnosis not present

## 2022-09-25 DIAGNOSIS — R269 Unspecified abnormalities of gait and mobility: Secondary | ICD-10-CM

## 2022-09-25 DIAGNOSIS — M6281 Muscle weakness (generalized): Secondary | ICD-10-CM

## 2022-09-25 NOTE — Therapy (Signed)
OUTPATIENT PHYSICAL THERAPY LOWER EXTREMITY TREATMENT  Patient Name: Melanie Juarez MRN: [id number] DOB:1946-01-31, 77 y.o., female Today's Date: 09/25/2022  END OF SESSION:  PT End of Session - 09/25/22 1715     Visit Number 7    Number of Visits 12    Date for PT Re-Evaluation 10/09/22    PT Start Time 1315    PT Stop Time 1345    PT Time Calculation (min) 30 min    Equipment Utilized During Treatment Gait belt    Activity Tolerance Patient tolerated treatment well    Behavior During Therapy WFL for tasks assessed/performed               Past Medical History:  Diagnosis Date   Diabetes (HCC)    Hypertension    Past Surgical History:  Procedure Laterality Date   AMPUTATION Right 03/22/2022   Procedure: AMPUTATION BELOW KNEE-Guillotine;  Surgeon: Annice Needy, MD;  Location: [facility]S;  Service: Vascular;  Laterality: Right;   LOWER EXTREMITY ANGIOGRAPHY Right 03/21/2022   Procedure: Lower Extremity Angiography;  Surgeon: Annice Needy, MD;  Location: [facility];  Service: Cardiovascular;  Laterality: Right;   STUMP REVISION Right 03/26/2022   Procedure: BKA REVISION;  Surgeon: Annice Needy, MD;  Location: [facility] OR[facility];  Service: General;  Laterality: Right;   Patient Active Problem List   Diagnosis Date Noted   Hx of BKA, right (HCC) 08/08/2022   Lactic acidosis 03/21/2022   Obesity (BMI 30-39.9) 03/21/2022   PAD (peripheral artery disease) (HCC) 03/21/2022   Osteomyelitis (HCC) 03/19/2022   Diabetic infection of right foot (HCC) 03/19/2022   Essential hypertension 03/19/2022   Leukocytosis 03/19/2022   AKI (acute kidney injury) (HCC) 03/19/2022   Sepsis with acute renal failure without septic shock (HCC) 03/19/2022   Hyperlipidemia 05/17/2014   Type 2 diabetes mellitus (HCC) 05/17/2014   Anemia, unspecified 05/18/2013   Benign essential hypertension 05/18/2013   REFERRING PROVIDER: Annice Needy, MD  REFERRING DIAG: s/p R BKA  THERAPY DIAG:  Hx of BKA,  right (HCC)  Muscle weakness (generalized)  Gait difficulty  Rationale for Evaluation and Treatment: Rehabilitation  ONSET DATE: 03/22/22  SUBJECTIVE:   SUBJECTIVE STATEMENT: Pt. Had a sore on R foot resulting in R BKA on 03/22/22.  [name]  Pt. Wants to return to living independently.  Pt. Completing HEP from rehab center.  Pt. Has been unable to get into shower.    PERTINENT HISTORY: Melanie Juarez is a 77 y.o. ([birth year]) female who presents with chief complaint of     Chief Complaint  Patient presents with   Venous Insufficiency  .   History of Present Illness: Patient returns today in follow up of her right BKA.  It is healed.  She is in the process of getting her prosthesis now.  She is doing well.  No pain.  No left leg ulceration or rest pain.    Assessment/Plan   PAD (peripheral artery disease) (HCC) Check left ABI on follow up visit in 3 months.    Type 2 diabetes mellitus (HCC) blood glucose control important in reducing the progression of atherosclerotic disease. Also, involved in wound healing. On appropriate medications.    Essential hypertension blood pressure control important in reducing the progression of atherosclerotic disease. On appropriate oral medications.    Hx of BKA, right (HCC) Healed.  In the process of getting her prosthesis.  A  new prescription given for physical therapy for prosthesis and ambulatory training.   [name], MD   08/08/2022 10:42 AM  PAIN:  Are you having pain? No.  Pt. Reports phantom limb symptoms but not pain.    PRECAUTIONS: Fall  RED FLAGS: None   WEIGHT BEARING RESTRICTIONS: No  FALLS:  Has patient fallen in last 6 months? No  LIVING ENVIRONMENT: Lives with: lives with their daughter  [name].   Lives in: House/apartment Stairs: No Has following equipment at home: Dan Humphreys - 2 wheeled, Wheelchair (manual), and Ramped entry  OCCUPATION: Retired  PLOF:  Independent.  Pt. Does not drive.    PATIENT GOALS: Ambulate with least assistive device.  Return home independently/ go back to church.    NEXT MD VISIT: [name] 7/31.  MD f/u in September?  OBJECTIVE:   PATIENT SURVEYS:  FOTO initial 29/ goal 4.    COGNITION: Overall cognitive status: Within functional limits for tasks assessed     SENSATION: WFL  EDEMA:  No R residual limb swelling noted.  PT discussed use of shrinker and proper use of ply socks for improved fit of prosthetic leg.    POSTURE: rounded shoulders and forward head  PALPATION: Minimal tenderness along R distal residual limb/ incision.  Good incision healing/ PT discussed benefits of scar massage.    LOWER EXTREMITY ROM:  Active ROM Right eval Left eval  Hip flexion [facility] WFL  Hip extension NT NT  Hip abduction [facility]  Hip adduction    Hip internal rotation    Hip external rotation    Knee flexion [facility] WFL  Knee extension -3 deg. 0 deg.  Ankle dorsiflexion    Ankle plantarflexion    Ankle inversion    Ankle eversion     (Blank rows = not tested)  LOWER EXTREMITY MMT:  MMT Right eval Left eval  Hip flexion 4/5 4/5  Hip extension    Hip abduction 3+/5 4/5  Hip adduction    Hip internal rotation    Hip external rotation    Knee flexion 4+/5  4+/5  Knee extension 4+/5 5/5  Ankle dorsiflexion    Ankle plantarflexion    Ankle inversion    Ankle eversion     (Blank rows = not tested)   B UE AROM WFL.  B UE strength grossly 4/5 MMT except bicep/tricep 4+/5 MMT  FUNCTIONAL TESTS:  5 times sit to stand: TBD Timed up and go (TUG): TBD 6 minute walk test: TBD  GAIT: Distance walked: 45 feet in gym with RW/ min. A for safety and cuing Assistive device utilized: Walker - 2 wheeled Level of assistance: Min A Comments: amb. In gym with use of RW/min A for cuing to increase step length/ heel strike.  Pt. Fearful while walking outside of //-bars.  Pt. Ambs. In //-bars with B UE assist and  increase cadence/ step pattern.  Use of mirror for posture correction/ heel strike (pt. Does not looking at a mirror).     TODAY'S TREATMENT:  DATE: 09/25/2022  Subjective: Pt. entered PT with use of RW and prosthetic leg donned.  Pt. Reports no pain in residual limb.  Pt. Was able to walk in house/kitchen with use of RW and dtr. Assist, she says it is going well but she still feels like she has a heightened fear of falling.  Pt also mentioned having some trouble with her vision in the R eye, says she has a doctor's appointment coming up on Monday.    Treatment:   There.ex.:   Nustep level 4 (seat 10) x 6 minutes to improve cardiorespiratory endurance.   6" step ups on L/R with B UE assist on handrails: 2x10     Gait training:    //-bars: high marching/ lateral walking 3 laps each.  B UE assist.  Mirror feedback to improve posture/ consistent step pattern/ heel strike.    Step over 3" plinth (single board)- forward/lateral in //-bars with UE assist (4 laps)  Walking in //-bars with L UE only working on consistent 2-point gait pattern/ heel strike/ upright posture.  2 laps with SBA/CGA for safety and verbal cuing.    Ambulated from Pt gym to car in front of clinic, cues for heel strike and increasing step length.  Use of RW and CGA for safety/ verbal cuing to increase step length/ heel strike.  No LOB during tx.       Not Today Seated LAQ/marching 20x.  Discussed importance of R knee extension.    Standing L/R hip abduction with light UE assist on handrails 10x2 each.   Amb. around gym with use of RW working on consistent recip. Step pattern.  Cuing to increase L LE step length.   Walking in //-bars with alt. Cone taps with B UE assist, progress with use of L UE only.  2 laps/ challenged with R LE cone taps with L UE only.  No LOB.   PATIENT EDUCATION:   Education details: Discussed HEP/ standing hip ex. At [employer] Person educated: Patient and Child(ren) Education method: Medical illustrator Education comprehension: verbalized understanding and returned demonstration  HOME EXERCISE PROGRAM: Access Code: [email or web address] URL: [email or web address] Date: 08/30/2022 Prepared by: Maylon Peppers  Exercises - Sit to Stand with Counter Support  - 1 x daily - 5 x weekly - 3 sets - 10 reps - Standing Hip Abduction with Counter Support  - 1 x daily - 5 x weekly - 3 sets - 10 reps - Standing March with Counter Support  - 1 x daily - 5 x weekly - 3 sets - 10 reps - Standing Hip Extension with Counter Support  - 1 x daily - 5 x weekly - 3 sets - 10 reps  ASSESSMENT:  CLINICAL IMPRESSION:   Pt arrives to PT with no reports of pain in R LE. Session today consisted of exercises aimed to improve standing/walking tolerance and LE strength. Pt continues to requiring occasional cueing for increased step length on L LE. CGA throughout all standing/walking exercises for safety. Pt requires several seated rest breaks throughout the session secondary to LE fatigue. Patient will continue to benefit from skilled PT services to increase B LE muscle strength to improve independence/safety with prosthetic/gait training.    OBJECTIVE IMPAIRMENTS: Abnormal gait, decreased activity tolerance, decreased balance, decreased endurance, decreased mobility, difficulty walking, decreased ROM, decreased strength, decreased safety awareness, improper body mechanics, postural dysfunction, and pain.   ACTIVITY LIMITATIONS: carrying, lifting, standing, squatting, stairs, transfers, bed mobility, bathing, toileting, dressing, and locomotion level  PARTICIPATION LIMITATIONS: meal prep, cleaning, laundry, shopping, community activity, and church  PERSONAL FACTORS: Fitness and Past/current experiences are also affecting patient's functional outcome.   REHAB  POTENTIAL: Good  CLINICAL DECISION MAKING: Evolving/moderate complexity  EVALUATION COMPLEXITY: Moderate   GOALS: Goals reviewed with patient? Yes  SHORT TERM GOALS: Target date: 09/18/22 Pt. Able to tolerate standing 10 minutes in kitchen with use of prosthetic leg to improve meal preparation.   Baseline:  limited standing tolerance Goal status: INITIAL   LONG TERM GOALS: Target date: 10/09/22  Pt. Will increase FOTO to 46 to improve pain-free mobility.   Baseline: initial FOTO 29 Goal status: INITIAL  2.  Pt. Will increase B LE muscle strength 1/2 muscle grade to improve pain-free mobility/ walking.   Baseline:  See above Goal status: INITIAL  3.  Pt. Able to ambulate 200 feet with mod. I with least assistive device to improve return to home.   Baseline: amb. Short distances with RW/ min. A from PT.  Limited endurance Goal status: INITIAL   PLAN:  PT FREQUENCY: 2x/week  PT DURATION: 6 weeks  PLANNED INTERVENTIONS: Therapeutic exercises, Therapeutic activity, Neuromuscular re-education, Balance training, Gait training, Patient/Family education, Self Care, Joint mobilization, Stair training, Prosthetic training, DME instructions, Manual therapy, and Re-evaluation  PLAN FOR NEXT SESSION: Gait training in //-bars/ Discuss walking at home with dtr. Assist.   5xSTS/ check goals.      , SPT  Maylon Peppers, PT, DPT Physical Therapist - [facility]  09/25/22, 5:16 PM

## 2022-09-26 ENCOUNTER — Encounter: Payer: Medicare Other | Admitting: Physical Therapy

## 2022-09-27 ENCOUNTER — Encounter: Payer: Medicare Other | Admitting: Physical Therapy

## 2022-09-28 ENCOUNTER — Encounter: Payer: Self-pay | Admitting: Physical Therapy

## 2022-09-28 ENCOUNTER — Ambulatory Visit: Payer: Medicare Other | Admitting: Physical Therapy

## 2022-09-28 DIAGNOSIS — R269 Unspecified abnormalities of gait and mobility: Secondary | ICD-10-CM

## 2022-09-28 DIAGNOSIS — M6281 Muscle weakness (generalized): Secondary | ICD-10-CM

## 2022-09-28 DIAGNOSIS — Z89511 Acquired absence of right leg below knee: Secondary | ICD-10-CM

## 2022-09-28 NOTE — Therapy (Signed)
OUTPATIENT PHYSICAL THERAPY LOWER EXTREMITY TREATMENT  Patient Name: MERECEDES CHANCY MRN: [id number] DOB:1945-11-17, 77 y.o., female Today's Date: 09/28/2022  END OF SESSION:  PT End of Session - 09/28/22 1450     Visit Number 8    Number of Visits 12    Date for PT Re-Evaluation 10/09/22    PT Start Time 1442    Equipment Utilized During Treatment Gait belt    Activity Tolerance Patient tolerated treatment well    Behavior During Therapy [facility] for tasks assessed/performed               Past Medical History:  Diagnosis Date   Diabetes (HCC)    Hypertension    Past Surgical History:  Procedure Laterality Date   AMPUTATION Right 03/22/2022   Procedure: AMPUTATION BELOW KNEE-Guillotine;  Surgeon: Annice Needy, MD;  Location: [facility] OR[facility];  Service: Vascular;  Laterality: Right;   LOWER EXTREMITY ANGIOGRAPHY Right 03/21/2022   Procedure: Lower Extremity Angiography;  Surgeon: Annice Needy, MD;  Location: [facility];  Service: Cardiovascular;  Laterality: Right;   STUMP REVISION Right 03/26/2022   Procedure: BKA REVISION;  Surgeon: Annice Needy, MD;  Location: [facility] OR[facility];  Service: General;  Laterality: Right;   Patient Active Problem List   Diagnosis Date Noted   Hx of BKA, right (HCC) 08/08/2022   Lactic acidosis 03/21/2022   Obesity (BMI 30-39.9) 03/21/2022   PAD (peripheral artery disease) (HCC) 03/21/2022   Osteomyelitis (HCC) 03/19/2022   Diabetic infection of right foot (HCC) 03/19/2022   Essential hypertension 03/19/2022   Leukocytosis 03/19/2022   AKI (acute kidney injury) (HCC) 03/19/2022   Sepsis with acute renal failure without septic shock (HCC) 03/19/2022   Hyperlipidemia 05/17/2014   Type 2 diabetes mellitus (HCC) 05/17/2014   Anemia, unspecified 05/18/2013   Benign essential hypertension 05/18/2013   REFERRING PROVIDER: Annice Needy, MD  REFERRING DIAG: s/p R BKA  THERAPY DIAG:  Hx of BKA, right (HCC)  Muscle weakness (generalized)  Gait  difficulty  Rationale for Evaluation and Treatment: Rehabilitation  ONSET DATE: 03/22/22  SUBJECTIVE:   SUBJECTIVE STATEMENT: Pt. Had a sore on R foot resulting in R BKA on 03/22/22.  [name]  Pt. Wants to return to living independently.  Pt. Completing HEP from rehab center.  Pt. Has been unable to get into shower.    PERTINENT HISTORY: ASHLEAH GLADMAN is a 77 y.o. (03/10/1945) female who presents with chief complaint of     Chief Complaint  Patient presents with   Venous Insufficiency  .   History of Present Illness: Patient returns today in follow up of her right BKA.  It is healed.  She is in the process of getting her prosthesis now.  She is doing well.  No pain.  No left leg ulceration or rest pain.    Assessment/Plan   PAD (peripheral artery disease) (HCC) Check left ABI on follow up visit in 3 months.    Type 2 diabetes mellitus (HCC) blood glucose control important in reducing the progression of atherosclerotic disease. Also, involved in wound healing. On appropriate medications.    Essential hypertension blood pressure control important in reducing the progression of atherosclerotic disease. On appropriate oral medications.    Hx of BKA, right (HCC) Healed.  In the process of getting her prosthesis.  A new prescription given for physical therapy for prosthesis and ambulatory training.   [name], MD  08/08/2022 10:42 AM  PAIN:  Are you having pain? No.  Pt. Reports phantom limb symptoms but not pain.    PRECAUTIONS: Fall  RED FLAGS: None   WEIGHT BEARING RESTRICTIONS: No  FALLS:  Has patient fallen in last 6 months? No  LIVING ENVIRONMENT: Lives with: lives with their daughter  [name].   Lives in: House/apartment Stairs: No Has following equipment at home: Dan Humphreys - 2 wheeled, Wheelchair (manual), and Ramped entry  OCCUPATION: Retired  PLOF: Independent.  Pt. Does not drive.    PATIENT GOALS:  Ambulate with least assistive device.  Return home independently/ go back to church.    NEXT MD VISIT: [name] 7/31.  MD f/u in September?  OBJECTIVE:   PATIENT SURVEYS:  FOTO initial 29/ goal 29.    COGNITION: Overall cognitive status: Within functional limits for tasks assessed     SENSATION: WFL  EDEMA:  No R residual limb swelling noted.  PT discussed use of shrinker and proper use of ply socks for improved fit of prosthetic leg.    POSTURE: rounded shoulders and forward head  PALPATION: Minimal tenderness along R distal residual limb/ incision.  Good incision healing/ PT discussed benefits of scar massage.    LOWER EXTREMITY ROM:  Active ROM Right eval Left eval  Hip flexion [facility] WFL  Hip extension NT NT  Hip abduction [facility]  Hip adduction    Hip internal rotation    Hip external rotation    Knee flexion [facility] WFL  Knee extension -3 deg. 0 deg.  Ankle dorsiflexion    Ankle plantarflexion    Ankle inversion    Ankle eversion     (Blank rows = not tested)  LOWER EXTREMITY MMT:  MMT Right eval Left eval  Hip flexion 4/5 4/5  Hip extension    Hip abduction 3+/5 4/5  Hip adduction    Hip internal rotation    Hip external rotation    Knee flexion 4+/5  4+/5  Knee extension 4+/5 5/5  Ankle dorsiflexion    Ankle plantarflexion    Ankle inversion    Ankle eversion     (Blank rows = not tested)   B UE AROM WFL.  B UE strength grossly 4/5 MMT except bicep/tricep 4+/5 MMT  FUNCTIONAL TESTS:  5 times sit to stand: TBD Timed up and go (TUG): TBD 6 minute walk test: TBD  GAIT: Distance walked: 45 feet in gym with RW/ min. A for safety and cuing Assistive device utilized: Walker - 2 wheeled Level of assistance: Min A Comments: amb. In gym with use of RW/min A for cuing to increase step length/ heel strike.  Pt. Fearful while walking outside of //-bars.  Pt. Ambs. In //-bars with B UE assist and increase cadence/ step pattern.  Use of mirror for posture  correction/ heel strike (pt. Does not looking at a mirror).     TODAY'S TREATMENT:  DATE: 09/28/2022  Subjective: Pt. entered PT with use of RW and prosthetic leg donned.  Pt. Reports no pain in residual limb.  Pt. States she is wearing prosthetic leg longer on a daily basis in morning/ afternoon.   Pt also mentioned having some trouble with her vision in the R eye, says she has a doctor's appointment coming up on Monday.   Pt. Dtr. States that she is planning on pt. To return home 8/23.  Pt. Will practice ascending/ descending ramp at home.     Treatment:   Gait training:    //-bars: high marching/ lateral walking 3 laps each.  B UE assist.  Mirror feedback to improve posture/ consistent step pattern/ heel strike.    Walking in //-bars with step over (6" hurdles)- B UE assist (3 laps).  Cuing to correct upright posture and avoid hip circumduction.    Walking in //-bars with L UE only working on consistent 2-point gait pattern/ heel strike/ upright posture.  2 laps with SBA/CGA for safety and verbal cuing.    Ambulated from Pt gym to car in front of clinic, cues for heel strike and increasing step length.  Use of RW and CGA for safety/ verbal cuing to increase step length/ heel strike.  No LOB during tx.       There.ex.:   Nustep level 4 (seat 10) x 6 minutes to improve cardiorespiratory endurance.   6" step ups on L/R with B UE assist on handrails: 2x10      Not Today Seated LAQ/marching 20x.  Discussed importance of R knee extension.    Standing L/R hip abduction with light UE assist on handrails 10x2 each.   Amb. around gym with use of RW working on consistent recip. Step pattern.  Cuing to increase L LE step length.   Walking in //-bars with alt. Cone taps with B UE assist, progress with use of L UE only.  2 laps/ challenged with R LE cone taps with L UE  only.  No LOB.   PATIENT EDUCATION:  Education details: Discussed HEP/ standing hip ex. At [employer] Person educated: Patient and Child(ren) Education method: Medical illustrator Education comprehension: verbalized understanding and returned demonstration  HOME EXERCISE PROGRAM: Access Code: [email or web address] URL: [email or web address] Date: 08/30/2022 Prepared by: Maylon Peppers  Exercises - Sit to Stand with Counter Support  - 1 x daily - 5 x weekly - 3 sets - 10 reps - Standing Hip Abduction with Counter Support  - 1 x daily - 5 x weekly - 3 sets - 10 reps - Standing March with Counter Support  - 1 x daily - 5 x weekly - 3 sets - 10 reps - Standing Hip Extension with Counter Support  - 1 x daily - 5 x weekly - 3 sets - 10 reps  ASSESSMENT:  CLINICAL IMPRESSION:   Pt arrives to PT with no reports of pain in R LE. Session today consisted of exercises aimed to improve standing/walking tolerance and LE strength. Pt continues to requiring occasional cueing for increased step length on L LE. CGA throughout all standing/walking exercises for safety. Pt requires several seated rest breaks throughout the session secondary to LE fatigue. Patient will continue to benefit from skilled PT services to increase B LE muscle strength to improve independence/safety with prosthetic/gait training.    OBJECTIVE IMPAIRMENTS: Abnormal gait, decreased activity tolerance, decreased balance, decreased endurance, decreased mobility, difficulty walking, decreased ROM, decreased strength, decreased safety awareness, improper body mechanics, postural dysfunction,  and pain.   ACTIVITY LIMITATIONS: carrying, lifting, standing, squatting, stairs, transfers, bed mobility, bathing, toileting, dressing, and locomotion level  PARTICIPATION LIMITATIONS: meal prep, cleaning, laundry, shopping, community activity, and church  PERSONAL FACTORS: Fitness and Past/current experiences are also affecting  patient's functional outcome.   REHAB POTENTIAL: Good  CLINICAL DECISION MAKING: Evolving/moderate complexity  EVALUATION COMPLEXITY: Moderate   GOALS: Goals reviewed with patient? Yes  SHORT TERM GOALS: Target date: 09/18/22 Pt. Able to tolerate standing 10 minutes in kitchen with use of prosthetic leg to improve meal preparation.   Baseline:  limited standing tolerance Goal status: Partially met   LONG TERM GOALS: Target date: 10/09/22  Pt. Will increase FOTO to 46 to improve pain-free mobility.   Baseline: initial FOTO 29 Goal status: INITIAL  2.  Pt. Will increase B LE muscle strength 1/2 muscle grade to improve pain-free mobility/ walking.   Baseline:  See above Goal status: INITIAL  3.  Pt. Able to ambulate 200 feet with mod. I with least assistive device to improve return to home.   Baseline: amb. Short distances with RW/ min. A from PT.  Limited endurance Goal status: INITIAL   PLAN:  PT FREQUENCY: 2x/week  PT DURATION: 6 weeks  PLANNED INTERVENTIONS: Therapeutic exercises, Therapeutic activity, Neuromuscular re-education, Balance training, Gait training, Patient/Family education, Self Care, Joint mobilization, Stair training, Prosthetic training, DME instructions, Manual therapy, and Re-evaluation  PLAN FOR NEXT SESSION: Gait training in //-bars/ Discuss walking on ramp/ returning home.   5xSTS/ check goals.    Cammie Mcgee, PT, DPT # [phone] Physical Therapist - [facility]  09/28/22, 2:51 PM

## 2022-10-01 ENCOUNTER — Encounter: Payer: Medicare Other | Admitting: Physical Therapy

## 2022-10-02 ENCOUNTER — Ambulatory Visit: Payer: Medicare Other

## 2022-10-02 ENCOUNTER — Encounter: Payer: Self-pay | Admitting: Physical Therapy

## 2022-10-02 ENCOUNTER — Encounter: Payer: Medicare Other | Admitting: Physical Therapy

## 2022-10-02 DIAGNOSIS — Z89511 Acquired absence of right leg below knee: Secondary | ICD-10-CM

## 2022-10-02 DIAGNOSIS — R269 Unspecified abnormalities of gait and mobility: Secondary | ICD-10-CM

## 2022-10-02 DIAGNOSIS — M6281 Muscle weakness (generalized): Secondary | ICD-10-CM

## 2022-10-02 NOTE — Therapy (Signed)
OUTPATIENT PHYSICAL THERAPY LOWER EXTREMITY TREATMENT  Patient Name: Melanie Juarez MRN: [id number] DOB:1945/12/10, 77 y.o., female Today's Date: 10/02/2022  END OF SESSION:  PT End of Session - 10/02/22 1408     Visit Number 9    Number of Visits 12    Date for PT Re-Evaluation 10/09/22    PT Start Time 1306    PT Stop Time 1350    PT Time Calculation (min) 44 min    Equipment Utilized During Treatment Gait belt    Activity Tolerance Patient tolerated treatment well    Behavior During Therapy WFL for tasks assessed/performed                Past Medical History:  Diagnosis Date   Diabetes (HCC)    Hypertension    Past Surgical History:  Procedure Laterality Date   AMPUTATION Right 03/22/2022   Procedure: AMPUTATION BELOW KNEE-Guillotine;  Surgeon: Annice Needy, MD;  Location: [facility] ORS;  Service: Vascular;  Laterality: Right;   LOWER EXTREMITY ANGIOGRAPHY Right 03/21/2022   Procedure: Lower Extremity Angiography;  Surgeon: Annice Needy, MD;  Location: [facility];  Service: Cardiovascular;  Laterality: Right;   STUMP REVISION Right 03/26/2022   Procedure: BKA REVISION;  Surgeon: Annice Needy, MD;  Location: [facility] ORS;  Service: General;  Laterality: Right;   Patient Active Problem List   Diagnosis Date Noted   Hx of BKA, right (HCC) 08/08/2022   Lactic acidosis 03/21/2022   Obesity (BMI 30-39.9) 03/21/2022   PAD (peripheral artery disease) (HCC) 03/21/2022   Osteomyelitis (HCC) 03/19/2022   Diabetic infection of right foot (HCC) 03/19/2022   Essential hypertension 03/19/2022   Leukocytosis 03/19/2022   AKI (acute kidney injury) (HCC) 03/19/2022   Sepsis with acute renal failure without septic shock (HCC) 03/19/2022   Hyperlipidemia 05/17/2014   Type 2 diabetes mellitus (HCC) 05/17/2014   Anemia, unspecified 05/18/2013   Benign essential hypertension 05/18/2013   REFERRING PROVIDER: Annice Needy, MD  REFERRING DIAG: s/p R BKA  THERAPY DIAG:  Hx of BKA,  right (HCC)  Muscle weakness (generalized)  Gait difficulty  Rationale for Evaluation and Treatment: Rehabilitation  ONSET DATE: 03/22/22  SUBJECTIVE:   SUBJECTIVE STATEMENT: Pt. Had a sore on R foot resulting in R BKA on 03/22/22.  [name]  Pt. Wants to return to living independently.  Pt. Completing HEP from rehab center.  Pt. Has been unable to get into shower.    PERTINENT HISTORY: [name] is a 77 y.o. (Jun 21, 1945) female who presents with chief complaint of     Chief Complaint  Patient presents with   Venous Insufficiency  .   History of Present Illness: Patient returns today in follow up of her right BKA.  It is healed.  She is in the process of getting her prosthesis now.  She is doing well.  No pain.  No left leg ulceration or rest pain.    Assessment/Plan   PAD (peripheral artery disease) (HCC) Check left ABI on follow up visit in 3 months.    Type 2 diabetes mellitus (HCC) blood glucose control important in reducing the progression of atherosclerotic disease. Also, involved in wound healing. On appropriate medications.    Essential hypertension blood pressure control important in reducing the progression of atherosclerotic disease. On appropriate oral medications.    Hx of BKA, right (HCC) Healed.  In the process of getting her prosthesis.  A new prescription given for physical therapy for prosthesis and ambulatory training.   [name], MD   08/08/2022 10:42 AM  PAIN:  Are you having pain? No.  Pt. Reports phantom limb symptoms but not pain.    PRECAUTIONS: Fall  RED FLAGS: None   WEIGHT BEARING RESTRICTIONS: No  FALLS:  Has patient fallen in last 6 months? No  LIVING ENVIRONMENT: Lives with: lives with their daughter  [name].   Lives in: House/apartment Stairs: No Has following equipment at home: Dan Humphreys - 2 wheeled, Wheelchair (manual), and Ramped entry  OCCUPATION: Retired  PLOF:  Independent.  Pt. Does not drive.    PATIENT GOALS: Ambulate with least assistive device.  Return home independently/ go back to church.    NEXT MD VISIT: [name] 7/31.  MD f/u in September?  OBJECTIVE:   PATIENT SURVEYS:  FOTO initial 29/ goal 46.    COGNITION: Overall cognitive status: Within functional limits for tasks assessed     SENSATION: WFL  EDEMA:  No R residual limb swelling noted.  PT discussed use of shrinker and proper use of ply socks for improved fit of prosthetic leg.    POSTURE: rounded shoulders and forward head  PALPATION: Minimal tenderness along R distal residual limb/ incision.  Good incision healing/ PT discussed benefits of scar massage.    LOWER EXTREMITY ROM:  Active ROM Right eval Left eval  Hip flexion [facility] WFL  Hip extension NT NT  Hip abduction [facility]  Hip adduction    Hip internal rotation    Hip external rotation    Knee flexion [facility] WFL  Knee extension -3 deg. 0 deg.  Ankle dorsiflexion    Ankle plantarflexion    Ankle inversion    Ankle eversion     (Blank rows = not tested)  LOWER EXTREMITY MMT:  MMT Right eval Left eval  Hip flexion 4/5 4/5  Hip extension    Hip abduction 3+/5 4/5  Hip adduction    Hip internal rotation    Hip external rotation    Knee flexion 4+/5  4+/5  Knee extension 4+/5 5/5  Ankle dorsiflexion    Ankle plantarflexion    Ankle inversion    Ankle eversion     (Blank rows = not tested)   B UE AROM WFL.  B UE strength grossly 4/5 MMT except bicep/tricep 4+/5 MMT  FUNCTIONAL TESTS:  5 times sit to stand: TBD Timed up and go (TUG): TBD 6 minute walk test: TBD  GAIT: Distance walked: 45 feet in gym with RW/ min. A for safety and cuing Assistive device utilized: Walker - 2 wheeled Level of assistance: Min A Comments: amb. In gym with use of RW/min A for cuing to increase step length/ heel strike.  Pt. Fearful while walking outside of //-bars.  Pt. Ambs. In //-bars with B UE assist and  increase cadence/ step pattern.  Use of mirror for posture correction/ heel strike (pt. Does not looking at a mirror).     TODAY'S TREATMENT:  DATE: 10/02/2022  Subjective: Pt. entered PT with use of RW and prosthetic leg donned.  Pt. Reports no pain in residual limb, just feelings of "tightness" in the front of the residual limb. Says she is planning to move back into her house next weekend. HEP still going well and she is walking everyday at home with her daughter.     Treatment:   Gait training:    //-bars: high marching/ lateral walking 3 laps each.  B UE assist.  Mirror feedback to improve posture/ consistent step pattern/ heel strike.    Walking in //-bars with step over (6" hurdles)- B UE assist (3 laps).  Cuing to correct upright posture and avoid hip circumduction.      Ambulated from Pt gym to car (~100 feet), cues for heel strike and increasing step length.  Use of RW and CGA for safety, No LOB during tx.     There.ex.:   Nustep level 4 (seat 10) x 10 minutes to improve cardiorespiratory endurance.   6" step ups on L/R with B UE assist on handrails: 1 x 10 each leg  Neuro Re'ed:  Rhomberg stance on firm ground: reaching outside BoS to grab cones, 2 x 12 grabs each arm - CGA for safety, occasional minor LoB with back row cones requiring UE support to prevent fall    Not Today Walking in //-bars with L UE only working on consistent 2-point gait pattern/ heel strike/ upright posture.  2 laps with SBA/CGA for safety and verbal cuing.  Seated LAQ/marching 20x.  Discussed importance of R knee extension.    Standing L/R hip abduction with light UE assist on handrails 10x2 each.   Amb. around gym with use of RW working on consistent recip. Step pattern.  Cuing to increase L LE step length.   Walking in //-bars with alt. Cone taps with B UE assist,  progress with use of L UE only.  2 laps/ challenged with R LE cone taps with L UE only.  No LOB.   PATIENT EDUCATION:  Education details: Discussed HEP/ standing hip ex. At [employer] Person educated: Patient and Child(ren) Education method: Medical illustrator Education comprehension: verbalized understanding and returned demonstration  HOME EXERCISE PROGRAM: Access Code: [email or web address] URL: [email or web address] Date: 08/30/2022 Prepared by: Maylon Peppers  Exercises - Sit to Stand with Counter Support  - 1 x daily - 5 x weekly - 3 sets - 10 reps - Standing Hip Abduction with Counter Support  - 1 x daily - 5 x weekly - 3 sets - 10 reps - Standing March with Counter Support  - 1 x daily - 5 x weekly - 3 sets - 10 reps - Standing Hip Extension with Counter Support  - 1 x daily - 5 x weekly - 3 sets - 10 reps  ASSESSMENT:  CLINICAL IMPRESSION:   Pt arrives to PT with no reports of pain in R LE. Pt complained of feelings of "looseness" in her prosthetic limb throughout the session; limb was removed and reapplied and pt was educated on use of extra sock ply to try and improve fit if these feelings continue. Today's session consisted of exercises to improve pt's standing/walking tolerance, as well as pt's static balance while reaching outside her BoS. Pt continues to tolerate walking exercises well with no pain in the residual limb. Patient will continue to benefit from skilled PT services to increase B LE muscle strength to improve independence/safety with prosthetic/gait training.    OBJECTIVE IMPAIRMENTS:  Abnormal gait, decreased activity tolerance, decreased balance, decreased endurance, decreased mobility, difficulty walking, decreased ROM, decreased strength, decreased safety awareness, improper body mechanics, postural dysfunction, and pain.   ACTIVITY LIMITATIONS: carrying, lifting, standing, squatting, stairs, transfers, bed mobility, bathing, toileting, dressing,  and locomotion level  PARTICIPATION LIMITATIONS: meal prep, cleaning, laundry, shopping, community activity, and church  PERSONAL FACTORS: Fitness and Past/current experiences are also affecting patient's functional outcome.   REHAB POTENTIAL: Good  CLINICAL DECISION MAKING: Evolving/moderate complexity  EVALUATION COMPLEXITY: Moderate   GOALS: Goals reviewed with patient? Yes  SHORT TERM GOALS: Target date: 09/18/22 Pt. Able to tolerate standing 10 minutes in kitchen with use of prosthetic leg to improve meal preparation.   Baseline:  limited standing tolerance Goal status: Partially met   LONG TERM GOALS: Target date: 10/09/22  Pt. Will increase FOTO to 46 to improve pain-free mobility.   Baseline: initial FOTO 29 Goal status: INITIAL  2.  Pt. Will increase B LE muscle strength 1/2 muscle grade to improve pain-free mobility/ walking.   Baseline:  See above Goal status: INITIAL  3.  Pt. Able to ambulate 200 feet with mod. I with least assistive device to improve return to home.   Baseline: amb. Short distances with RW/ min. A from PT.  Limited endurance Goal status: INITIAL   PLAN:  PT FREQUENCY: 2x/week  PT DURATION: 6 weeks  PLANNED INTERVENTIONS: Therapeutic exercises, Therapeutic activity, Neuromuscular re-education, Balance training, Gait training, Patient/Family education, Self Care, Joint mobilization, Stair training, Prosthetic training, DME instructions, Manual therapy, and Re-evaluation  PLAN FOR NEXT SESSION: Gait training in //-bars/ Trial walking on "ramp," and navigating curb outside for return home this weekend.   5xSTS/ check goals.    Nyriah Coote, SPT  Maylon Peppers, PT, DPT Physical Therapist - [facility]  10/02/22, 2:09 PM

## 2022-10-04 ENCOUNTER — Encounter: Payer: Self-pay | Admitting: Physical Therapy

## 2022-10-04 ENCOUNTER — Ambulatory Visit: Payer: Medicare Other | Admitting: Physical Therapy

## 2022-10-04 DIAGNOSIS — R269 Unspecified abnormalities of gait and mobility: Secondary | ICD-10-CM

## 2022-10-04 DIAGNOSIS — M6281 Muscle weakness (generalized): Secondary | ICD-10-CM

## 2022-10-04 DIAGNOSIS — Z89511 Acquired absence of right leg below knee: Secondary | ICD-10-CM | POA: Diagnosis not present

## 2022-10-04 NOTE — Therapy (Signed)
OUTPATIENT PHYSICAL THERAPY LOWER EXTREMITY TREATMENT Physical Therapy Progress Note  Dates of reporting period  08/28/22   to   10/04/22   Patient Name: Melanie Juarez MRN: [id number] DOB:[birth year], 77 y.o., female Today's Date: 10/04/2022  END OF SESSION:  PT End of Session - 10/04/22 1319     Visit Number 10    Number of Visits 12    Date for PT Re-Evaluation 10/09/22    PT Start Time 1305    PT Stop Time 1403    PT Time Calculation (min) 58 min    Equipment Utilized During Treatment Gait belt    Activity Tolerance Patient tolerated treatment well    Behavior During Therapy WFL for tasks assessed/performed              Past Medical History:  Diagnosis Date   Diabetes (HCC)    Hypertension    Past Surgical History:  Procedure Laterality Date   AMPUTATION Right 03/22/2022   Procedure: AMPUTATION BELOW KNEE-Guillotine;  Surgeon: Annice Needy, MD;  Location: [facility] OR[facility];  Service: Vascular;  Laterality: Right;   LOWER EXTREMITY ANGIOGRAPHY Right 03/21/2022   Procedure: Lower Extremity Angiography;  Surgeon: Annice Needy, MD;  Location: [facility] IN[facility] CV [facility];  Service: Cardiovascular;  Laterality: Right;   STUMP REVISION Right 03/26/2022   Procedure: BKA REVISION;  Surgeon: Annice Needy, MD;  Location: [facility] ORS;  Service: General;  Laterality: Right;   Patient Active Problem List   Diagnosis Date Noted   Hx of BKA, right (HCC) 08/08/2022   Lactic acidosis 03/21/2022   Obesity (BMI 30-39.9) 03/21/2022   PAD (peripheral artery disease) (HCC) 03/21/2022   Osteomyelitis (HCC) 03/19/2022   Diabetic infection of right foot (HCC) 03/19/2022   Essential hypertension 03/19/2022   Leukocytosis 03/19/2022   AKI (acute kidney injury) (HCC) 03/19/2022   Sepsis with acute renal failure without septic shock (HCC) 03/19/2022   Hyperlipidemia 05/17/2014   Type 2 diabetes mellitus (HCC) 05/17/2014   Anemia, unspecified 05/18/2013   Benign essential hypertension 05/18/2013   REFERRING  PROVIDER: Annice Needy, MD  REFERRING DIAG: s/p R BKA  THERAPY DIAG:  Hx of BKA, right (HCC)  Muscle weakness (generalized)  Gait difficulty  Rationale for Evaluation and Treatment: Rehabilitation  ONSET DATE: 03/22/22  SUBJECTIVE:   SUBJECTIVE STATEMENT: Pt. Had a sore on R foot resulting in R BKA on 03/22/22.  [name]  Pt. Wants to return to living independently.  Pt. Completing HEP from rehab center.  Pt. Has been unable to get into shower.    PERTINENT HISTORY: LORAINA LLANOS is a 77 y.o. (02/14/1945) female who presents with chief complaint of     Chief Complaint  Patient presents with   Venous Insufficiency  .   History of Present Illness: Patient returns today in follow up of her right BKA.  It is healed.  She is in the process of getting her prosthesis now.  She is doing well.  No pain.  No left leg ulceration or rest pain.    Assessment/Plan   PAD (peripheral artery disease) (HCC) Check left ABI on follow up visit in 3 months.    Type 2 diabetes mellitus (HCC) blood glucose control important in reducing the progression of atherosclerotic disease. Also, involved in wound healing. On appropriate medications.    Essential hypertension blood pressure control important in reducing the progression of atherosclerotic disease. On appropriate oral medications.  Hx of BKA, right (HCC) Healed.  In the process of getting her prosthesis.  A new prescription given for physical therapy for prosthesis and ambulatory training.   [name], MD   08/08/2022 10:42 AM  PAIN:  Are you having pain? No.  Pt. Reports phantom limb symptoms but not pain.    PRECAUTIONS: Fall  RED FLAGS: None   WEIGHT BEARING RESTRICTIONS: No  FALLS:  Has patient fallen in last 6 months? No  LIVING ENVIRONMENT: Lives with: lives with their daughter  [name].   Lives in: House/apartment Stairs: No Has following equipment at home: Dan Humphreys -  2 wheeled, Wheelchair (manual), and Ramped entry  OCCUPATION: Retired  PLOF: Independent.  Pt. Does not drive.    PATIENT GOALS: Ambulate with least assistive device.  Return home independently/ go back to church.    NEXT MD VISIT: [name] 7/31.  MD f/u in September?  OBJECTIVE:   PATIENT SURVEYS:  FOTO initial 29/ goal 10.    COGNITION: Overall cognitive status: Within functional limits for tasks assessed     SENSATION: WFL  EDEMA:  No R residual limb swelling noted.  PT discussed use of shrinker and proper use of ply socks for improved fit of prosthetic leg.    POSTURE: rounded shoulders and forward head  PALPATION: Minimal tenderness along R distal residual limb/ incision.  Good incision healing/ PT discussed benefits of scar massage.    LOWER EXTREMITY ROM:  Active ROM Right eval Left eval  Hip flexion [facility] WFL  Hip extension NT NT  Hip abduction [facility]  Hip adduction    Hip internal rotation    Hip external rotation    Knee flexion [facility] WFL  Knee extension -3 deg. 0 deg.  Ankle dorsiflexion    Ankle plantarflexion    Ankle inversion    Ankle eversion     (Blank rows = not tested)  LOWER EXTREMITY MMT:  MMT Right eval Left eval  Hip flexion 4/5 4/5  Hip extension    Hip abduction 3+/5 4/5  Hip adduction    Hip internal rotation    Hip external rotation    Knee flexion 4+/5  4+/5  Knee extension 4+/5 5/5  Ankle dorsiflexion    Ankle plantarflexion    Ankle inversion    Ankle eversion     (Blank rows = not tested)   B UE AROM WFL.  B UE strength grossly 4/5 MMT except bicep/tricep 4+/5 MMT  FUNCTIONAL TESTS:  5 times sit to stand: TBD Timed up and go (TUG): TBD 6 minute walk test: TBD  GAIT: Distance walked: 45 feet in gym with RW/ min. A for safety and cuing Assistive device utilized: Walker - 2 wheeled Level of assistance: Min A Comments: amb. In gym with use of RW/min A for cuing to increase step length/ heel strike.  Pt. Fearful  while walking outside of //-bars.  Pt. Ambs. In //-bars with B UE assist and increase cadence/ step pattern.  Use of mirror for posture correction/ heel strike (pt. Does not looking at a mirror).     TODAY'S TREATMENT:  DATE: 10/04/2022  Subjective: Pt. entered PT with use of RW and prosthetic leg donned.  Pt. Reports no pain in residual limb.  Pt. is moving back into her house tomorrow.  Pt. Is HEP still going well and she is walking everyday at home with her daughter.     Treatment:   Gait training:   Amb. From car at front of PT clinic outside to side door of PT gym with use of RW.  Pt. Ambulates with slow, controlled recip. Gait pattern with SBA/CGA for safety.  Cuing to increase L step length/ heel strike, esp. When clearing thresholds.    //-bars: high marching/ lateral walking 3 laps each.  B UE assist.  Mirror feedback to improve posture/ consistent step pattern/ heel strike.    Walking in //-bars with step over (6" hurdles)- B UE assist (3 laps).  Cuing to correct upright posture and avoid hip circumduction.      Ambulated from Pt gym to car (~100 feet), cues for heel strike and increasing step length.  Use of RW and CGA for safety, No LOB during tx.  Good clearance over threshold.    There.ex.:   5xSTS: 57.17 sec. (Use of UE on arm rests/ RW)  TUG: 1 min. 25.7 seconds with use of RW (SBA/CGA for safety).    6" step ups on L/R with B UE assist on handrails: 2 x 10 each leg  Functional reaching for cone in //-bars with no UE assist.     Not Today Nustep level 4 (seat 10) x 10 minutes to improve cardiorespiratory endurance.   Walking in //-bars with L UE only working on consistent 2-point gait pattern/ heel strike/ upright posture.  2 laps with SBA/CGA for safety and verbal cuing.  Seated LAQ/marching 20x.  Discussed importance of R knee extension.     Standing L/R hip abduction with light UE assist on handrails 10x2 each.   Amb. around gym with use of RW working on consistent recip. Step pattern.  Cuing to increase L LE step length.   Walking in //-bars with alt. Cone taps with B UE assist, progress with use of L UE only.  2 laps/ challenged with R LE cone taps with L UE only.  No LOB.     PATIENT EDUCATION:  Education details: Discussed HEP/ standing hip ex. At [employer] Person educated: Patient and Child(ren) Education method: Medical illustrator Education comprehension: verbalized understanding and returned demonstration  HOME EXERCISE PROGRAM: Access Code: [email or web address] URL: [email or web address] Date: 08/30/2022 Prepared by: Maylon Peppers  Exercises - Sit to Stand with Counter Support  - 1 x daily - 5 x weekly - 3 sets - 10 reps - Standing Hip Abduction with Counter Support  - 1 x daily - 5 x weekly - 3 sets - 10 reps - Standing March with Counter Support  - 1 x daily - 5 x weekly - 3 sets - 10 reps - Standing Hip Extension with Counter Support  - 1 x daily - 5 x weekly - 3 sets - 10 reps  ASSESSMENT:  CLINICAL IMPRESSION:   Pt arrives to PT with no reports of pain in R LE.  Today's session focused on outside walking/ consistent step pattern and reassessment of physical performance tasks.  Pt continues to tolerate walking exercises well with no pain in the residual limb. Patient will continue to benefit from skilled PT services to increase B LE muscle strength to improve independence/safety with prosthetic/gait training.    OBJECTIVE  IMPAIRMENTS: Abnormal gait, decreased activity tolerance, decreased balance, decreased endurance, decreased mobility, difficulty walking, decreased ROM, decreased strength, decreased safety awareness, improper body mechanics, postural dysfunction, and pain.   ACTIVITY LIMITATIONS: carrying, lifting, standing, squatting, stairs, transfers, bed mobility, bathing,  toileting, dressing, and locomotion level  PARTICIPATION LIMITATIONS: meal prep, cleaning, laundry, shopping, community activity, and church  PERSONAL FACTORS: Fitness and Past/current experiences are also affecting patient's functional outcome.   REHAB POTENTIAL: Good  CLINICAL DECISION MAKING: Evolving/moderate complexity  EVALUATION COMPLEXITY: Moderate   GOALS: Goals reviewed with patient? Yes  SHORT TERM GOALS: Target date: 09/18/22 Pt. Able to tolerate standing 10 minutes in kitchen with use of prosthetic leg to improve meal preparation.   Baseline:  limited standing tolerance Goal status: Goal met   LONG TERM GOALS: Target date: 10/09/22  Pt. Will increase FOTO to 46 to improve pain-free mobility.   Baseline: initial FOTO 29 Goal status: INITIAL  2.  Pt. Will increase B LE muscle strength 1/2 muscle grade to improve pain-free mobility/ walking.   Baseline:  See above Goal status: INITIAL  3.  Pt. Able to ambulate 200 feet with mod. I with least assistive device to improve return to home.   Baseline: amb. Short distances with RW/ min. A from PT.  Limited endurance Goal status: INITIAL   PLAN:  PT FREQUENCY: 2x/week  PT DURATION: 6 weeks  PLANNED INTERVENTIONS: Therapeutic exercises, Therapeutic activity, Neuromuscular re-education, Balance training, Gait training, Patient/Family education, Self Care, Joint mobilization, Stair training, Prosthetic training, DME instructions, Manual therapy, and Re-evaluation  PLAN FOR NEXT SESSION: Check FOTO/ LTGs   Timothy Crutchfield, SPT Cammie Mcgee, PT, DPT # [phone] Physical Therapist - [facility]  10/04/22, 3:39 PM

## 2022-10-09 ENCOUNTER — Encounter: Payer: Medicare Other | Admitting: Physical Therapy

## 2022-10-09 ENCOUNTER — Ambulatory Visit: Payer: Medicare Other

## 2022-10-09 ENCOUNTER — Encounter: Payer: Self-pay | Admitting: Physical Therapy

## 2022-10-09 DIAGNOSIS — Z89511 Acquired absence of right leg below knee: Secondary | ICD-10-CM

## 2022-10-09 DIAGNOSIS — R269 Unspecified abnormalities of gait and mobility: Secondary | ICD-10-CM

## 2022-10-09 DIAGNOSIS — M6281 Muscle weakness (generalized): Secondary | ICD-10-CM

## 2022-10-09 NOTE — Therapy (Signed)
OUTPATIENT PHYSICAL THERAPY LOWER EXTREMITY TREATMENT Physical Therapy Progress Note  Dates of reporting period  08/28/22   to   10/04/22   Patient Name: Melanie Juarez MRN: [id number] DOB:11-May-1945, 77 y.o., female Today's Date: 10/09/2022  END OF SESSION:  PT End of Session - 10/09/22 1425     Visit Number 11    Number of Visits 12    Date for PT Re-Evaluation 12/04/22    PT Start Time 1300    PT Stop Time 1350    PT Time Calculation (min) 50 min    Equipment Utilized During Treatment Gait belt    Activity Tolerance Patient tolerated treatment well    Behavior During Therapy WFL for tasks assessed/performed               Past Medical History:  Diagnosis Date   Diabetes (HCC)    Hypertension    Past Surgical History:  Procedure Laterality Date   AMPUTATION Right 03/22/2022   Procedure: AMPUTATION BELOW KNEE-Guillotine;  Surgeon: Annice Needy, MD;  Location: [facility] ORS;  Service: Vascular;  Laterality: Right;   LOWER EXTREMITY ANGIOGRAPHY Right 03/21/2022   Procedure: Lower Extremity Angiography;  Surgeon: Annice Needy, MD;  Location: [facility];  Service: Cardiovascular;  Laterality: Right;   STUMP REVISION Right 03/26/2022   Procedure: BKA REVISION;  Surgeon: Annice Needy, MD;  Location: [facility] OR[facility];  Service: General;  Laterality: Right;   Patient Active Problem List   Diagnosis Date Noted   Hx of BKA, right (HCC) 08/08/2022   Lactic acidosis 03/21/2022   Obesity (BMI 30-39.9) 03/21/2022   PAD (peripheral artery disease) (HCC) 03/21/2022   Osteomyelitis (HCC) 03/19/2022   Diabetic infection of right foot (HCC) 03/19/2022   Essential hypertension 03/19/2022   Leukocytosis 03/19/2022   AKI (acute kidney injury) (HCC) 03/19/2022   Sepsis with acute renal failure without septic shock (HCC) 03/19/2022   Hyperlipidemia 05/17/2014   Type 2 diabetes mellitus (HCC) 05/17/2014   Anemia, unspecified 05/18/2013   Benign essential hypertension 05/18/2013   REFERRING  PROVIDER: Annice Needy, MD  REFERRING DIAG: s/p R BKA  THERAPY DIAG:  Hx of BKA, right (HCC)  Muscle weakness (generalized)  Gait difficulty  Rationale for Evaluation and Treatment: Rehabilitation  ONSET DATE: 03/22/22  SUBJECTIVE:   SUBJECTIVE STATEMENT: Pt. Had a sore on R foot resulting in R BKA on 03/22/22.  [name]  Pt. Wants to return to living independently.  Pt. Completing HEP from rehab center.  Pt. Has been unable to get into shower.    PERTINENT HISTORY: Melanie Juarez is a 77 y.o. (Aug 04, 1945) female who presents with chief complaint of     Chief Complaint  Patient presents with   Venous Insufficiency  .   History of Present Illness: Patient returns today in follow up of her right BKA.  It is healed.  She is in the process of getting her prosthesis now.  She is doing well.  No pain.  No left leg ulceration or rest pain.    Assessment/Plan   PAD (peripheral artery disease) (HCC) Check left ABI on follow up visit in 3 months.    Type 2 diabetes mellitus (HCC) blood glucose control important in reducing the progression of atherosclerotic disease. Also, involved in wound healing. On appropriate medications.    Essential hypertension blood pressure control important in reducing the progression of atherosclerotic disease. On appropriate oral medications.  Hx of BKA, right (HCC) Healed.  In the process of getting her prosthesis.  A new prescription given for physical therapy for prosthesis and ambulatory training.   [name], MD   08/08/2022 10:42 AM  PAIN:  Are you having pain? No.  Pt. Reports phantom limb symptoms but not pain.    PRECAUTIONS: Fall  RED FLAGS: None   WEIGHT BEARING RESTRICTIONS: No  FALLS:  Has patient fallen in last 6 months? No  LIVING ENVIRONMENT: Lives with: lives with their daughter  [name].   Lives in: House/apartment Stairs: No Has following equipment at home: Dan Humphreys -  2 wheeled, Wheelchair (manual), and Ramped entry  OCCUPATION: Retired  PLOF: Independent.  Pt. Does not drive.    PATIENT GOALS: Ambulate with least assistive device.  Return home independently/ go back to church.    NEXT MD VISIT: [name] 7/31.  MD f/u in September?  OBJECTIVE:   PATIENT SURVEYS:  FOTO initial 29/ goal 44.    COGNITION: Overall cognitive status: Within functional limits for tasks assessed     SENSATION: WFL  EDEMA:  No R residual limb swelling noted.  PT discussed use of shrinker and proper use of ply socks for improved fit of prosthetic leg.    POSTURE: rounded shoulders and forward head  PALPATION: Minimal tenderness along R distal residual limb/ incision.  Good incision healing/ PT discussed benefits of scar massage.    LOWER EXTREMITY ROM:  Active ROM Right eval Left eval  Hip flexion [facility] WFL  Hip extension NT NT  Hip abduction [facility]  Hip adduction    Hip internal rotation    Hip external rotation    Knee flexion [facility] WFL  Knee extension -3 deg. 0 deg.  Ankle dorsiflexion    Ankle plantarflexion    Ankle inversion    Ankle eversion     (Blank rows = not tested)  LOWER EXTREMITY MMT:  MMT Right eval Left eval  Hip flexion 4/5 4/5  Hip extension    Hip abduction 3+/5 4/5  Hip adduction    Hip internal rotation    Hip external rotation    Knee flexion 4+/5  4+/5  Knee extension 4+/5 5/5  Ankle dorsiflexion    Ankle plantarflexion    Ankle inversion    Ankle eversion     (Blank rows = not tested)   B UE AROM WFL.  B UE strength grossly 4/5 MMT except bicep/tricep 4+/5 MMT  FUNCTIONAL TESTS:  5 times sit to stand: TBD Timed up and go (TUG): TBD 6 minute walk test: TBD  GAIT: Distance walked: 45 feet in gym with RW/ min. A for safety and cuing Assistive device utilized: Walker - 2 wheeled Level of assistance: Min A Comments: amb. In gym with use of RW/min A for cuing to increase step length/ heel strike.  Pt. Fearful  while walking outside of //-bars.  Pt. Ambs. In //-bars with B UE assist and increase cadence/ step pattern.  Use of mirror for posture correction/ heel strike (pt. Does not looking at a mirror).     TODAY'S TREATMENT:  DATE: 10/09/2022  Subjective: Pt. entered PT with use of RW and prosthetic leg donned.  Pt reports 3-4/10 pain/soreness in her R residual limb mainly with weightbearing.   Treatment:   FOTO: 29 initial, 51 today  : 1.5 laps  MMT:   MMT Right 10/09/2022 Left 10/09/2022  Hip flexion 4+ 4+  Hip abduction 4 4   Hip adduction    Hip internal rotation    Hip external rotation    Knee flexion 5 4+  Knee extension 4+ 4+  Ankle dorsiflexion    Ankle plantarflexion    Ankle inversion    Ankle eversion    (Blank rows = not tested)  Gait training:      Walking in //-bars with step over (1 foot hurdles)- B UE assist, 3 laps each of fwd/lateral stepping  Ambulated to/from Pt gym to car (~100 feet x 2), cues for heel strike and increasing step length.  Use of RW and CGA for safety, No LOB during tx.  Good clearance over threshold.    There.ex.:  Nustep level 4 (seat 10) x 10 minutes to improve cardiorespiratory endurance.   : 105 feet with use of RW, pt requested seated rest break after 3 minutes secondary to generalized fatigue   Not Today //-bars: high marching/ lateral walking 3 laps each.  B UE assist.  Mirror feedback to improve posture/ consistent step pattern/ heel strike.   Walking in //-bars with L UE only working on consistent 2-point gait pattern/ heel strike/ upright posture.  2 laps with SBA/CGA for safety and verbal cuing.  Seated LAQ/marching 20x.  Discussed importance of R knee extension.    Standing L/R hip abduction with light UE assist on handrails 10x2 each.   Amb. around gym with use of RW working on consistent  recip. Step pattern.  Cuing to increase L LE step length.   Walking in //-bars with alt. Cone taps with B UE assist, progress with use of L UE only.  2 laps/ challenged with R LE cone taps with L UE only.  No LOB.    6" step ups on L/R with B UE assist on handrails: 2 x 10 each leg  Functional reaching for cone in //-bars with no UE assist.    PATIENT EDUCATION:  Education details: Discussed HEP/ standing hip ex. At [employer] Person educated: Patient and Child(ren) Education method: Medical illustrator Education comprehension: verbalized understanding and returned demonstration  HOME EXERCISE PROGRAM: Access Code: [email or web address] URL: [email or web address] Date: 08/30/2022 Prepared by: Maylon Peppers  Exercises - Sit to Stand with Counter Support  - 1 x daily - 5 x weekly - 3 sets - 10 reps - Standing Hip Abduction with Counter Support  - 1 x daily - 5 x weekly - 3 sets - 10 reps - Standing March with Counter Support  - 1 x daily - 5 x weekly - 3 sets - 10 reps - Standing Hip Extension with Counter Support  - 1 x daily - 5 x weekly - 3 sets - 10 reps  ASSESSMENT:  CLINICAL IMPRESSION:   Pt arrives to PT with reports of increased pain/soreness in her R residual limb. Pt demonstrated improvement in FOTO score from initial evaluation. Pt able to ambulate ~125 feet continuously with RW today between walking from Nustep to hallway to perform . The rest of the session consisted of gait training in // bars to improve walking tolerance and work on reciprocal gait while navigating objects. Pt requested several  seated rest breaks throughout the session. Patient will continue to benefit from skilled PT services to increase B LE muscle strength to improve independence/safety with prosthetic/gait training.    OBJECTIVE IMPAIRMENTS: Abnormal gait, decreased activity tolerance, decreased balance, decreased endurance, decreased mobility, difficulty walking, decreased ROM,  decreased strength, decreased safety awareness, improper body mechanics, postural dysfunction, and pain.   ACTIVITY LIMITATIONS: carrying, lifting, standing, squatting, stairs, transfers, bed mobility, bathing, toileting, dressing, and locomotion level  PARTICIPATION LIMITATIONS: meal prep, cleaning, laundry, shopping, community activity, and church  PERSONAL FACTORS: Fitness and Past/current experiences are also affecting patient's functional outcome.   REHAB POTENTIAL: Good  CLINICAL DECISION MAKING: Evolving/moderate complexity  EVALUATION COMPLEXITY: Moderate   GOALS: Goals reviewed with patient? Yes  SHORT TERM GOALS: Target date: 09/18/22 Pt. Able to tolerate standing 10 minutes in kitchen with use of prosthetic leg to improve meal preparation.   Baseline:  limited standing tolerance Goal status: Goal met   LONG TERM GOALS: Target date: 12/04/22  Pt. Will increase FOTO to 46 to improve pain-free mobility.   Baseline: initial FOTO 29 Goal status: GOAL MET 10/09/22 (51)  2.  Pt. Will increase B LE muscle strength 1/2 muscle grade to improve pain-free mobility/ walking.   Baseline:  See above Goal status: Partially met (see above for updated MMT)  3.  Pt. Able to ambulate 200 feet with mod. I with least assistive device to improve return to home.   Baseline: amb. Short distances with RW/ min. A from PT.  Limited endurance Goal status: Partially met (pt able to ambulate ~125 feet continuously with RW 10/09/2022)   PLAN:  PT FREQUENCY: 2x/week  PT DURATION: 6 weeks  PLANNED INTERVENTIONS: Therapeutic exercises, Therapeutic activity, Neuromuscular re-education, Balance training, Gait training, Patient/Family education, Self Care, Joint mobilization, Stair training, Prosthetic training, DME instructions, Manual therapy, and Re-evaluation  PLAN FOR NEXT SESSION: Progress gait training with unilateral UE support as tolerated, progress static/dynamic balance  exercises  Jaylea Plourde, SPT  Maylon Peppers, PT, DPT Physical Therapist - [facility]  10/09/22, 2:29 PM

## 2022-10-10 ENCOUNTER — Encounter: Payer: Medicare Other | Admitting: Physical Therapy

## 2022-10-11 ENCOUNTER — Encounter: Payer: Medicare Other | Admitting: Physical Therapy

## 2022-10-11 ENCOUNTER — Encounter: Payer: Self-pay | Admitting: Physical Therapy

## 2022-10-11 ENCOUNTER — Ambulatory Visit: Payer: Medicare Other

## 2022-10-11 DIAGNOSIS — Z89511 Acquired absence of right leg below knee: Secondary | ICD-10-CM

## 2022-10-11 DIAGNOSIS — R269 Unspecified abnormalities of gait and mobility: Secondary | ICD-10-CM

## 2022-10-11 DIAGNOSIS — M6281 Muscle weakness (generalized): Secondary | ICD-10-CM

## 2022-10-11 NOTE — Therapy (Signed)
OUTPATIENT PHYSICAL THERAPY LOWER EXTREMITY TREATMENT  Patient Name: Melanie Juarez MRN: [id number] DOB:Jan 09, 1946, 77 y.o., female Today's Date: 10/11/2022  END OF SESSION:  PT End of Session - 10/11/22 1304     Visit Number 12    Number of Visits 28    Date for PT Re-Evaluation 12/04/22    PT Start Time 1255    PT Stop Time 1345    PT Time Calculation (min) 50 min    Equipment Utilized During Treatment Gait belt    Activity Tolerance Patient tolerated treatment well    Behavior During Therapy WFL for tasks assessed/performed                Past Medical History:  Diagnosis Date   Diabetes (HCC)    Hypertension    Past Surgical History:  Procedure Laterality Date   AMPUTATION Right 03/22/2022   Procedure: AMPUTATION BELOW KNEE-Guillotine;  Surgeon: Annice Needy, MD;  Location: [facility] ORS;  Service: Vascular;  Laterality: Right;   LOWER EXTREMITY ANGIOGRAPHY Right 03/21/2022   Procedure: Lower Extremity Angiography;  Surgeon: Annice Needy, MD;  Location: [facility];  Service: Cardiovascular;  Laterality: Right;   STUMP REVISION Right 03/26/2022   Procedure: BKA REVISION;  Surgeon: Annice Needy, MD;  Location: [facility] ORS;  Service: General;  Laterality: Right;   Patient Active Problem List   Diagnosis Date Noted   Hx of BKA, right (HCC) 08/08/2022   Lactic acidosis 03/21/2022   Obesity (BMI 30-39.9) 03/21/2022   PAD (peripheral artery disease) (HCC) 03/21/2022   Osteomyelitis (HCC) 03/19/2022   Diabetic infection of right foot (HCC) 03/19/2022   Essential hypertension 03/19/2022   Leukocytosis 03/19/2022   AKI (acute kidney injury) (HCC) 03/19/2022   Sepsis with acute renal failure without septic shock (HCC) 03/19/2022   Hyperlipidemia 05/17/2014   Type 2 diabetes mellitus (HCC) 05/17/2014   Anemia, unspecified 05/18/2013   Benign essential hypertension 05/18/2013   REFERRING PROVIDER: Annice Needy, MD  REFERRING DIAG: s/p R BKA  THERAPY DIAG:  Hx of BKA,  right (HCC)  Muscle weakness (generalized)  Gait difficulty  Rationale for Evaluation and Treatment: Rehabilitation  ONSET DATE: 03/22/22  SUBJECTIVE:   SUBJECTIVE STATEMENT: Pt. Had a sore on R foot resulting in R BKA on 03/22/22.  Pt. Entered Pt with use of w/c and daughter Melanie Juarez) present.  Pt. Wants to return to living independently.  Pt. Completing HEP from rehab center.  Pt. Has been unable to get into shower.    PERTINENT HISTORY: Melanie Juarez is a 77 y.o. (Jul 12, 1945) female who presents with chief complaint of     Chief Complaint  Patient presents with   Venous Insufficiency  .   History of Present Illness: Patient returns today in follow up of her right BKA.  It is healed.  She is in the process of getting her prosthesis now.  She is doing well.  No pain.  No left leg ulceration or rest pain.    Assessment/Plan   PAD (peripheral artery disease) (HCC) Check left ABI on follow up visit in 3 months.    Type 2 diabetes mellitus (HCC) blood glucose control important in reducing the progression of atherosclerotic disease. Also, involved in wound healing. On appropriate medications.    Essential hypertension blood pressure control important in reducing the progression of atherosclerotic disease. On appropriate oral medications.    Hx of BKA, right (HCC) Healed.  In the process of getting her prosthesis.  A new prescription given for physical therapy for prosthesis and ambulatory training.   [name], MD   08/08/2022 10:42 AM  PAIN:  Are you having pain? No.  Pt. Reports phantom limb symptoms but not pain.    PRECAUTIONS: Fall  RED FLAGS: None   WEIGHT BEARING RESTRICTIONS: No  FALLS:  Has patient fallen in last 6 months? No  LIVING ENVIRONMENT: Lives with: lives with their daughter  Melanie Juarez).   Lives in: House/apartment Stairs: No Has following equipment at home: Dan Humphreys - 2 wheeled, Wheelchair (manual), and Ramped entry  OCCUPATION: Retired  PLOF:  Independent.  Pt. Does not drive.    PATIENT GOALS: Ambulate with least assistive device.  Return home independently/ go back to church.    NEXT MD VISIT: [name] 7/31.  MD f/u in September?  OBJECTIVE:   PATIENT SURVEYS:  FOTO initial 29/ goal 90.    COGNITION: Overall cognitive status: Within functional limits for tasks assessed     SENSATION: WFL  EDEMA:  No R residual limb swelling noted.  PT discussed use of shrinker and proper use of ply socks for improved fit of prosthetic leg.    POSTURE: rounded shoulders and forward head  PALPATION: Minimal tenderness along R distal residual limb/ incision.  Good incision healing/ PT discussed benefits of scar massage.    LOWER EXTREMITY ROM:  Active ROM Right eval Left eval  Hip flexion [facility] WFL  Hip extension NT NT  Hip abduction [facility]  Hip adduction    Hip internal rotation    Hip external rotation    Knee flexion [facility] WFL  Knee extension -3 deg. 0 deg.  Ankle dorsiflexion    Ankle plantarflexion    Ankle inversion    Ankle eversion     (Blank rows = not tested)  LOWER EXTREMITY MMT:  MMT Right eval Left eval  Hip flexion 4/5 4/5  Hip extension    Hip abduction 3+/5 4/5  Hip adduction    Hip internal rotation    Hip external rotation    Knee flexion 4+/5  4+/5  Knee extension 4+/5 5/5  Ankle dorsiflexion    Ankle plantarflexion    Ankle inversion    Ankle eversion     (Blank rows = not tested)   B UE AROM WFL.  B UE strength grossly 4/5 MMT except bicep/tricep 4+/5 MMT  FUNCTIONAL TESTS:  5 times sit to stand: TBD Timed up and go (TUG): TBD 6 minute walk test: TBD  GAIT: Distance walked: 45 feet in gym with RW/ min. A for safety and cuing Assistive device utilized: Walker - 2 wheeled Level of assistance: Min A Comments: amb. In gym with use of RW/min A for cuing to increase step length/ heel strike.  Pt. Fearful while walking outside of //-bars.  Pt. Ambs. In //-bars with B UE assist and  increase cadence/ step pattern.  Use of mirror for posture correction/ heel strike (pt. Does not looking at a mirror).     TODAY'S TREATMENT:  DATE: 10/11/2022  Subjective: Pt. entered PT with use of RW and prosthetic leg donned.  Pt reports having some phantom limb pain/sensation on her residual side. She says she is able to walk 2x a day on her ramp at her house.   Treatment:  Sitting BP: 158/64, 97% O2, 82 HR  Gait training:   Walking in //-bars with step over (1 foot hurdles)- L UE assist, 4 laps each of fwd stepping with each foot  Walking in //-bars with L UE only working on consistent 2-point gait pattern/ heel strike/ upright posture, added reaching to grab cone mid-stride with R UE .  3 laps with SBA/CGA for safety and verbal cuing.   Ambulated to/from Pt gym to car (~100 feet x 2), cues for heel strike and increasing step length.  Use of RW and CGA for safety, No LOB during tx.  Good clearance over threshold.    There.ex.:  Nustep level 5  (seat 10) x 10 minutes to improve cardiorespiratory endurance.   6" step ups on L/R with B UE assist on handrails: 1 x 10 each leg  Neuro Re'ed: Narrow Stance in // bars with functional reach: reaching for cones forward, 2 x 8 reaches each arm in different directions - No UE support, CGA for safety  Not Today //-bars: high marching/ lateral walking 3 laps each.  B UE assist.  Mirror feedback to improve posture/ consistent step pattern/ heel strike.   Seated LAQ/marching 20x.  Discussed importance of R knee extension.    Standing L/R hip abduction with light UE assist on handrails 10x2 each.   Amb. around gym with use of RW working on consistent recip. Step pattern.  Cuing to increase L LE step length.   Walking in //-bars with alt. Cone taps with B UE assist, progress with use of L UE only.  2 laps/ challenged with  R LE cone taps with L UE only.  No LOB.    PATIENT EDUCATION:  Education details: Discussed HEP/ standing hip ex. At [employer] Person educated: Patient and Child(ren) Education method: Medical illustrator Education comprehension: verbalized understanding and returned demonstration  HOME EXERCISE PROGRAM: Access Code: [email or web address] URL: [email or web address] Date: 08/30/2022 Prepared by: Maylon Peppers  Exercises - Sit to Stand with Counter Support  - 1 x daily - 5 x weekly - 3 sets - 10 reps - Standing Hip Abduction with Counter Support  - 1 x daily - 5 x weekly - 3 sets - 10 reps - Standing March with Counter Support  - 1 x daily - 5 x weekly - 3 sets - 10 reps - Standing Hip Extension with Counter Support  - 1 x daily - 5 x weekly - 3 sets - 10 reps  ASSESSMENT:  CLINICAL IMPRESSION:   Pt arrives to PT with reports of mild phantom limb pain in her R LE. Today's session focused on progressing gait training exercises to work on ambulating with unilateral UE support to simulate cane use. Pt demonstrated good reciprocal pattern with single UE support and good heel-strike bilaterally; verbal cues required occasionally to increase step length with LLE. Static balance exercise performed at end of session to improve functional reach and stability with moving outside BoS. CGA throughout all exercises for safety. Patient will continue to benefit from skilled PT services to increase B LE muscle strength to improve independence/safety with prosthetic/gait training.    OBJECTIVE IMPAIRMENTS: Abnormal gait, decreased activity tolerance, decreased balance, decreased endurance, decreased mobility, difficulty walking,  decreased ROM, decreased strength, decreased safety awareness, improper body mechanics, postural dysfunction, and pain.   ACTIVITY LIMITATIONS: carrying, lifting, standing, squatting, stairs, transfers, bed mobility, bathing, toileting, dressing, and locomotion  level  PARTICIPATION LIMITATIONS: meal prep, cleaning, laundry, shopping, community activity, and church  PERSONAL FACTORS: Fitness and Past/current experiences are also affecting patient's functional outcome.   REHAB POTENTIAL: Good  CLINICAL DECISION MAKING: Evolving/moderate complexity  EVALUATION COMPLEXITY: Moderate   GOALS: Goals reviewed with patient? Yes  SHORT TERM GOALS: Target date: 09/18/22 Pt. Able to tolerate standing 10 minutes in kitchen with use of prosthetic leg to improve meal preparation.   Baseline:  limited standing tolerance Goal status: Goal met   LONG TERM GOALS: Target date: 12/04/22  Pt. Will increase FOTO to 46 to improve pain-free mobility.   Baseline: initial FOTO 29 Goal status: GOAL MET 10/09/22 (51)  2.  Pt. Will increase B LE muscle strength 1/2 muscle grade to improve pain-free mobility/ walking.   Baseline:  See above Goal status: Partially met (see above for updated MMT)  3.  Pt. Able to ambulate 200 feet with mod. I with least assistive device to improve return to home.   Baseline: amb. Short distances with RW/ min. A from PT.  Limited endurance Goal status: Partially met (pt able to ambulate ~125 feet continuously with RW 10/09/2022)   PLAN:  PT FREQUENCY: 2x/week  PT DURATION: 6 weeks  PLANNED INTERVENTIONS: Therapeutic exercises, Therapeutic activity, Neuromuscular re-education, Balance training, Gait training, Patient/Family education, Self Care, Joint mobilization, Stair training, Prosthetic training, DME instructions, Manual therapy, and Re-evaluation  PLAN FOR NEXT SESSION: Progress gait training with unilateral UE support as tolerated, progress static/dynamic balance exercises  Harmoni Lucus, SPT  Maylon Peppers, PT, DPT Physical Therapist - [facility] 10/11/22, 2:07 PM

## 2022-10-11 NOTE — Addendum Note (Signed)
Addended by: Maylon Peppers A on: 10/11/2022 02:07 PM   Modules accepted: Orders

## 2022-10-16 ENCOUNTER — Ambulatory Visit: Payer: Medicare Other | Attending: Vascular Surgery

## 2022-10-16 ENCOUNTER — Encounter: Payer: Self-pay | Admitting: Physical Therapy

## 2022-10-16 DIAGNOSIS — R269 Unspecified abnormalities of gait and mobility: Secondary | ICD-10-CM | POA: Insufficient documentation

## 2022-10-16 DIAGNOSIS — Z89511 Acquired absence of right leg below knee: Secondary | ICD-10-CM | POA: Diagnosis present

## 2022-10-16 DIAGNOSIS — M6281 Muscle weakness (generalized): Secondary | ICD-10-CM | POA: Diagnosis present

## 2022-10-16 NOTE — Therapy (Signed)
OUTPATIENT PHYSICAL THERAPY LOWER EXTREMITY TREATMENT  Patient Name: Melanie Juarez MRN: [id number] DOB:[birth year], 77 y.o., female Today's Date: 10/16/2022  END OF SESSION:  PT End of Session - 10/16/22 1304     Visit Number 13    Number of Visits 28    Date for PT Re-Evaluation 12/04/22    PT Start Time 1300    PT Stop Time 1348    PT Time Calculation (min) 48 min    Equipment Utilized During Treatment Gait belt    Activity Tolerance Patient tolerated treatment well    Behavior During Therapy WFL for tasks assessed/performed                 Past Medical History:  Diagnosis Date   Diabetes (HCC)    Hypertension    Past Surgical History:  Procedure Laterality Date   AMPUTATION Right 03/22/2022   Procedure: AMPUTATION BELOW KNEE-Guillotine;  Surgeon: Annice Needy, MD;  Location: [facility] OR[facility];  Service: Vascular;  Laterality: Right;   LOWER EXTREMITY ANGIOGRAPHY Right 03/21/2022   Procedure: Lower Extremity Angiography;  Surgeon: Annice Needy, MD;  Location: [facility];  Service: Cardiovascular;  Laterality: Right;   STUMP REVISION Right 03/26/2022   Procedure: BKA REVISION;  Surgeon: Annice Needy, MD;  Location: [facility] OR[facility];  Service: General;  Laterality: Right;   Patient Active Problem List   Diagnosis Date Noted   Hx of BKA, right (HCC) 08/08/2022   Lactic acidosis 03/21/2022   Obesity (BMI 30-39.9) 03/21/2022   PAD (peripheral artery disease) (HCC) 03/21/2022   Osteomyelitis (HCC) 03/19/2022   Diabetic infection of right foot (HCC) 03/19/2022   Essential hypertension 03/19/2022   Leukocytosis 03/19/2022   AKI (acute kidney injury) (HCC) 03/19/2022   Sepsis with acute renal failure without septic shock (HCC) 03/19/2022   Hyperlipidemia 05/17/2014   Type 2 diabetes mellitus (HCC) 05/17/2014   Anemia, unspecified 05/18/2013   Benign essential hypertension 05/18/2013   REFERRING PROVIDER: Annice Needy, MD  REFERRING DIAG: s/p R BKA  THERAPY DIAG:  Hx of BKA,  right (HCC)  Muscle weakness (generalized)  Gait difficulty  Rationale for Evaluation and Treatment: Rehabilitation  ONSET DATE: 03/22/22  SUBJECTIVE:   SUBJECTIVE STATEMENT: Pt. Had a sore on R foot resulting in R BKA on 03/22/22.  [name]  Pt. Wants to return to living independently.  Pt. Completing HEP from rehab center.  Pt. Has been unable to get into shower.    PERTINENT HISTORY: Melanie Juarez is a 77 y.o. ([birth year]) female who presents with chief complaint of     Chief Complaint  Patient presents with   Venous Insufficiency  .   History of Present Illness: Patient returns today in follow up of her right BKA.  It is healed.  She is in the process of getting her prosthesis now.  She is doing well.  No pain.  No left leg ulceration or rest pain.    Assessment/Plan   PAD (peripheral artery disease) (HCC) Check left ABI on follow up visit in 3 months.    Type 2 diabetes mellitus (HCC) blood glucose control important in reducing the progression of atherosclerotic disease. Also, involved in wound healing. On appropriate medications.    Essential hypertension blood pressure control important in reducing the progression of atherosclerotic disease. On appropriate oral medications.    Hx of BKA, right (HCC) Healed.  In the process of getting her prosthesis.  A new prescription given for physical therapy for prosthesis and ambulatory training.   [name], MD   08/08/2022 10:42 AM  PAIN:  Are you having pain? No.  Pt. Reports phantom limb symptoms but not pain.    PRECAUTIONS: Fall  RED FLAGS: None   WEIGHT BEARING RESTRICTIONS: No  FALLS:  Has patient fallen in last 6 months? No  LIVING ENVIRONMENT: Lives with: lives with their daughter  [name].   Lives in: House/apartment Stairs: No Has following equipment at home: Dan Humphreys - 2 wheeled, Wheelchair (manual), and Ramped entry  OCCUPATION: Retired  PLOF:  Independent.  Pt. Does not drive.    PATIENT GOALS: Ambulate with least assistive device.  Return home independently/ go back to church.    NEXT MD VISIT: [name] 7/31.  MD f/u in September?  OBJECTIVE:   PATIENT SURVEYS:  FOTO initial 29/ goal 53.    COGNITION: Overall cognitive status: Within functional limits for tasks assessed     SENSATION: WFL  EDEMA:  No R residual limb swelling noted.  PT discussed use of shrinker and proper use of ply socks for improved fit of prosthetic leg.    POSTURE: rounded shoulders and forward head  PALPATION: Minimal tenderness along R distal residual limb/ incision.  Good incision healing/ PT discussed benefits of scar massage.    LOWER EXTREMITY ROM:  Active ROM Right eval Left eval  Hip flexion [facility] WFL  Hip extension NT NT  Hip abduction [facility]  Hip adduction    Hip internal rotation    Hip external rotation    Knee flexion [facility] WFL  Knee extension -3 deg. 0 deg.  Ankle dorsiflexion    Ankle plantarflexion    Ankle inversion    Ankle eversion     (Blank rows = not tested)  LOWER EXTREMITY MMT:  MMT Right eval Left eval  Hip flexion 4/5 4/5  Hip extension    Hip abduction 3+/5 4/5  Hip adduction    Hip internal rotation    Hip external rotation    Knee flexion 4+/5  4+/5  Knee extension 4+/5 5/5  Ankle dorsiflexion    Ankle plantarflexion    Ankle inversion    Ankle eversion     (Blank rows = not tested)   B UE AROM WFL.  B UE strength grossly 4/5 MMT except bicep/tricep 4+/5 MMT  FUNCTIONAL TESTS:  5 times sit to stand: TBD Timed up and go (TUG): TBD 6 minute walk test: TBD  GAIT: Distance walked: 45 feet in gym with RW/ min. A for safety and cuing Assistive device utilized: Walker - 2 wheeled Level of assistance: Min A Comments: amb. In gym with use of RW/min A for cuing to increase step length/ heel strike.  Pt. Fearful while walking outside of //-bars.  Pt. Ambs. In //-bars with B UE assist and  increase cadence/ step pattern.  Use of mirror for posture correction/ heel strike (pt. Does not looking at a mirror).     TODAY'S TREATMENT:  DATE: 10/16/2022  Subjective: Pt. entered PT with use of RW and prosthetic leg donned.  Pt reports no pain this afternoon. She says she has been able to do more walking at home and has been practicing walking with single UE support on her ramp or kitchen counter.   Treatment:   Gait training:  Static hip extension in standing in // bars: 3x1 minute, followed by fwd/back walking in // bars, then with RW in clinic for functional movement carry-over  Walking in //-bars with step over (6 inch hurdles)- L UE assist, 4 laps each of fwd stepping with each foot  Walking in //-bars with L UE only working on consistent 2-point gait pattern/ heel strike/ upright posture, added reaching to grab cone mid-stride with R UE .  3 laps with SBA/CGA for safety and verbal cuing.   Ambulated to/from Pt gym to car (~50 feet x 2), cues for heel strike and increasing step length.  Use of RW and CGA for safety, No LOB during tx.  //-bars: high marching/ lateral walking 3 laps each.  B UE assist.  Mirror feedback to improve posture/ consistent step pattern/ heel strike.    There.ex.:  Nustep level 5 (seat 10) x 10 minutes to improve cardiorespiratory endurance.   Neuro Re'ed: (none today) Narrow Stance in // bars with functional reach: reaching for cones forward, 2 x 8 reaches each arm in different directions - No UE support, CGA for safety  Not Today 6" step ups on L/R with B UE assist on handrails: 1 x 10 each leg Seated LAQ/marching 20x.  Discussed importance of R knee extension.    Standing L/R hip abduction with light UE assist on handrails 10x2 each.   Amb. around gym with use of RW working on consistent recip. Step pattern.  Cuing to increase  L LE step length.   Walking in //-bars with alt. Cone taps with B UE assist, progress with use of L UE only.  2 laps/ challenged with R LE cone taps with L UE only.  No LOB.    PATIENT EDUCATION:  Education details: Discussed HEP/ standing hip ex. At [employer] Person educated: Patient and Child(ren) Education method: Medical illustrator Education comprehension: verbalized understanding and returned demonstration  HOME EXERCISE PROGRAM: Access Code: [email or web address] URL: [email or web address] Date: 08/30/2022 Prepared by: Maylon Peppers  Exercises - Sit to Stand with Counter Support  - 1 x daily - 5 x weekly - 3 sets - 10 reps - Standing Hip Abduction with Counter Support  - 1 x daily - 5 x weekly - 3 sets - 10 reps - Standing March with Counter Support  - 1 x daily - 5 x weekly - 3 sets - 10 reps - Standing Hip Extension with Counter Support  - 1 x daily - 5 x weekly - 3 sets - 10 reps  ASSESSMENT:  CLINICAL IMPRESSION:   Pt arrives to PT with no reports of pain in her R LE. Session today consisted of gait training in // bars with goal of progressing all exercises to using just unilateral UE support to simulate use of SPC. Pt requires occasional cueing for heel-strike on the R LE, as well as to increase step-length on the LLE. Pt demonstrates decreased hip extension on the R side with ambulation. Pt educated and walked through static hip extension exercise in order to introduce proper motor control and muscle activation for carry-over into gait mechanics; pt demonstrated more reciprocal and more fluid gait pattern  in the clinic afterward. Pt requires CGA throughout all exercises for safety. Patient will continue to benefit from skilled PT services to increase B LE muscle strength to improve independence/safety with prosthetic/gait training.    OBJECTIVE IMPAIRMENTS: Abnormal gait, decreased activity tolerance, decreased balance, decreased endurance, decreased mobility,  difficulty walking, decreased ROM, decreased strength, decreased safety awareness, improper body mechanics, postural dysfunction, and pain.   ACTIVITY LIMITATIONS: carrying, lifting, standing, squatting, stairs, transfers, bed mobility, bathing, toileting, dressing, and locomotion level  PARTICIPATION LIMITATIONS: meal prep, cleaning, laundry, shopping, community activity, and church  PERSONAL FACTORS: Fitness and Past/current experiences are also affecting patient's functional outcome.   REHAB POTENTIAL: Good  CLINICAL DECISION MAKING: Evolving/moderate complexity  EVALUATION COMPLEXITY: Moderate   GOALS: Goals reviewed with patient? Yes  SHORT TERM GOALS: Target date: 09/18/22 Pt. Able to tolerate standing 10 minutes in kitchen with use of prosthetic leg to improve meal preparation.   Baseline:  limited standing tolerance Goal status: Goal met   LONG TERM GOALS: Target date: 12/04/22  Pt. Will increase FOTO to 46 to improve pain-free mobility.   Baseline: initial FOTO 29 Goal status: GOAL MET 10/09/22 (51)  2.  Pt. Will increase B LE muscle strength 1/2 muscle grade to improve pain-free mobility/ walking.   Baseline:  See above Goal status: Partially met (see above for updated MMT)  3.  Pt. Able to ambulate 200 feet with mod. I with least assistive device to improve return to home.   Baseline: amb. Short distances with RW/ min. A from PT.  Limited endurance Goal status: Partially met (pt able to ambulate ~125 feet continuously with RW 10/09/2022)   PLAN:  PT FREQUENCY: 2x/week  PT DURATION: 6 weeks  PLANNED INTERVENTIONS: Therapeutic exercises, Therapeutic activity, Neuromuscular re-education, Balance training, Gait training, Patient/Family education, Self Care, Joint mobilization, Stair training, Prosthetic training, DME instructions, Manual therapy, and Re-evaluation  PLAN FOR NEXT SESSION: Progress gait training with unilateral UE support as tolerated, progress  static/dynamic balance exercises, reassess hip extension with gait/hip extension repeats  Silva Aamodt, SPT 10/16/22, 5:41 PM

## 2022-10-18 ENCOUNTER — Ambulatory Visit: Payer: Medicare Other

## 2022-10-18 DIAGNOSIS — R269 Unspecified abnormalities of gait and mobility: Secondary | ICD-10-CM

## 2022-10-18 DIAGNOSIS — Z89511 Acquired absence of right leg below knee: Secondary | ICD-10-CM | POA: Diagnosis not present

## 2022-10-18 DIAGNOSIS — M6281 Muscle weakness (generalized): Secondary | ICD-10-CM

## 2022-10-18 NOTE — Therapy (Signed)
OUTPATIENT PHYSICAL THERAPY LOWER EXTREMITY TREATMENT  Patient Name: Melanie Juarez MRN: [id number] DOB:November 04, 1945, 77 y.o., female Today's Date: 10/18/2022  END OF SESSION:  PT End of Session - 10/18/22 1304     Visit Number 14    Number of Visits 28    Date for PT Re-Evaluation 12/04/22    PT Start Time 1300    PT Stop Time 1345    PT Time Calculation (min) 45 min    Equipment Utilized During Treatment Gait belt    Activity Tolerance Patient tolerated treatment well    Behavior During Therapy WFL for tasks assessed/performed                  Past Medical History:  Diagnosis Date   Diabetes (HCC)    Hypertension    Past Surgical History:  Procedure Laterality Date   AMPUTATION Right 03/22/2022   Procedure: AMPUTATION BELOW KNEE-Guillotine;  Surgeon: Melanie Needy, MD;  Location: [facility] OR[facility];  Service: Vascular;  Laterality: Right;   LOWER EXTREMITY ANGIOGRAPHY Right 03/21/2022   Procedure: Lower Extremity Angiography;  Surgeon: Melanie Needy, MD;  Location: [facility];  Service: Cardiovascular;  Laterality: Right;   STUMP REVISION Right 03/26/2022   Procedure: BKA REVISION;  Surgeon: Melanie Needy, MD;  Location: [facility] OR[facility];  Service: General;  Laterality: Right;   Patient Active Problem List   Diagnosis Date Noted   Hx of BKA, right (HCC) 08/08/2022   Lactic acidosis 03/21/2022   Obesity (BMI 30-39.9) 03/21/2022   PAD (peripheral artery disease) (HCC) 03/21/2022   Osteomyelitis (HCC) 03/19/2022   Diabetic infection of right foot (HCC) 03/19/2022   Essential hypertension 03/19/2022   Leukocytosis 03/19/2022   AKI (acute kidney injury) (HCC) 03/19/2022   Sepsis with acute renal failure without septic shock (HCC) 03/19/2022   Hyperlipidemia 05/17/2014   Type 2 diabetes mellitus (HCC) 05/17/2014   Anemia, unspecified 05/18/2013   Benign essential hypertension 05/18/2013   REFERRING PROVIDER: Annice Needy, MD  REFERRING DIAG: s/p R BKA  THERAPY DIAG:  Hx of  BKA, right (HCC)  Muscle weakness (generalized)  Gait difficulty  Rationale for Evaluation and Treatment: Rehabilitation  ONSET DATE: 03/22/22  SUBJECTIVE:   SUBJECTIVE STATEMENT: Pt. Had a sore on R foot resulting in R BKA on 03/22/22.  Pt. Entered Pt with use of w/c and daughter Melanie Juarez) present.  Pt. Wants to return to living independently.  Pt. Completing HEP from rehab center.  Pt. Has been unable to get into shower.    PERTINENT HISTORY: Melanie Juarez is a 77 y.o. ([birth year]) female who presents with chief complaint of     Chief Complaint  Patient presents with   Venous Insufficiency  .   History of Present Illness: Patient returns today in follow up of her right BKA.  It is healed.  She is in the process of getting her prosthesis now.  She is doing well.  No pain.  No left leg ulceration or rest pain.    Assessment/Plan   PAD (peripheral artery disease) (HCC) Check left ABI on follow up visit in 3 months.    Type 2 diabetes mellitus (HCC) blood glucose control important in reducing the progression of atherosclerotic disease. Also, involved in wound healing. On appropriate medications.    Essential hypertension blood pressure control important in reducing the progression of atherosclerotic disease. On appropriate oral medications.    Hx of BKA, right (HCC) Healed.  In the process of getting her  prosthesis.  A new prescription given for physical therapy for prosthesis and ambulatory training.   [name], MD   08/08/2022 10:42 AM  PAIN:  Are you having pain? No.  Pt. Reports phantom limb symptoms but not pain.    PRECAUTIONS: Fall  RED FLAGS: None   WEIGHT BEARING RESTRICTIONS: No  FALLS:  Has patient fallen in last 6 months? No  LIVING ENVIRONMENT: Lives with: lives with their daughter  Melanie Juarez).   Lives in: House/apartment Stairs: No Has following equipment at home: Dan Humphreys - 2 wheeled, Wheelchair (manual), and Ramped entry  OCCUPATION: Retired  PLOF:  Independent.  Pt. Does not drive.    PATIENT GOALS: Ambulate with least assistive device.  Return home independently/ go back to church.    NEXT MD VISIT: Melanie Juarez 7/31.  MD f/u in September?  OBJECTIVE:   PATIENT SURVEYS:  FOTO initial 29/ goal 11.    COGNITION: Overall cognitive status: Within functional limits for tasks assessed     SENSATION: WFL  EDEMA:  No R residual limb swelling noted.  PT discussed use of shrinker and proper use of ply socks for improved fit of prosthetic leg.    POSTURE: rounded shoulders and forward head  PALPATION: Minimal tenderness along R distal residual limb/ incision.  Good incision healing/ PT discussed benefits of scar massage.    LOWER EXTREMITY ROM:  Active ROM Right eval Left eval  Hip flexion [facility] WFL  Hip extension NT NT  Hip abduction [facility]  Hip adduction    Hip internal rotation    Hip external rotation    Knee flexion [facility] WFL  Knee extension -3 deg. 0 deg.  Ankle dorsiflexion    Ankle plantarflexion    Ankle inversion    Ankle eversion     (Blank rows = not tested)  LOWER EXTREMITY MMT:  MMT Right eval Left eval  Hip flexion 4/5 4/5  Hip extension    Hip abduction 3+/5 4/5  Hip adduction    Hip internal rotation    Hip external rotation    Knee flexion 4+/5  4+/5  Knee extension 4+/5 5/5  Ankle dorsiflexion    Ankle plantarflexion    Ankle inversion    Ankle eversion     (Blank rows = not tested)   B UE AROM WFL.  B UE strength grossly 4/5 MMT except bicep/tricep 4+/5 MMT  FUNCTIONAL TESTS:  5 times sit to stand: TBD Timed up and go (TUG): TBD 6 minute walk test: TBD  GAIT: Distance walked: 45 feet in gym with RW/ min. A for safety and cuing Assistive device utilized: Walker - 2 wheeled Level of assistance: Min A Comments: amb. In gym with use of RW/min A for cuing to increase step length/ heel strike.  Pt. Fearful while walking outside of //-bars.  Pt. Ambs. In //-bars with B UE assist and  increase cadence/ step pattern.  Use of mirror for posture correction/ heel strike (pt. Does not looking at a mirror).     TODAY'S TREATMENT:  DATE: 10/18/2022  Subjective: Pt. entered PT with use of RW and prosthetic leg donned.  Pt reports no pain this afternoon. She says she has been able to do more walking at home and has been practicing walking with single UE support on her ramp or kitchen counter.   Treatment:  Therex:  There.ex.:  Nustep level 5 (seat 10) x 10 minutes to improve cardiorespiratory endurance.  6" step ups on L/R with B UE assist on handrails: 1 x 10 each leg   Gait training:  Static hip extension in standing in // bars: 3x1 minute, followed by fwd/back walking in // bars, then with RW in clinic for functional movement carry-over  Walking in //-bars with L UE only working on consistent 2-point gait pattern/ heel strike/ upright posture, added reaching to grab cone mid-stride with R UE .  3 laps with SBA/CGA for safety and verbal cuing.   Ambulated to/from Pt gym to car (~50 feet x 2), cues for heel strike and increasing step length.  Use of RW and CGA for safety, No LOB during tx.  //-bars: high marching 3 laps each.  B UE assist.  Mirror feedback to improve posture/ consistent step pattern/ heel strike.     Neuro Re'ed: (none today) Narrow Stance in // bars with functional reach: reaching for cones forward on lower surface, 2 x 8 reaches each arm in different directions - No UE support, CGA for safety  Not Today Seated LAQ/marching 20x.  Discussed importance of R knee extension.    Walking in // bars with step over (6 inch hurdles)- L UE assist, 4 laps each of fwd stepping with each foot  Standing L/R hip abduction with light UE assist on handrails 10x2 each.   Amb. around gym with use of RW working on consistent recip. Step pattern.  Cuing  to increase L LE step length.   Walking in //-bars with alt. Cone taps with B UE assist, progress with use of L UE only.  2 laps/ challenged with R LE cone taps with L UE only.  No LOB.    PATIENT EDUCATION:  Education details: Discussed HEP/ standing hip ex. At [employer] Person educated: Patient and Child(ren) Education method: Medical illustrator Education comprehension: verbalized understanding and returned demonstration  HOME EXERCISE PROGRAM: Access Code: [email or web address] URL: [email or web address] Date: 08/30/2022 Prepared by: Maylon Peppers  Exercises - Sit to Stand with Counter Support  - 1 x daily - 5 x weekly - 3 sets - 10 reps - Standing Hip Abduction with Counter Support  - 1 x daily - 5 x weekly - 3 sets - 10 reps - Standing March with Counter Support  - 1 x daily - 5 x weekly - 3 sets - 10 reps - Standing Hip Extension with Counter Support  - 1 x daily - 5 x weekly - 3 sets - 10 reps  ASSESSMENT:  CLINICAL IMPRESSION:   Pt arrives to PT with no reports of pain in her R LE. Session today consisted of further gait and balance training to work on reciprocal gait pattern, heel-strike, increased R hip extension, and functional reach. Exercises also aimed to improve pt's ability and confidence ambulating with unilateral UE support. Pt still requires occasional VC's for reciprocal gait pattern and symmetrical step length. Pt requires CGA throughout all exercises for safety. PT advised Pt to lay prone and perform Hip ext exs to improve gait. Pt demonstrated good understanding. Patient will continue to benefit from skilled PT  services to increase B LE muscle strength to improve independence/safety with prosthetic/gait training.    OBJECTIVE IMPAIRMENTS: Abnormal gait, decreased activity tolerance, decreased balance, decreased endurance, decreased mobility, difficulty walking, decreased ROM, decreased strength, decreased safety awareness, improper body mechanics,  postural dysfunction, and pain.   ACTIVITY LIMITATIONS: carrying, lifting, standing, squatting, stairs, transfers, bed mobility, bathing, toileting, dressing, and locomotion level  PARTICIPATION LIMITATIONS: meal prep, cleaning, laundry, shopping, community activity, and church  PERSONAL FACTORS: Fitness and Past/current experiences are also affecting patient's functional outcome.   REHAB POTENTIAL: Good  CLINICAL DECISION MAKING: Evolving/moderate complexity  EVALUATION COMPLEXITY: Moderate   GOALS: Goals reviewed with patient? Yes  SHORT TERM GOALS: Target date: 09/18/22 Pt. Able to tolerate standing 10 minutes in kitchen with use of prosthetic leg to improve meal preparation.   Baseline:  limited standing tolerance Goal status: Goal met   LONG TERM GOALS: Target date: 12/04/22  Pt. Will increase FOTO to 46 to improve pain-free mobility.   Baseline: initial FOTO 29 Goal status: GOAL MET 10/09/22 (51)  2.  Pt. Will increase B LE muscle strength 1/2 muscle grade to improve pain-free mobility/ walking.   Baseline:  See above Goal status: Partially met (see above for updated MMT)  3.  Pt. Able to ambulate 200 feet with mod. I with least assistive device to improve return to home.   Baseline: amb. Short distances with RW/ min. A from PT.  Limited endurance Goal status: Partially met (pt able to ambulate ~125 feet continuously with RW 10/09/2022)   PLAN:  PT FREQUENCY: 2x/week  PT DURATION: 6 weeks  PLANNED INTERVENTIONS: Therapeutic exercises, Therapeutic activity, Neuromuscular re-education, Balance training, Gait training, Patient/Family education, Self Care, Joint mobilization, Stair training, Prosthetic training, DME instructions, Manual therapy, and Re-evaluation  PLAN FOR NEXT SESSION: Progress gait training with unilateral UE support as tolerated, progress static/dynamic balance exercises, reassess hip extension with gait/hip extension repeats  Dillan Lunden,  SPT 10/18/22, 2:57 PM

## 2022-10-23 ENCOUNTER — Ambulatory Visit: Payer: Medicare Other

## 2022-10-23 ENCOUNTER — Encounter: Payer: Self-pay | Admitting: Physical Therapy

## 2022-10-23 DIAGNOSIS — R269 Unspecified abnormalities of gait and mobility: Secondary | ICD-10-CM

## 2022-10-23 DIAGNOSIS — Z89511 Acquired absence of right leg below knee: Secondary | ICD-10-CM | POA: Diagnosis not present

## 2022-10-23 DIAGNOSIS — M6281 Muscle weakness (generalized): Secondary | ICD-10-CM

## 2022-10-23 NOTE — Therapy (Signed)
OUTPATIENT PHYSICAL THERAPY LOWER EXTREMITY TREATMENT  Patient Name: Melanie Juarez MRN: [id number] DOB:1945/09/05, 77 y.o., female Today's Date: 10/23/2022  END OF SESSION:  PT End of Session - 10/23/22 1313     Visit Number 15    Number of Visits 28    Date for PT Re-Evaluation 12/04/22    PT Start Time 1309    PT Stop Time 1345    PT Time Calculation (min) 36 min    Equipment Utilized During Treatment Gait belt    Activity Tolerance Patient tolerated treatment well    Behavior During Therapy WFL for tasks assessed/performed                   Past Medical History:  Diagnosis Date   Diabetes (HCC)    Hypertension    Past Surgical History:  Procedure Laterality Date   AMPUTATION Right 03/22/2022   Procedure: AMPUTATION BELOW KNEE-Guillotine;  Surgeon: Annice Needy, MD;  Location: [facility];  Service: Vascular;  Laterality: Right;   LOWER EXTREMITY ANGIOGRAPHY Right 03/21/2022   Procedure: Lower Extremity Angiography;  Surgeon: Annice Needy, MD;  Location: [facility];  Service: Cardiovascular;  Laterality: Right;   STUMP REVISION Right 03/26/2022   Procedure: BKA REVISION;  Surgeon: Annice Needy, MD;  Location: [facility] OR[facility];  Service: General;  Laterality: Right;   Patient Active Problem List   Diagnosis Date Noted   Hx of BKA, right (HCC) 08/08/2022   Lactic acidosis 03/21/2022   Obesity (BMI 30-39.9) 03/21/2022   PAD (peripheral artery disease) (HCC) 03/21/2022   Osteomyelitis (HCC) 03/19/2022   Diabetic infection of right foot (HCC) 03/19/2022   Essential hypertension 03/19/2022   Leukocytosis 03/19/2022   AKI (acute kidney injury) (HCC) 03/19/2022   Sepsis with acute renal failure without septic shock (HCC) 03/19/2022   Hyperlipidemia 05/17/2014   Type 2 diabetes mellitus (HCC) 05/17/2014   Anemia, unspecified 05/18/2013   Benign essential hypertension 05/18/2013   REFERRING PROVIDER: Annice Needy, MD  REFERRING DIAG: s/p R BKA  THERAPY DIAG:  Hx of  BKA, right (HCC)  Muscle weakness (generalized)  Gait difficulty  Rationale for Evaluation and Treatment: Rehabilitation  ONSET DATE: 03/22/22  SUBJECTIVE:   SUBJECTIVE STATEMENT: Pt. Had a sore on R foot resulting in R BKA on 03/22/22.  [name]  Pt. Wants to return to living independently.  Pt. Completing HEP from rehab center.  Pt. Has been unable to get into shower.    PERTINENT HISTORY: TIHESHA FRANKLYN is a 77 y.o. (1945/07/10) female who presents with chief complaint of     Chief Complaint  Patient presents with   Venous Insufficiency  .   History of Present Illness: Patient returns today in follow up of her right BKA.  It is healed.  She is in the process of getting her prosthesis now.  She is doing well.  No pain.  No left leg ulceration or rest pain.    Assessment/Plan   PAD (peripheral artery disease) (HCC) Check left ABI on follow up visit in 3 months.    Type 2 diabetes mellitus (HCC) blood glucose control important in reducing the progression of atherosclerotic disease. Also, involved in wound healing. On appropriate medications.    Essential hypertension blood pressure control important in reducing the progression of atherosclerotic disease. On appropriate oral medications.    Hx of BKA, right (HCC) Healed.  In the process of getting  her prosthesis.  A new prescription given for physical therapy for prosthesis and ambulatory training.   [name], MD   08/08/2022 10:42 AM  PAIN:  Are you having pain? No.  Pt. Reports phantom limb symptoms but not pain.    PRECAUTIONS: Fall  RED FLAGS: None   WEIGHT BEARING RESTRICTIONS: No  FALLS:  Has patient fallen in last 6 months? No  LIVING ENVIRONMENT: Lives with: lives with their daughter  [name].   Lives in: House/apartment Stairs: No Has following equipment at home: Dan Humphreys - 2 wheeled, Wheelchair (manual), and Ramped entry  OCCUPATION: Retired  PLOF:  Independent.  Pt. Does not drive.    PATIENT GOALS: Ambulate with least assistive device.  Return home independently/ go back to church.    NEXT MD VISIT: [name] 7/31.  MD f/u in September?  OBJECTIVE:   PATIENT SURVEYS:  FOTO initial 29/ goal 68.    COGNITION: Overall cognitive status: Within functional limits for tasks assessed     SENSATION: WFL  EDEMA:  No R residual limb swelling noted.  PT discussed use of shrinker and proper use of ply socks for improved fit of prosthetic leg.    POSTURE: rounded shoulders and forward head  PALPATION: Minimal tenderness along R distal residual limb/ incision.  Good incision healing/ PT discussed benefits of scar massage.    LOWER EXTREMITY ROM:  Active ROM Right eval Left eval  Hip flexion [facility] WFL  Hip extension NT NT  Hip abduction [facility]  Hip adduction    Hip internal rotation    Hip external rotation    Knee flexion [facility] WFL  Knee extension -3 deg. 0 deg.  Ankle dorsiflexion    Ankle plantarflexion    Ankle inversion    Ankle eversion     (Blank rows = not tested)  LOWER EXTREMITY MMT:  MMT Right eval Left eval  Hip flexion 4/5 4/5  Hip extension    Hip abduction 3+/5 4/5  Hip adduction    Hip internal rotation    Hip external rotation    Knee flexion 4+/5  4+/5  Knee extension 4+/5 5/5  Ankle dorsiflexion    Ankle plantarflexion    Ankle inversion    Ankle eversion     (Blank rows = not tested)   B UE AROM WFL.  B UE strength grossly 4/5 MMT except bicep/tricep 4+/5 MMT  FUNCTIONAL TESTS:  5 times sit to stand: TBD Timed up and go (TUG): TBD 6 minute walk test: TBD  GAIT: Distance walked: 45 feet in gym with RW/ min. A for safety and cuing Assistive device utilized: Walker - 2 wheeled Level of assistance: Min A Comments: amb. In gym with use of RW/min A for cuing to increase step length/ heel strike.  Pt. Fearful while walking outside of //-bars.  Pt. Ambs. In //-bars with B UE assist and  increase cadence/ step pattern.  Use of mirror for posture correction/ heel strike (pt. Does not looking at a mirror).     TODAY'S TREATMENT:  DATE: 10/23/2022  Subjective: Pt. entered PT with use of RW and prosthetic leg donned.  Pt reports no pain this afternoon. She says she had an injection in her R eye yesterday to help with her vision; she says she has not noticed any improvement yet.  Treatment:  Therex:  There.ex.:  Nustep level 5 (seat 10) x 8 minutes to improve cardiorespiratory endurance.   6" Lateral step ups on L/R with B UE assist on handrails: 4 bouts of up/down 2 stairs each leg   Sit to stands from chair: x5 reps from chair with BUE support - attempted with unilateral UE support on stairs but pt unable to complete independently  Lateral Step Downs: 2x8 each leg to emphasize eccentric control with stairs, BUE support on rails    Gait training: (none today) Static hip extension in standing in // bars: 3x1 minute, followed by fwd/back walking in // bars, then with RW in clinic for functional movement carry-over  Walking in //-bars with L UE only working on consistent 2-point gait pattern/ heel strike/ upright posture, added reaching to grab cone mid-stride with R UE .  3 laps with SBA/CGA for safety and verbal cuing.   Ambulated to/from Pt gym to car (~50 feet x 2), cues for heel strike and increasing step length.  Use of RW and CGA for safety, No LOB during tx.  //-bars: high marching 3 laps each.  B UE assist.  Mirror feedback to improve posture/ consistent step pattern/ heel strike.     Neuro Re'ed: (none today) Narrow Stance in // bars with functional reach: reaching for cones forward on lower surface, 2 x 8 reaches each arm in different directions - No UE support, CGA for safety  Not Today Seated LAQ/marching 20x.  Discussed importance of R  knee extension.    Walking in // bars with step over (6 inch hurdles)- L UE assist, 4 laps each of fwd stepping with each foot  Standing L/R hip abduction with light UE assist on handrails 10x2 each.   Amb. around gym with use of RW working on consistent recip. Step pattern.  Cuing to increase L LE step length.   Walking in //-bars with alt. Cone taps with B UE assist, progress with use of L UE only.  2 laps/ challenged with R LE cone taps with L UE only.  No LOB.    PATIENT EDUCATION:  Education details: Discussed HEP/ standing hip ex. At [employer] Person educated: Patient and Child(ren) Education method: Medical illustrator Education comprehension: verbalized understanding and returned demonstration  HOME EXERCISE PROGRAM: Access Code: [email or web address] URL: [email or web address] Date: 08/30/2022 Prepared by: Maylon Peppers  Exercises - Sit to Stand with Counter Support  - 1 x daily - 5 x weekly - 3 sets - 10 reps - Standing Hip Abduction with Counter Support  - 1 x daily - 5 x weekly - 3 sets - 10 reps - Standing March with Counter Support  - 1 x daily - 5 x weekly - 3 sets - 10 reps - Standing Hip Extension with Counter Support  - 1 x daily - 5 x weekly - 3 sets - 10 reps  ASSESSMENT:  CLINICAL IMPRESSION:   Pt arrives to PT with no reports of pain in her R LE. Pt still reports her prosthetic feels "off" but is unable to elaborate further in terms of fit; pt may benefit from another visit to prosthetist to evaluate prosthetic fit. Session today consisted of exercises aimed  to improve patient's function and stability with stair navigation. Pt struggled with eccentric control when descending stairs laterally, particularly when using her L LE to control the movement. Pt continues to require occasional seated therapeutic rest breaks throughout the session. Pt demonstrated good understanding. Patient will continue to benefit from skilled PT services to increase B LE  muscle strength to improve independence/safety with prosthetic/gait training.    OBJECTIVE IMPAIRMENTS: Abnormal gait, decreased activity tolerance, decreased balance, decreased endurance, decreased mobility, difficulty walking, decreased ROM, decreased strength, decreased safety awareness, improper body mechanics, postural dysfunction, and pain.   ACTIVITY LIMITATIONS: carrying, lifting, standing, squatting, stairs, transfers, bed mobility, bathing, toileting, dressing, and locomotion level  PARTICIPATION LIMITATIONS: meal prep, cleaning, laundry, shopping, community activity, and church  PERSONAL FACTORS: Fitness and Past/current experiences are also affecting patient's functional outcome.   REHAB POTENTIAL: Good  CLINICAL DECISION MAKING: Evolving/moderate complexity  EVALUATION COMPLEXITY: Moderate   GOALS: Goals reviewed with patient? Yes  SHORT TERM GOALS: Target date: 09/18/22 Pt. Able to tolerate standing 10 minutes in kitchen with use of prosthetic leg to improve meal preparation.   Baseline:  limited standing tolerance Goal status: Goal met   LONG TERM GOALS: Target date: 12/04/22  Pt. Will increase FOTO to 46 to improve pain-free mobility.   Baseline: initial FOTO 29 Goal status: GOAL MET 10/09/22 (51)  2.  Pt. Will increase B LE muscle strength 1/2 muscle grade to improve pain-free mobility/ walking.   Baseline:  See above Goal status: Partially met (see above for updated MMT)  3.  Pt. Able to ambulate 200 feet with mod. I with least assistive device to improve return to home.   Baseline: amb. Short distances with RW/ min. A from PT.  Limited endurance Goal status: Partially met (pt able to ambulate ~125 feet continuously with RW 10/09/2022)   PLAN:  PT FREQUENCY: 2x/week  PT DURATION: 6 weeks  PLANNED INTERVENTIONS: Therapeutic exercises, Therapeutic activity, Neuromuscular re-education, Balance training, Gait training, Patient/Family education, Self Care,  Joint mobilization, Stair training, Prosthetic training, DME instructions, Manual therapy, and Re-evaluation  PLAN FOR NEXT SESSION: Progress gait training with unilateral UE support as tolerated, progress static/dynamic balance exercises, reassess hip extension with gait/hip extension repeats, continue with eccentric quad strengthening  Kebin Maye, SPT 10/23/22, 2:41 PM Janet Berlin PT DPT 4:12 PM,10/23/22

## 2022-10-25 ENCOUNTER — Encounter: Payer: Self-pay | Admitting: Physical Therapy

## 2022-10-25 ENCOUNTER — Ambulatory Visit: Payer: Medicare Other

## 2022-10-25 DIAGNOSIS — Z89511 Acquired absence of right leg below knee: Secondary | ICD-10-CM

## 2022-10-25 DIAGNOSIS — R269 Unspecified abnormalities of gait and mobility: Secondary | ICD-10-CM

## 2022-10-25 DIAGNOSIS — M6281 Muscle weakness (generalized): Secondary | ICD-10-CM

## 2022-10-25 NOTE — Therapy (Signed)
OUTPATIENT PHYSICAL THERAPY LOWER EXTREMITY TREATMENT  Patient Name: Melanie Juarez MRN: [id number] DOB:1945/07/25, 77 y.o., female Today's Date: 10/25/2022  END OF SESSION:  PT End of Session - 10/25/22 1301     Visit Number 16    Number of Visits 28    Date for PT Re-Evaluation 12/04/22    PT Start Time 1256    PT Stop Time 1345    PT Time Calculation (min) 49 min    Equipment Utilized During Treatment Gait belt    Activity Tolerance Patient tolerated treatment well    Behavior During Therapy WFL for tasks assessed/performed                    Past Medical History:  Diagnosis Date   Diabetes (HCC)    Hypertension    Past Surgical History:  Procedure Laterality Date   AMPUTATION Right 03/22/2022   Procedure: AMPUTATION BELOW KNEE-Guillotine;  Surgeon: Annice Needy, MD;  Location: [facility] OR[facility];  Service: Vascular;  Laterality: Right;   LOWER EXTREMITY ANGIOGRAPHY Right 03/21/2022   Procedure: Lower Extremity Angiography;  Surgeon: Annice Needy, MD;  Location: [facility];  Service: Cardiovascular;  Laterality: Right;   STUMP REVISION Right 03/26/2022   Procedure: BKA REVISION;  Surgeon: Annice Needy, MD;  Location: [facility] OR[facility];  Service: General;  Laterality: Right;   Patient Active Problem List   Diagnosis Date Noted   Hx of BKA, right (HCC) 08/08/2022   Lactic acidosis 03/21/2022   Obesity (BMI 30-39.9) 03/21/2022   PAD (peripheral artery disease) (HCC) 03/21/2022   Osteomyelitis (HCC) 03/19/2022   Diabetic infection of right foot (HCC) 03/19/2022   Essential hypertension 03/19/2022   Leukocytosis 03/19/2022   AKI (acute kidney injury) (HCC) 03/19/2022   Sepsis with acute renal failure without septic shock (HCC) 03/19/2022   Hyperlipidemia 05/17/2014   Type 2 diabetes mellitus (HCC) 05/17/2014   Anemia, unspecified 05/18/2013   Benign essential hypertension 05/18/2013   REFERRING PROVIDER: Annice Needy, MD  REFERRING DIAG: s/p R BKA  THERAPY DIAG:  Hx  of BKA, right (HCC)  Muscle weakness (generalized)  Gait difficulty  Rationale for Evaluation and Treatment: Rehabilitation  ONSET DATE: 03/22/22  SUBJECTIVE:   SUBJECTIVE STATEMENT: Pt. Had a sore on R foot resulting in R BKA on 03/22/22.  [name]  Pt. Wants to return to living independently.  Pt. Completing HEP from rehab center.  Pt. Has been unable to get into shower.    PERTINENT HISTORY: Melanie Juarez is a 77 y.o. (December 02, 1945) female who presents with chief complaint of     Chief Complaint  Patient presents with   Venous Insufficiency  .   History of Present Illness: Patient returns today in follow up of her right BKA.  It is healed.  She is in the process of getting her prosthesis now.  She is doing well.  No pain.  No left leg ulceration or rest pain.    Assessment/Plan   PAD (peripheral artery disease) (HCC) Check left ABI on follow up visit in 3 months.    Type 2 diabetes mellitus (HCC) blood glucose control important in reducing the progression of atherosclerotic disease. Also, involved in wound healing. On appropriate medications.    Essential hypertension blood pressure control important in reducing the progression of atherosclerotic disease. On appropriate oral medications.    Hx of BKA, right (HCC) Healed.  In the process of  getting her prosthesis.  A new prescription given for physical therapy for prosthesis and ambulatory training.   [name], MD   08/08/2022 10:42 AM  PAIN:  Are you having pain? No.  Pt. Reports phantom limb symptoms but not pain.    PRECAUTIONS: Fall  RED FLAGS: None   WEIGHT BEARING RESTRICTIONS: No  FALLS:  Has patient fallen in last 6 months? No  LIVING ENVIRONMENT: Lives with: lives with their daughter  [name].   Lives in: House/apartment Stairs: No Has following equipment at home: Dan Humphreys - 2 wheeled, Wheelchair (manual), and Ramped entry  OCCUPATION:  Retired  PLOF: Independent.  Pt. Does not drive.    PATIENT GOALS: Ambulate with least assistive device.  Return home independently/ go back to church.    NEXT MD VISIT: [name] 7/31.  MD f/u in September?  OBJECTIVE:   PATIENT SURVEYS:  FOTO initial 29/ goal 68.    COGNITION: Overall cognitive status: Within functional limits for tasks assessed     SENSATION: WFL  EDEMA:  No R residual limb swelling noted.  PT discussed use of shrinker and proper use of ply socks for improved fit of prosthetic leg.    POSTURE: rounded shoulders and forward head  PALPATION: Minimal tenderness along R distal residual limb/ incision.  Good incision healing/ PT discussed benefits of scar massage.    LOWER EXTREMITY ROM:  Active ROM Right eval Left eval  Hip flexion [facility] WFL  Hip extension NT NT  Hip abduction [facility]  Hip adduction    Hip internal rotation    Hip external rotation    Knee flexion [facility] WFL  Knee extension -3 deg. 0 deg.  Ankle dorsiflexion    Ankle plantarflexion    Ankle inversion    Ankle eversion     (Blank rows = not tested)  LOWER EXTREMITY MMT:  MMT Right eval Left eval  Hip flexion 4/5 4/5  Hip extension    Hip abduction 3+/5 4/5  Hip adduction    Hip internal rotation    Hip external rotation    Knee flexion 4+/5  4+/5  Knee extension 4+/5 5/5  Ankle dorsiflexion    Ankle plantarflexion    Ankle inversion    Ankle eversion     (Blank rows = not tested)   B UE AROM WFL.  B UE strength grossly 4/5 MMT except bicep/tricep 4+/5 MMT  FUNCTIONAL TESTS:  5 times sit to stand: TBD Timed up and go (TUG): TBD 6 minute walk test: TBD  GAIT: Distance walked: 45 feet in gym with RW/ min. A for safety and cuing Assistive device utilized: Walker - 2 wheeled Level of assistance: Min A Comments: amb. In gym with use of RW/min A for cuing to increase step length/ heel strike.  Pt. Fearful while walking outside of //-bars.  Pt. Ambs. In //-bars with B UE  assist and increase cadence/ step pattern.  Use of mirror for posture correction/ heel strike (pt. Does not looking at a mirror).     TODAY'S TREATMENT:  DATE: 10/25/2022  Subjective: Pt. entered PT with use of RW and prosthetic leg donned.  Pt reports no pain this afternoon. She says she tried using an extra sock and feels like her leg is fitting her better now. Pt reports no improvement in her R eyesight yet since her injection.  Treatment:  Therex:  There.ex.:  Nustep level 5 (seat 10) x 8 minutes to improve cardiorespiratory endurance.   Static standing rapid hip abduction repeats: 3x1 minute holds (2x R, 1x L)  Static standing rapid hip flexion repeats: 3x1 minute holds (2x R, 1x L)    Gait training:   Walking in //-bars over small hurdles with L UE only working on consistent 2-point gait pattern/ heel strike/ upright posture, added reaching to grab cone mid-stride with R UE .  4 laps with SBA/CGA for safety and verbal cuing.   Ambulated to/from Pt gym to car (~50 feet x 2), cues for heel strike and increasing step length.  Use of RW and CGA for safety, No LOB during tx.  Walking in // bars fwd/back over wooden planks x 4: 4 laps alternating leading with each foot - VC's for increasing step length and hip extension on R side   Not Today Seated LAQ/marching 20x.  Discussed importance of R knee extension.    Static hip extension in standing in // bars: 3x1 minute, followed by fwd/back walking in // bars, then with RW in clinic for functional movement carry-over  Narrow Stance in // bars with functional reach: reaching for cones forward on lower surface, 2 x 8 reaches each arm in different directions - No UE support, CGA for safety  6" Lateral step ups on L/R with B UE assist on handrails: 4 bouts of up/down 2 stairs each leg   Sit to stands from chair: x5  reps from chair with BUE support - attempted with unilateral UE support on stairs but pt unable to complete independently  Lateral Step Downs: 2x8 each leg to emphasize eccentric control with stairs, BUE support on rails  Walking in // bars with step over (6 inch hurdles)- L UE assist, 4 laps each of fwd stepping with each foot  Standing L/R hip abduction with light UE assist on handrails 10x2 each.   Amb. around gym with use of RW working on consistent recip. Step pattern.  Cuing to increase L LE step length.   Walking in //-bars with alt. Cone taps with B UE assist, progress with use of L UE only.  2 laps/ challenged with R LE cone taps with L UE only.  No LOB.    PATIENT EDUCATION:  Education details: Discussed HEP/ standing hip ex. At [employer] Person educated: Patient and Child(ren) Education method: Medical illustrator Education comprehension: verbalized understanding and returned demonstration  HOME EXERCISE PROGRAM: Access Code: [email or web address] URL: [email or web address] Date: 08/30/2022 Prepared by: Maylon Peppers  Exercises - Sit to Stand with Counter Support  - 1 x daily - 5 x weekly - 3 sets - 10 reps - Standing Hip Abduction with Counter Support  - 1 x daily - 5 x weekly - 3 sets - 10 reps - Standing March with Counter Support  - 1 x daily - 5 x weekly - 3 sets - 10 reps - Standing Hip Extension with Counter Support  - 1 x daily - 5 x weekly - 3 sets - 10 reps  ASSESSMENT:  CLINICAL IMPRESSION:   Pt arrives to PT with no reports of pain in  her R LE. She reports prosthetic fit feels better today after adding extra ply sock. Pt continues to demonstrate improved reciprocal gait pattern  with improved hip extension on the R LE. Gait training exercises today emphasized using unilateral UE support to simulate SPC use on the L side. Standing rapid hip abduction and flexion movements performed at // bars at end of session to improve pt's fast-twitch fiber  activation in the R LE. Patient will continue to benefit from skilled PT services to increase B LE muscle strength to improve independence/safety with prosthetic/gait training.    OBJECTIVE IMPAIRMENTS: Abnormal gait, decreased activity tolerance, decreased balance, decreased endurance, decreased mobility, difficulty walking, decreased ROM, decreased strength, decreased safety awareness, improper body mechanics, postural dysfunction, and pain.   ACTIVITY LIMITATIONS: carrying, lifting, standing, squatting, stairs, transfers, bed mobility, bathing, toileting, dressing, and locomotion level  PARTICIPATION LIMITATIONS: meal prep, cleaning, laundry, shopping, community activity, and church  PERSONAL FACTORS: Fitness and Past/current experiences are also affecting patient's functional outcome.   REHAB POTENTIAL: Good  CLINICAL DECISION MAKING: Evolving/moderate complexity  EVALUATION COMPLEXITY: Moderate   GOALS: Goals reviewed with patient? Yes  SHORT TERM GOALS: Target date: 09/18/22 Pt. Able to tolerate standing 10 minutes in kitchen with use of prosthetic leg to improve meal preparation.   Baseline:  limited standing tolerance Goal status: Goal met   LONG TERM GOALS: Target date: 12/04/22  Pt. Will increase FOTO to 46 to improve pain-free mobility.   Baseline: initial FOTO 29 Goal status: GOAL MET 10/09/22 (51)  2.  Pt. Will increase B LE muscle strength 1/2 muscle grade to improve pain-free mobility/ walking.   Baseline:  See above Goal status: Partially met (see above for updated MMT)  3.  Pt. Able to ambulate 200 feet with mod. I with least assistive device to improve return to home.   Baseline: amb. Short distances with RW/ min. A from PT.  Limited endurance Goal status: Partially met (pt able to ambulate ~125 feet continuously with RW 10/09/2022)   PLAN:  PT FREQUENCY: 2x/week  PT DURATION: 6 weeks  PLANNED INTERVENTIONS: Therapeutic exercises, Therapeutic activity,  Neuromuscular re-education, Balance training, Gait training, Patient/Family education, Self Care, Joint mobilization, Stair training, Prosthetic training, DME instructions, Manual therapy, and Re-evaluation  PLAN FOR NEXT SESSION: Progress gait training with unilateral UE support as tolerated, progress static/dynamic balance exercises, continue with eccentric quad strengthening, fast-twitch muscle exercises (static repeats with metronome)  Cataleyah Colborn, SPT 10/25/22, 7:01 PM

## 2022-10-30 ENCOUNTER — Ambulatory Visit: Payer: Medicare Other | Admitting: Physical Therapy

## 2022-11-01 ENCOUNTER — Encounter: Payer: Self-pay | Admitting: Physical Therapy

## 2022-11-01 ENCOUNTER — Ambulatory Visit: Payer: Medicare Other

## 2022-11-01 DIAGNOSIS — R269 Unspecified abnormalities of gait and mobility: Secondary | ICD-10-CM

## 2022-11-01 DIAGNOSIS — Z89511 Acquired absence of right leg below knee: Secondary | ICD-10-CM

## 2022-11-01 DIAGNOSIS — M6281 Muscle weakness (generalized): Secondary | ICD-10-CM

## 2022-11-01 NOTE — Therapy (Signed)
OUTPATIENT PHYSICAL THERAPY LOWER EXTREMITY TREATMENT  Patient Name: Melanie Juarez MRN: [id number] DOB:26-Oct-1945, 77 y.o., female Today's Date: 11/01/2022  END OF SESSION:  PT End of Session - 11/01/22 1302     Visit Number 17    Number of Visits 28    Date for PT Re-Evaluation 12/04/22    PT Start Time 1255    PT Stop Time 1345    PT Time Calculation (min) 50 min    Equipment Utilized During Treatment Gait belt    Activity Tolerance Patient tolerated treatment well    Behavior During Therapy WFL for tasks assessed/performed                     Past Medical History:  Diagnosis Date   Diabetes (HCC)    Hypertension    Past Surgical History:  Procedure Laterality Date   AMPUTATION Right 03/22/2022   Procedure: AMPUTATION BELOW KNEE-Guillotine;  Surgeon: Annice Needy, MD;  Location: [facility];  Service: Vascular;  Laterality: Right;   LOWER EXTREMITY ANGIOGRAPHY Right 03/21/2022   Procedure: Lower Extremity Angiography;  Surgeon: Annice Needy, MD;  Location: [facility];  Service: Cardiovascular;  Laterality: Right;   STUMP REVISION Right 03/26/2022   Procedure: BKA REVISION;  Surgeon: Annice Needy, MD;  Location: [facility];  Service: General;  Laterality: Right;   Patient Active Problem List   Diagnosis Date Noted   Hx of BKA, right (HCC) 08/08/2022   Lactic acidosis 03/21/2022   Obesity (BMI 30-39.9) 03/21/2022   PAD (peripheral artery disease) (HCC) 03/21/2022   Osteomyelitis (HCC) 03/19/2022   Diabetic infection of right foot (HCC) 03/19/2022   Essential hypertension 03/19/2022   Leukocytosis 03/19/2022   AKI (acute kidney injury) (HCC) 03/19/2022   Sepsis with acute renal failure without septic shock (HCC) 03/19/2022   Hyperlipidemia 05/17/2014   Type 2 diabetes mellitus (HCC) 05/17/2014   Anemia, unspecified 05/18/2013   Benign essential hypertension 05/18/2013   REFERRING PROVIDER: Annice Needy, MD  REFERRING DIAG: s/p R BKA  THERAPY DIAG:   Hx of BKA, right (HCC)  Muscle weakness (generalized)  Gait difficulty  Rationale for Evaluation and Treatment: Rehabilitation  ONSET DATE: 03/22/22  SUBJECTIVE:   SUBJECTIVE STATEMENT: Pt. Had a sore on R foot resulting in R BKA on 03/22/22.  [name]  Pt. Wants to return to living independently.  Pt. Completing HEP from rehab center.  Pt. Has been unable to get into shower.    PERTINENT HISTORY: SOPHIYA PEAVLER is a 77 y.o. (03-06-1945) female who presents with chief complaint of     Chief Complaint  Patient presents with   Venous Insufficiency  .   History of Present Illness: Patient returns today in follow up of her right BKA.  It is healed.  She is in the process of getting her prosthesis now.  She is doing well.  No pain.  No left leg ulceration or rest pain.    Assessment/Plan   PAD (peripheral artery disease) (HCC) Check left ABI on follow up visit in 3 months.    Type 2 diabetes mellitus (HCC) blood glucose control important in reducing the progression of atherosclerotic disease. Also, involved in wound healing. On appropriate medications.    Essential hypertension blood pressure control important in reducing the progression of atherosclerotic disease. On appropriate oral medications.    Hx of BKA, right (HCC) Healed.  In the process  of getting her prosthesis.  A new prescription given for physical therapy for prosthesis and ambulatory training.   [name], MD   08/08/2022 10:42 AM  PAIN:  Are you having pain? No.  Pt. Reports phantom limb symptoms but not pain.    PRECAUTIONS: Fall  RED FLAGS: None   WEIGHT BEARING RESTRICTIONS: No  FALLS:  Has patient fallen in last 6 months? No  LIVING ENVIRONMENT: Lives with: lives with their daughter  [name].   Lives in: House/apartment Stairs: No Has following equipment at home: Dan Humphreys - 2 wheeled, Wheelchair (manual), and Ramped entry  OCCUPATION:  Retired  PLOF: Independent.  Pt. Does not drive.    PATIENT GOALS: Ambulate with least assistive device.  Return home independently/ go back to church.    NEXT MD VISIT: [name] 7/31.  MD f/u in September?  OBJECTIVE:   PATIENT SURVEYS:  FOTO initial 29/ goal 22.    COGNITION: Overall cognitive status: Within functional limits for tasks assessed     SENSATION: WFL  EDEMA:  No R residual limb swelling noted.  PT discussed use of shrinker and proper use of ply socks for improved fit of prosthetic leg.    POSTURE: rounded shoulders and forward head  PALPATION: Minimal tenderness along R distal residual limb/ incision.  Good incision healing/ PT discussed benefits of scar massage.    LOWER EXTREMITY ROM:  Active ROM Right eval Left eval  Hip flexion [facility] WFL  Hip extension NT NT  Hip abduction [facility]  Hip adduction    Hip internal rotation    Hip external rotation    Knee flexion [facility] WFL  Knee extension -3 deg. 0 deg.  Ankle dorsiflexion    Ankle plantarflexion    Ankle inversion    Ankle eversion     (Blank rows = not tested)  LOWER EXTREMITY MMT:  MMT Right eval Left eval  Hip flexion 4/5 4/5  Hip extension    Hip abduction 3+/5 4/5  Hip adduction    Hip internal rotation    Hip external rotation    Knee flexion 4+/5  4+/5  Knee extension 4+/5 5/5  Ankle dorsiflexion    Ankle plantarflexion    Ankle inversion    Ankle eversion     (Blank rows = not tested)   B UE AROM WFL.  B UE strength grossly 4/5 MMT except bicep/tricep 4+/5 MMT  FUNCTIONAL TESTS:  5 times sit to stand: TBD Timed up and go (TUG): TBD 6 minute walk test: TBD  GAIT: Distance walked: 45 feet in gym with RW/ min. A for safety and cuing Assistive device utilized: Walker - 2 wheeled Level of assistance: Min A Comments: amb. In gym with use of RW/min A for cuing to increase step length/ heel strike.  Pt. Fearful while walking outside of //-bars.  Pt. Ambs. In //-bars with B UE  assist and increase cadence/ step pattern.  Use of mirror for posture correction/ heel strike (pt. Does not looking at a mirror).     TODAY'S TREATMENT:  DATE: 11/01/2022  Subjective: Pt. entered PT with use of RW and prosthetic leg donned.  Pt reports no pain this afternoon; she says she has felt a little under the weather the past few days and hasn't had a lot of energy.   Treatment:  BP in sitting at start of session: 151/50, HR 75 bpm   There.ex.:  Nustep level 5 (seat 10) x 8 minutes to improve cardiorespiratory endurance.   Static standing rapid R hip repeats: 1x1 minute holds each for abduction, extension, flexion; 2# AW added today  Lateral 6-inch Step Downs: 2x1 minutes each leg to emphasize eccentric control with stairs, BUE support on rails, VC's to use LE's more than UE's  Gait training:   Walking in // bars with only quad-cane in RUE: 4 laps - VC's for correct sequencing cane with prosthetic limb, increasing step length on LLE.     Not Today Seated LAQ/marching 20x.  Discussed importance of R knee extension.    Static hip extension in standing in // bars: 3x1 minute, followed by fwd/back walking in // bars, then with RW in clinic for functional movement carry-over  Narrow Stance in // bars with functional reach: reaching for cones forward on lower surface, 2 x 8 reaches each arm in different directions - No UE support, CGA for safety  6" Lateral step ups on L/R with B UE assist on handrails: 4 bouts of up/down 2 stairs each leg   Sit to stands from chair: x5 reps from chair with BUE support - attempted with unilateral UE support on stairs but pt unable to complete independently    Walking in // bars with step over (6 inch hurdles)- L UE assist, 4 laps each of fwd stepping with each foot  Standing L/R hip abduction with light UE assist on  handrails 10x2 each.   Amb. around gym with use of RW working on consistent recip. Step pattern.  Cuing to increase L LE step length.   Walking in //-bars with alt. Cone taps with B UE assist, progress with use of L UE only.  2 laps/ challenged with R LE cone taps with L UE only.  No LOB.    PATIENT EDUCATION:  Education details: Discussed HEP/ standing hip ex. At [employer] Person educated: Patient and Child(ren) Education method: Medical illustrator Education comprehension: verbalized understanding and returned demonstration  HOME EXERCISE PROGRAM: Access Code: [email or web address] URL: [email or web address] Date: 08/30/2022 Prepared by: Maylon Peppers  Exercises - Sit to Stand with Counter Support  - 1 x daily - 5 x weekly - 3 sets - 10 reps - Standing Hip Abduction with Counter Support  - 1 x daily - 5 x weekly - 3 sets - 10 reps - Standing March with Counter Support  - 1 x daily - 5 x weekly - 3 sets - 10 reps - Standing Hip Extension with Counter Support  - 1 x daily - 5 x weekly - 3 sets - 10 reps  ASSESSMENT:  CLINICAL IMPRESSION:   Pt arrives to PT with no reports of pain in her R LE. Session today consisted initially of rapid repeat exercises in order to improve activation of fast twitch muscle fibers in the RLE; pt able to perform them today with a 2# AW with no increase in pain, just reports of muscular fatigue. Step-down exercise performed again today to continue improving pt's eccentric quad strength needed for improved stair navigation. Session ended with pt trialing ambulation in the // bars with  just a quad cane in her LUE. Pt demonstrated step-to pattern with decreased hip extension on the R LE as well as decreased step length on the LLE when using the quad cane. Pt would occasionally require the // bars for balance and stability but did not have any major losses of balance. Pt trialed walking with quad cane outside // bars on the way out of the clinic for  ~10 feet and demonstrated a very cautious and slow step-to pattern even compared to practicing in the // bars. Pt relied heavily on the Q-cane with decreased step length, uneven step length, unsteady on feet, deceased loading response on RLE requiring min to mod assist to maintain balance.   Patient will continue to benefit from skilled PT services to increase B LE muscle strength to improve independence/safety with prosthetic/gait training.    OBJECTIVE IMPAIRMENTS: Abnormal gait, decreased activity tolerance, decreased balance, decreased endurance, decreased mobility, difficulty walking, decreased ROM, decreased strength, decreased safety awareness, improper body mechanics, postural dysfunction, and pain.   ACTIVITY LIMITATIONS: carrying, lifting, standing, squatting, stairs, transfers, bed mobility, bathing, toileting, dressing, and locomotion level  PARTICIPATION LIMITATIONS: meal prep, cleaning, laundry, shopping, community activity, and church  PERSONAL FACTORS: Fitness and Past/current experiences are also affecting patient's functional outcome.   REHAB POTENTIAL: Good  CLINICAL DECISION MAKING: Evolving/moderate complexity  EVALUATION COMPLEXITY: Moderate   GOALS: Goals reviewed with patient? Yes  SHORT TERM GOALS: Target date: 09/18/22 Pt. Able to tolerate standing 10 minutes in kitchen with use of prosthetic leg to improve meal preparation.   Baseline:  limited standing tolerance Goal status: Goal met   LONG TERM GOALS: Target date: 12/04/22  Pt. Will increase FOTO to 46 to improve pain-free mobility.   Baseline: initial FOTO 29 Goal status: GOAL MET 10/09/22 (51)  2.  Pt. Will increase B LE muscle strength 1/2 muscle grade to improve pain-free mobility/ walking.   Baseline:  See above Goal status: Partially met (see above for updated MMT)  3.  Pt. Able to ambulate 200 feet with mod. I with least assistive device to improve return to home.   Baseline: amb. Short distances  with RW/ min. A from PT.  Limited endurance Goal status: Partially met (pt able to ambulate ~125 feet continuously with RW 10/09/2022)   PLAN:  PT FREQUENCY: 2x/week  PT DURATION: 6 weeks  PLANNED INTERVENTIONS: Therapeutic exercises, Therapeutic activity, Neuromuscular re-education, Balance training, Gait training, Patient/Family education, Self Care, Joint mobilization, Stair training, Prosthetic training, DME instructions, Manual therapy, and Re-evaluation  PLAN FOR NEXT SESSION: Progress gait training with unilateral UE support as tolerated, progress static/dynamic balance exercises, continue with eccentric quad strengthening, fast-twitch muscle exercises (static repeats with metronome), progress gait training with quad cane as able (emphasize R hip extension and LLE step length)  Shawnique Mariotti, SPT 11/01/22, 5:22 PM

## 2022-11-06 ENCOUNTER — Encounter: Payer: Self-pay | Admitting: Physical Therapy

## 2022-11-06 ENCOUNTER — Ambulatory Visit: Payer: Medicare Other | Admitting: Physical Therapy

## 2022-11-06 DIAGNOSIS — Z89511 Acquired absence of right leg below knee: Secondary | ICD-10-CM

## 2022-11-06 DIAGNOSIS — R269 Unspecified abnormalities of gait and mobility: Secondary | ICD-10-CM

## 2022-11-06 DIAGNOSIS — M6281 Muscle weakness (generalized): Secondary | ICD-10-CM

## 2022-11-06 NOTE — Therapy (Unsigned)
OUTPATIENT PHYSICAL THERAPY LOWER EXTREMITY TREATMENT  Patient Name: Melanie Juarez MRN: [id number] DOB:1945-03-30, 77 y.o., female Today's Date: 11/06/2022  END OF SESSION:  PT End of Session - 11/06/22 1256     Visit Number 18    Number of Visits 28    Date for PT Re-Evaluation 12/04/22    PT Start Time 1255    PT Stop Time 1345    PT Time Calculation (min) 50 min    Equipment Utilized During Treatment Gait belt    Activity Tolerance Patient tolerated treatment well    Behavior During Therapy WFL for tasks assessed/performed             Past Medical History:  Diagnosis Date   Diabetes (HCC)    Hypertension    Past Surgical History:  Procedure Laterality Date   AMPUTATION Right 03/22/2022   Procedure: AMPUTATION BELOW KNEE-Guillotine;  Surgeon: Annice Needy, MD;  Location: [facility] ORS;  Service: Vascular;  Laterality: Right;   LOWER EXTREMITY ANGIOGRAPHY Right 03/21/2022   Procedure: Lower Extremity Angiography;  Surgeon: Annice Needy, MD;  Location: [facility];  Service: Cardiovascular;  Laterality: Right;   STUMP REVISION Right 03/26/2022   Procedure: BKA REVISION;  Surgeon: Annice Needy, MD;  Location: [facility] OR[facility];  Service: General;  Laterality: Right;   Patient Active Problem List   Diagnosis Date Noted   Hx of BKA, right (HCC) 08/08/2022   Lactic acidosis 03/21/2022   Obesity (BMI 30-39.9) 03/21/2022   PAD (peripheral artery disease) (HCC) 03/21/2022   Osteomyelitis (HCC) 03/19/2022   Diabetic infection of right foot (HCC) 03/19/2022   Essential hypertension 03/19/2022   Leukocytosis 03/19/2022   AKI (acute kidney injury) (HCC) 03/19/2022   Sepsis with acute renal failure without septic shock (HCC) 03/19/2022   Hyperlipidemia 05/17/2014   Type 2 diabetes mellitus (HCC) 05/17/2014   Anemia, unspecified 05/18/2013   Benign essential hypertension 05/18/2013   REFERRING PROVIDER: Annice Needy, MD  REFERRING DIAG: s/p R BKA  THERAPY DIAG:  Hx of BKA, right  (HCC)  Muscle weakness (generalized)  Gait difficulty  Rationale for Evaluation and Treatment: Rehabilitation  ONSET DATE: 03/22/22  SUBJECTIVE:   SUBJECTIVE STATEMENT: Pt. Had a sore on R foot resulting in R BKA on 03/22/22.  Pt. Entered Pt with use of w/c and daughter Melanie Juarez) present.  Pt. Wants to return to living independently.  Pt. Completing HEP from rehab center.  Pt. Has been unable to get into shower.    PERTINENT HISTORY: Melanie Juarez is a 77 y.o. (04/14/1945) female who presents with chief complaint of     Chief Complaint  Patient presents with   Venous Insufficiency  .   History of Present Illness: Patient returns today in follow up of her right BKA.  It is healed.  She is in the process of getting her prosthesis now.  She is doing well.  No pain.  No left leg ulceration or rest pain.    Assessment/Plan   PAD (peripheral artery disease) (HCC) Check left ABI on follow up visit in 3 months.    Type 2 diabetes mellitus (HCC) blood glucose control important in reducing the progression of atherosclerotic disease. Also, involved in wound healing. On appropriate medications.    Essential hypertension blood pressure control important in reducing the progression of atherosclerotic disease. On appropriate oral medications.    Hx of BKA, right (HCC) Healed.  In the process of getting her prosthesis.  A new prescription  given for physical therapy for prosthesis and ambulatory training.   Melanie Barren, MD   08/08/2022 10:42 AM  PAIN:  Are you having pain? No.  Pt. Reports phantom limb symptoms but not pain.    PRECAUTIONS: Fall  RED FLAGS: None   WEIGHT BEARING RESTRICTIONS: No  FALLS:  Has patient fallen in last 6 months? No  LIVING ENVIRONMENT: Lives with: lives with their daughter  Melanie Juarez).   Lives in: House/apartment Stairs: No Has following equipment at home: Dan Humphreys - 2 wheeled, Wheelchair (manual), and Ramped entry  OCCUPATION: Retired  PLOF:  Independent.  Pt. Does not drive.    PATIENT GOALS: Ambulate with least assistive device.  Return home independently/ go back to church.    NEXT MD VISIT: Melanie Juarez 7/31.  MD f/u in September?  OBJECTIVE:   PATIENT SURVEYS:  FOTO initial 29/ goal 65.    COGNITION: Overall cognitive status: Within functional limits for tasks assessed     SENSATION: WFL  EDEMA:  No R residual limb swelling noted.  PT discussed use of shrinker and proper use of ply socks for improved fit of prosthetic leg.    POSTURE: rounded shoulders and forward head  PALPATION: Minimal tenderness along R distal residual limb/ incision.  Good incision healing/ PT discussed benefits of scar massage.    LOWER EXTREMITY ROM:  Active ROM Right eval Left eval  Hip flexion [facility] WFL  Hip extension NT NT  Hip abduction [facility]  Hip adduction    Hip internal rotation    Hip external rotation    Knee flexion [facility] WFL  Knee extension -3 deg. 0 deg.  Ankle dorsiflexion    Ankle plantarflexion    Ankle inversion    Ankle eversion     (Blank rows = not tested)  LOWER EXTREMITY MMT:  MMT Right eval Left eval  Hip flexion 4/5 4/5  Hip extension    Hip abduction 3+/5 4/5  Hip adduction    Hip internal rotation    Hip external rotation    Knee flexion 4+/5  4+/5  Knee extension 4+/5 5/5  Ankle dorsiflexion    Ankle plantarflexion    Ankle inversion    Ankle eversion     (Blank rows = not tested)   B UE AROM WFL.  B UE strength grossly 4/5 MMT except bicep/tricep 4+/5 MMT  FUNCTIONAL TESTS:  5 times sit to stand: TBD Timed up and go (TUG): TBD 6 minute walk test: TBD  GAIT: Distance walked: 45 feet in gym with RW/ min. A for safety and cuing Assistive device utilized: Walker - 2 wheeled Level of assistance: Min A Comments: amb. In gym with use of RW/min A for cuing to increase step length/ heel strike.  Pt. Fearful while walking outside of //-bars.  Pt. Ambs. In //-bars with B UE assist and  increase cadence/ step pattern.  Use of mirror for posture correction/ heel strike (pt. Does not looking at a mirror).     TODAY'S TREATMENT:  DATE: 11/06/2022  Subjective: Pt. entered PT with use of RW and prosthetic leg donned.  Pt reports no pain this afternoon; she still experiences occasional phantom limb pain in her RLE.   Treatment:    There.ex.:  Nustep level 5 (seat 10) x 8 minutes to improve cardiorespiratory endurance.   Static standing rapid R hip repeats: 1x1 minute holds each leg for flexion  Lateral 6-inch Step Downs: 2x1 minutes each leg to emphasize eccentric control with stairs, BUE support on rails, VC's to use LE's more than UE's  Gait training:   Walking in // bars with only quad-cane in RUE: 4 laps - VC's for correct sequencing cane with prosthetic limb, increasing step length on LLE.  Resisted anterior/posterior weightshifting in // bars: black TB - to improve loading response, 2 x 1 minute bouts in tandem stance with each leg forward  Walking along agility ladder for symmetrical step length: 2 down/backs with RW, 2 down/backs with quad cane in LUE   Not Today Seated LAQ/marching 20x.  Discussed importance of R knee extension.    Static hip extension in standing in // bars: 3x1 minute, followed by fwd/back walking in // bars, then with RW in clinic for functional movement carry-over  Narrow Stance in // bars with functional reach: reaching for cones forward on lower surface, 2 x 8 reaches each arm in different directions - No UE support, CGA for safety  6" Lateral step ups on L/R with B UE assist on handrails: 4 bouts of up/down 2 stairs each leg   Sit to stands from chair: x5 reps from chair with BUE support - attempted with unilateral UE support on stairs but pt unable to complete independently  Walking in // bars with step over (6  inch hurdles)- L UE assist, 4 laps each of fwd stepping with each foot  Standing L/R hip abduction with light UE assist on handrails 10x2 each.   Amb. around gym with use of RW working on consistent recip. Step pattern.  Cuing to increase L LE step length.   Walking in //-bars with alt. Cone taps with B UE assist, progress with use of L UE only.  2 laps/ challenged with R LE cone taps with L UE only.  No LOB.    PATIENT EDUCATION:  Education details: Discussed HEP/ standing hip ex. At [employer] Person educated: Patient and Child(ren) Education method: Medical illustrator Education comprehension: verbalized understanding and returned demonstration  HOME EXERCISE PROGRAM: Access Code: [email or web address] URL: [email or web address] Date: 08/30/2022 Prepared by: Maylon Peppers  Exercises - Sit to Stand with Counter Support  - 1 x daily - 5 x weekly - 3 sets - 10 reps - Standing Hip Abduction with Counter Support  - 1 x daily - 5 x weekly - 3 sets - 10 reps - Standing March with Counter Support  - 1 x daily - 5 x weekly - 3 sets - 10 reps - Standing Hip Extension with Counter Support  - 1 x daily - 5 x weekly - 3 sets - 10 reps  ASSESSMENT:  CLINICAL IMPRESSION:   Pt arrives to PT with no reports of pain in her R LE. Gait training exercises today consisted of walking with quad cane while emphasizing increased LLE step length and weight shifting to the R side. Ambulation with quad cane trialed outside // bars again today; pt continues to demonstrate step-to pattern and more cautious gait pattern when ambulating outside // bars with quad cane. Walking along  floor ladder performed with RW and quad cane to emphasize symmetrical step length; pt did better on last set with quad cane and was better able to utilize a reciprocal gait pattern; still asymmetrical with step length but improved. Patient will continue to benefit from skilled PT services to increase B LE muscle strength to  improve independence/safety with prosthetic/gait training.    OBJECTIVE IMPAIRMENTS: Abnormal gait, decreased activity tolerance, decreased balance, decreased endurance, decreased mobility, difficulty walking, decreased ROM, decreased strength, decreased safety awareness, improper body mechanics, postural dysfunction, and pain.   ACTIVITY LIMITATIONS: carrying, lifting, standing, squatting, stairs, transfers, bed mobility, bathing, toileting, dressing, and locomotion level  PARTICIPATION LIMITATIONS: meal prep, cleaning, laundry, shopping, community activity, and church  PERSONAL FACTORS: Fitness and Past/current experiences are also affecting patient's functional outcome.   REHAB POTENTIAL: Good  CLINICAL DECISION MAKING: Evolving/moderate complexity  EVALUATION COMPLEXITY: Moderate   GOALS: Goals reviewed with patient? Yes  SHORT TERM GOALS: Target date: 09/18/22 Pt. Able to tolerate standing 10 minutes in kitchen with use of prosthetic leg to improve meal preparation.   Baseline:  limited standing tolerance Goal status: Goal met   LONG TERM GOALS: Target date: 12/04/22  Pt. Will increase FOTO to 46 to improve pain-free mobility.   Baseline: initial FOTO 29 Goal status: GOAL MET 10/09/22 (51)  2.  Pt. Will increase B LE muscle strength 1/2 muscle grade to improve pain-free mobility/ walking.   Baseline:  See above Goal status: Partially met (see above for updated MMT)  3.  Pt. Able to ambulate 200 feet with mod. I with least assistive device to improve return to home.   Baseline: amb. Short distances with RW/ min. A from PT.  Limited endurance Goal status: Partially met (pt able to ambulate ~125 feet continuously with RW 10/09/2022)   PLAN:  PT FREQUENCY: 2x/week  PT DURATION: 6 weeks  PLANNED INTERVENTIONS: Therapeutic exercises, Therapeutic activity, Neuromuscular re-education, Balance training, Gait training, Patient/Family education, Self Care, Joint mobilization,  Stair training, Prosthetic training, DME instructions, Manual therapy, and Re-evaluation  PLAN FOR NEXT SESSION: Progress gait training with unilateral UE support as tolerated, progress static/dynamic balance exercises, continue with eccentric quad strengthening, fast-twitch muscle exercises (static repeats with metronome), progress gait training with quad cane as able (emphasize R hip extension and LLE step length)  Cammie Mcgee, PT, DPT # [phone] Cena Benton, SPT 11/06/22, 2:33 PM

## 2022-11-07 ENCOUNTER — Ambulatory Visit (INDEPENDENT_AMBULATORY_CARE_PROVIDER_SITE_OTHER): Payer: Medicare Other | Admitting: Nurse Practitioner

## 2022-11-07 ENCOUNTER — Encounter (INDEPENDENT_AMBULATORY_CARE_PROVIDER_SITE_OTHER): Payer: Medicare Other

## 2022-11-08 ENCOUNTER — Encounter: Payer: Self-pay | Admitting: Physical Therapy

## 2022-11-08 ENCOUNTER — Ambulatory Visit: Payer: Medicare Other | Admitting: Physical Therapy

## 2022-11-08 DIAGNOSIS — R269 Unspecified abnormalities of gait and mobility: Secondary | ICD-10-CM

## 2022-11-08 DIAGNOSIS — Z89511 Acquired absence of right leg below knee: Secondary | ICD-10-CM | POA: Diagnosis not present

## 2022-11-08 DIAGNOSIS — M6281 Muscle weakness (generalized): Secondary | ICD-10-CM

## 2022-11-08 NOTE — Therapy (Signed)
OUTPATIENT PHYSICAL THERAPY LOWER EXTREMITY TREATMENT  Patient Name: Melanie Juarez MRN: [id number] DOB:05/11/1945, 77 y.o., female Today's Date: 11/08/2022  END OF SESSION:  PT End of Session - 11/08/22 1354     Visit Number 19    Number of Visits 28    Date for PT Re-Evaluation 12/04/22    PT Start Time 1259    PT Stop Time 1349    PT Time Calculation (min) 50 min    Equipment Utilized During Treatment Gait belt    Activity Tolerance Patient tolerated treatment well    Behavior During Therapy WFL for tasks assessed/performed             Past Medical History:  Diagnosis Date   Diabetes (HCC)    Hypertension    Past Surgical History:  Procedure Laterality Date   AMPUTATION Right 03/22/2022   Procedure: AMPUTATION BELOW KNEE-Guillotine;  Surgeon: Annice Needy, MD;  Location: [facility] ORS;  Service: Vascular;  Laterality: Right;   LOWER EXTREMITY ANGIOGRAPHY Right 03/21/2022   Procedure: Lower Extremity Angiography;  Surgeon: Annice Needy, MD;  Location: [facility];  Service: Cardiovascular;  Laterality: Right;   STUMP REVISION Right 03/26/2022   Procedure: BKA REVISION;  Surgeon: Annice Needy, MD;  Location: [facility] OR[facility];  Service: General;  Laterality: Right;   Patient Active Problem List   Diagnosis Date Noted   Hx of BKA, right (HCC) 08/08/2022   Lactic acidosis 03/21/2022   Obesity (BMI 30-39.9) 03/21/2022   PAD (peripheral artery disease) (HCC) 03/21/2022   Osteomyelitis (HCC) 03/19/2022   Diabetic infection of right foot (HCC) 03/19/2022   Essential hypertension 03/19/2022   Leukocytosis 03/19/2022   AKI (acute kidney injury) (HCC) 03/19/2022   Sepsis with acute renal failure without septic shock (HCC) 03/19/2022   Hyperlipidemia 05/17/2014   Type 2 diabetes mellitus (HCC) 05/17/2014   Anemia, unspecified 05/18/2013   Benign essential hypertension 05/18/2013   REFERRING PROVIDER: Annice Needy, MD  REFERRING DIAG: s/p R BKA  THERAPY DIAG:  Hx of BKA, right  (HCC)  Muscle weakness (generalized)  Gait difficulty  Rationale for Evaluation and Treatment: Rehabilitation  ONSET DATE: 03/22/22  SUBJECTIVE:   SUBJECTIVE STATEMENT: Pt. Had a sore on R foot resulting in R BKA on 03/22/22.  [name]  Pt. Wants to return to living independently.  Pt. Completing HEP from rehab center.  Pt. Has been unable to get into shower.    PERTINENT HISTORY: Melanie Juarez is a 77 y.o. (Jun 04, 1945) female who presents with chief complaint of     Chief Complaint  Patient presents with   Venous Insufficiency  .   History of Present Illness: Patient returns today in follow up of her right BKA.  It is healed.  She is in the process of getting her prosthesis now.  She is doing well.  No pain.  No left leg ulceration or rest pain.    Assessment/Plan   PAD (peripheral artery disease) (HCC) Check left ABI on follow up visit in 3 months.    Type 2 diabetes mellitus (HCC) blood glucose control important in reducing the progression of atherosclerotic disease. Also, involved in wound healing. On appropriate medications.    Essential hypertension blood pressure control important in reducing the progression of atherosclerotic disease. On appropriate oral medications.    Hx of BKA, right (HCC) Healed.  In the process of getting her prosthesis.  A new prescription  given for physical therapy for prosthesis and ambulatory training.   [name], MD   08/08/2022 10:42 AM  PAIN:  Are you having pain? No.  Pt. Reports phantom limb symptoms but not pain.    PRECAUTIONS: Fall  RED FLAGS: None   WEIGHT BEARING RESTRICTIONS: No  FALLS:  Has patient fallen in last 6 months? No  LIVING ENVIRONMENT: Lives with: lives with their daughter  [name].   Lives in: House/apartment Stairs: No Has following equipment at home: Dan Humphreys - 2 wheeled, Wheelchair (manual), and Ramped entry  OCCUPATION: Retired  PLOF:  Independent.  Pt. Does not drive.    PATIENT GOALS: Ambulate with least assistive device.  Return home independently/ go back to church.    NEXT MD VISIT: [name] 7/31.  MD f/u in September?  OBJECTIVE:   PATIENT SURVEYS:  FOTO initial 29/ goal 44.    COGNITION: Overall cognitive status: Within functional limits for tasks assessed     SENSATION: WFL  EDEMA:  No R residual limb swelling noted.  PT discussed use of shrinker and proper use of ply socks for improved fit of prosthetic leg.    POSTURE: rounded shoulders and forward head  PALPATION: Minimal tenderness along R distal residual limb/ incision.  Good incision healing/ PT discussed benefits of scar massage.    LOWER EXTREMITY ROM:  Active ROM Right eval Left eval  Hip flexion [facility] WFL  Hip extension NT NT  Hip abduction [facility]  Hip adduction    Hip internal rotation    Hip external rotation    Knee flexion [facility] WFL  Knee extension -3 deg. 0 deg.  Ankle dorsiflexion    Ankle plantarflexion    Ankle inversion    Ankle eversion     (Blank rows = not tested)  LOWER EXTREMITY MMT:  MMT Right eval Left eval  Hip flexion 4/5 4/5  Hip extension    Hip abduction 3+/5 4/5  Hip adduction    Hip internal rotation    Hip external rotation    Knee flexion 4+/5  4+/5  Knee extension 4+/5 5/5  Ankle dorsiflexion    Ankle plantarflexion    Ankle inversion    Ankle eversion     (Blank rows = not tested)   B UE AROM WFL.  B UE strength grossly 4/5 MMT except bicep/tricep 4+/5 MMT  FUNCTIONAL TESTS:  5 times sit to stand: TBD Timed up and go (TUG): TBD 6 minute walk test: TBD  GAIT: Distance walked: 45 feet in gym with RW/ min. A for safety and cuing Assistive device utilized: Walker - 2 wheeled Level of assistance: Min A Comments: amb. In gym with use of RW/min A for cuing to increase step length/ heel strike.  Pt. Fearful while walking outside of //-bars.  Pt. Ambs. In //-bars with B UE assist and  increase cadence/ step pattern.  Use of mirror for posture correction/ heel strike (pt. Does not looking at a mirror).     TODAY'S TREATMENT:  DATE: 11/08/2022  Subjective: Pt. entered PT with use of RW and prosthetic leg donned.  Pt reports no pain this afternoon but states prosthetic leg feels off today.  PT removed prosthetic leg and reapplied ply socks/ leg prior to start of walking in //-bars.    Treatment:    There.ex.:   No Nustep today.  Gait training:   Walking in // bars with B UE assist on bars working on recip. Gait pattern/ upright posture. Pt. Progressing to L UE only and then only quad-cane in LUE: 4 laps - VC's for correct sequencing cane with prosthetic limb, increasing step length on LLE.  Walking in //-bars with alt. Cone taps with B UE assist, progress with use of L UE only.  2 laps/ challenged with R LE cone taps with L UE only but no LOB.  Pt. Highly motivated to progress to use of SPC only outside of //-bars.    Walking in gym with use of QC only and focus on consistent recip. Gait pattern.  Limited L step length and challenged with maintaining proper BOS.  Occasional R UE assist by PT to maintain balance.  No LOB.  Ambulate 20 feet x 2 (3 laps total).    Ascend/descend 4 steps with recip. Gait pattern and L handrail only.  Good step ups on R LE.    Amb. From stairs to car in front of building with use of QC and CGA for safety/ cuing to increase L LE step length.     PATIENT EDUCATION:  Education details: Discussed HEP/ standing hip ex. At [employer] Person educated: Patient and Child(ren) Education method: Medical illustrator Education comprehension: verbalized understanding and returned demonstration  HOME EXERCISE PROGRAM: Access Code: [email or web address] URL: [email or web address] Date: 08/30/2022 Prepared by:  Maylon Peppers  Exercises - Sit to Stand with Counter Support  - 1 x daily - 5 x weekly - 3 sets - 10 reps - Standing Hip Abduction with Counter Support  - 1 x daily - 5 x weekly - 3 sets - 10 reps - Standing March with Counter Support  - 1 x daily - 5 x weekly - 3 sets - 10 reps - Standing Hip Extension with Counter Support  - 1 x daily - 5 x weekly - 3 sets - 10 reps  ASSESSMENT:  CLINICAL IMPRESSION:   Pt arrives to PT with no reports of pain in her R LE. Gait training exercises today consisted of walking with quad cane while emphasizing increased LLE step length and weight shifting to the R side. Ambulation with quad cane trialed outside // bars again today; pt continues to demonstrate step-to pattern and more cautious gait pattern when ambulating outside // bars with quad cane.  Pt. Highly motivated to progress to use of cane at home.  Patient will continue to benefit from skilled PT services to increase B LE muscle strength to improve independence/safety with prosthetic/gait training.    OBJECTIVE IMPAIRMENTS: Abnormal gait, decreased activity tolerance, decreased balance, decreased endurance, decreased mobility, difficulty walking, decreased ROM, decreased strength, decreased safety awareness, improper body mechanics, postural dysfunction, and pain.   ACTIVITY LIMITATIONS: carrying, lifting, standing, squatting, stairs, transfers, bed mobility, bathing, toileting, dressing, and locomotion level  PARTICIPATION LIMITATIONS: meal prep, cleaning, laundry, shopping, community activity, and church  PERSONAL FACTORS: Fitness and Past/current experiences are also affecting patient's functional outcome.   REHAB POTENTIAL: Good  CLINICAL DECISION MAKING: Evolving/moderate complexity  EVALUATION COMPLEXITY: Moderate   GOALS: Goals reviewed with patient? Yes  SHORT TERM GOALS: Target date: 09/18/22 Pt. Able to tolerate standing 10 minutes in kitchen with use of prosthetic leg to improve meal  preparation.   Baseline:  limited standing tolerance Goal status: Goal met   LONG TERM GOALS: Target date: 12/04/22  Pt. Will increase FOTO to 46 to improve pain-free mobility.   Baseline: initial FOTO 29 Goal status: GOAL MET 10/09/22 (51)  2.  Pt. Will increase B LE muscle strength 1/2 muscle grade to improve pain-free mobility/ walking.   Baseline:  See above Goal status: Partially met (see above for updated MMT)  3.  Pt. Able to ambulate 200 feet with mod. I with least assistive device to improve return to home.   Baseline: amb. Short distances with RW/ min. A from PT.  Limited endurance Goal status: Partially met (pt able to ambulate ~125 feet continuously with RW 10/09/2022)   PLAN:  PT FREQUENCY: 2x/week  PT DURATION: 6 weeks  PLANNED INTERVENTIONS: Therapeutic exercises, Therapeutic activity, Neuromuscular re-education, Balance training, Gait training, Patient/Family education, Self Care, Joint mobilization, Stair training, Prosthetic training, DME instructions, Manual therapy, and Re-evaluation  PLAN FOR NEXT SESSION: Progress gait training with unilateral UE support as tolerated, progress static/dynamic balance exercises, continue with eccentric quad strengthening, fast-twitch muscle exercises (static repeats with metronome), progress gait training with quad cane as able (emphasize R hip extension and LLE step length)  Cammie Mcgee, PT, DPT # [phone] 11/08/22, 1:55 PM

## 2022-11-13 ENCOUNTER — Ambulatory Visit: Payer: Medicare HMO | Attending: Vascular Surgery

## 2022-11-13 ENCOUNTER — Encounter: Payer: Self-pay | Admitting: Physical Therapy

## 2022-11-13 DIAGNOSIS — M6281 Muscle weakness (generalized): Secondary | ICD-10-CM | POA: Insufficient documentation

## 2022-11-13 DIAGNOSIS — Z89511 Acquired absence of right leg below knee: Secondary | ICD-10-CM | POA: Insufficient documentation

## 2022-11-13 DIAGNOSIS — R269 Unspecified abnormalities of gait and mobility: Secondary | ICD-10-CM | POA: Diagnosis not present

## 2022-11-13 NOTE — Therapy (Signed)
OUTPATIENT PHYSICAL THERAPY LOWER EXTREMITY TREATMENT Physical Therapy Progress Note   Dates of reporting period  10/04/22   to   11/13/22    Patient Name: Melanie Juarez MRN: [id number] DOB:03-06-1945, 77 y.o., female Today's Date: 11/13/2022  END OF SESSION:  PT End of Session - 11/13/22 1344     Visit Number 20    Number of Visits 28    Date for PT Re-Evaluation 12/04/22    PT Start Time 1345    PT Stop Time 1438    PT Time Calculation (min) 53 min    Equipment Utilized During Treatment Gait belt    Activity Tolerance Patient tolerated treatment well    Behavior During Therapy WFL for tasks assessed/performed              Past Medical History:  Diagnosis Date   Diabetes (HCC)    Hypertension    Past Surgical History:  Procedure Laterality Date   AMPUTATION Right 03/22/2022   Procedure: AMPUTATION BELOW KNEE-Guillotine;  Surgeon: Annice Needy, MD;  Location: [facility];  Service: Vascular;  Laterality: Right;   LOWER EXTREMITY ANGIOGRAPHY Right 03/21/2022   Procedure: Lower Extremity Angiography;  Surgeon: Annice Needy, MD;  Location: [facility];  Service: Cardiovascular;  Laterality: Right;   STUMP REVISION Right 03/26/2022   Procedure: BKA REVISION;  Surgeon: Annice Needy, MD;  Location: [facility];  Service: General;  Laterality: Right;   Patient Active Problem List   Diagnosis Date Noted   Hx of BKA, right (HCC) 08/08/2022   Lactic acidosis 03/21/2022   Obesity (BMI 30-39.9) 03/21/2022   PAD (peripheral artery disease) (HCC) 03/21/2022   Osteomyelitis (HCC) 03/19/2022   Diabetic infection of right foot (HCC) 03/19/2022   Essential hypertension 03/19/2022   Leukocytosis 03/19/2022   AKI (acute kidney injury) (HCC) 03/19/2022   Sepsis with acute renal failure without septic shock (HCC) 03/19/2022   Hyperlipidemia 05/17/2014   Type 2 diabetes mellitus (HCC) 05/17/2014   Anemia, unspecified 05/18/2013   Benign essential hypertension 05/18/2013   REFERRING  PROVIDER: Annice Needy, MD  REFERRING DIAG: s/p R BKA  THERAPY DIAG:  Hx of BKA, right (HCC)  Muscle weakness (generalized)  Gait difficulty  Rationale for Evaluation and Treatment: Rehabilitation  ONSET DATE: 03/22/22  SUBJECTIVE:   SUBJECTIVE STATEMENT: Pt. Had a sore on R foot resulting in R BKA on 03/22/22.  [name]  Pt. Wants to return to living independently.  Pt. Completing HEP from rehab center.  Pt. Has been unable to get into shower.    PERTINENT HISTORY: Melanie Juarez is a 77 y.o. (Jun 13, 1945) female who presents with chief complaint of     Chief Complaint  Patient presents with   Venous Insufficiency  .   History of Present Illness: Patient returns today in follow up of her right BKA.  It is healed.  She is in the process of getting her prosthesis now.  She is doing well.  No pain.  No left leg ulceration or rest pain.    Assessment/Plan   PAD (peripheral artery disease) (HCC) Check left ABI on follow up visit in 3 months.    Type 2 diabetes mellitus (HCC) blood glucose control important in reducing the progression of atherosclerotic disease. Also, involved in wound healing. On appropriate medications.    Essential hypertension blood pressure control important in reducing the progression of atherosclerotic disease. On appropriate oral medications.  Hx of BKA, right (HCC) Healed.  In the process of getting her prosthesis.  A new prescription given for physical therapy for prosthesis and ambulatory training.   [name], MD   08/08/2022 10:42 AM  PAIN:  Are you having pain? No.  Pt. Reports phantom limb symptoms but not pain.    PRECAUTIONS: Fall  RED FLAGS: None   WEIGHT BEARING RESTRICTIONS: No  FALLS:  Has patient fallen in last 6 months? No  LIVING ENVIRONMENT: Lives with: lives with their daughter  [name].   Lives in: House/apartment Stairs: No Has following equipment at home: Dan Humphreys -  2 wheeled, Wheelchair (manual), and Ramped entry  OCCUPATION: Retired  PLOF: Independent.  Pt. Does not drive.    PATIENT GOALS: Ambulate with least assistive device.  Return home independently/ go back to church.    NEXT MD VISIT: [name] 7/31.  MD f/u in September?  OBJECTIVE:   PATIENT SURVEYS:  FOTO initial 29/ goal 48.    COGNITION: Overall cognitive status: Within functional limits for tasks assessed     SENSATION: WFL  EDEMA:  No R residual limb swelling noted.  PT discussed use of shrinker and proper use of ply socks for improved fit of prosthetic leg.    POSTURE: rounded shoulders and forward head  PALPATION: Minimal tenderness along R distal residual limb/ incision.  Good incision healing/ PT discussed benefits of scar massage.    LOWER EXTREMITY ROM:  Active ROM Right eval Left eval  Hip flexion [facility] WFL  Hip extension NT NT  Hip abduction [facility]  Hip adduction    Hip internal rotation    Hip external rotation    Knee flexion [facility] WFL  Knee extension -3 deg. 0 deg.  Ankle dorsiflexion    Ankle plantarflexion    Ankle inversion    Ankle eversion     (Blank rows = not tested)  LOWER EXTREMITY MMT:  MMT Right eval Left eval  Hip flexion 4/5 4/5  Hip extension    Hip abduction 3+/5 4/5  Hip adduction    Hip internal rotation    Hip external rotation    Knee flexion 4+/5  4+/5  Knee extension 4+/5 5/5  Ankle dorsiflexion    Ankle plantarflexion    Ankle inversion    Ankle eversion     (Blank rows = not tested)   B UE AROM WFL.  B UE strength grossly 4/5 MMT except bicep/tricep 4+/5 MMT  FUNCTIONAL TESTS:  5 times sit to stand: TBD Timed up and go (TUG): TBD 6 minute walk test: TBD  GAIT: Distance walked: 45 feet in gym with RW/ min. A for safety and cuing Assistive device utilized: Jovanka Westgate - 2 wheeled Level of assistance: Min A Comments: amb. In gym with use of RW/min A for cuing to increase step length/ heel strike.  Pt. Fearful  while walking outside of //-bars.  Pt. Ambs. In //-bars with B UE assist and increase cadence/ step pattern.  Use of mirror for posture correction/ heel strike (pt. Does not looking at a mirror).     TODAY'S TREATMENT:  DATE: 11/13/2022  Subjective:  Pt. entered PT with use of RW and prosthetic leg donned.  Pt reports no pain this afternoon.   Treatment:   There.ex.:  Nustep level 5 (seat 10) x 8 minutes to improve cardiorespiratory endurance.  Gait training:   Fwd step taps on 6" step 2 x 10 each LE with single UE support   Ascend/descend 2 x 4 steps with recip. Gait pattern and L handrail only.  Good step ups on R LE.    Walking in gym with use of QC only and focus on consistent recip. Gait pattern.  Limited L step length  and challenged with maintaining proper BOS.  Occasional R UE assist by PT to maintain balance.  No LOB.  Ambulate from Nustep to stairs (and back) then Nustep to // bars    Walking in // bars with B UE assist on bars working on recip. Gait pattern/ upright posture. Pt. Progressing to L UE only and then only quad-cane in LUE: 4 laps - VC's for correct sequencing cane with prosthetic limb, increasing step length on LLE.  Amb. From // bars to car on side of building with use of QC and CGA for safety/ cuing to increase L LE step length and cane sequencing.  X1 LOB requiring minA to recover  Not today: Walking in //-bars with alt. Cone taps with B UE assist, progress with use of L UE only.  2 laps/ challenged with R LE cone taps with L UE only but no LOB.  Pt. Highly motivated to progress to use of SPC only outside of //-bars.    PATIENT EDUCATION:  Education details: Discussed HEP/ standing hip ex. At [employer] Person educated: Patient and Child(ren) Education method: Medical illustrator Education comprehension: verbalized  understanding and returned demonstration  HOME EXERCISE PROGRAM: Access Code: [email or web address] URL: [email or web address] Date: 08/30/2022 Prepared by: Maylon Peppers  Exercises - Sit to Stand with Counter Support  - 1 x daily - 5 x weekly - 3 sets - 10 reps - Standing Hip Abduction with Counter Support  - 1 x daily - 5 x weekly - 3 sets - 10 reps - Standing March with Counter Support  - 1 x daily - 5 x weekly - 3 sets - 10 reps - Standing Hip Extension with Counter Support  - 1 x daily - 5 x weekly - 3 sets - 10 reps  ASSESSMENT:  CLINICAL IMPRESSION:   Pt arrives to PT with no reports of pain in her R LE. Gait training exercises today consisted of walking with quad cane while emphasizing increased LLE step length and weight shifting to the R side. Ambulation with quad cane trialed outside // bars again today; pt continues to demonstrate step-to pattern consistently with intermittent step through along with more cautious gait pattern when ambulating outside // bars with quad cane.  Pt. Highly motivated to progress to use of cane at home.  Patient will continue to benefit from skilled PT services to increase B LE muscle strength to improve independence/safety with prosthetic/gait training.    OBJECTIVE IMPAIRMENTS: Abnormal gait, decreased activity tolerance, decreased balance, decreased endurance, decreased mobility, difficulty walking, decreased ROM, decreased strength, decreased safety awareness, improper body mechanics, postural dysfunction, and pain.   ACTIVITY LIMITATIONS: carrying, lifting, standing, squatting, stairs, transfers, bed mobility, bathing, toileting, dressing, and locomotion level  PARTICIPATION LIMITATIONS: meal prep, cleaning, laundry, shopping, community activity, and church  PERSONAL FACTORS: Fitness and Past/current experiences are also affecting patient's functional outcome.  REHAB POTENTIAL: Good  CLINICAL DECISION MAKING: Evolving/moderate  complexity  EVALUATION COMPLEXITY: Moderate   GOALS: Goals reviewed with patient? Yes  SHORT TERM GOALS: Target date: 09/18/22 Pt. Able to tolerate standing 10 minutes in kitchen with use of prosthetic leg to improve meal preparation.   Baseline:  limited standing tolerance Goal status: Goal met   LONG TERM GOALS: Target date: 12/04/22  Pt. Will increase FOTO to 46 to improve pain-free mobility.   Baseline: initial FOTO 29 Goal status: GOAL MET 10/09/22 (51)  2.  Pt. Will increase B LE muscle strength 1/2 muscle grade to improve pain-free mobility/ walking.   Baseline:  See above Goal status: Partially met (see above for updated MMT)  3.  Pt. Able to ambulate 200 feet with mod. I with least assistive device to improve return to home.   Baseline: amb. Short distances with RW/ min. A from PT.  Limited endurance Goal status: Partially met (pt able to ambulate ~125 feet continuously with RW 10/09/2022)   PLAN:  PT FREQUENCY: 2x/week  PT DURATION: 6 weeks  PLANNED INTERVENTIONS: Therapeutic exercises, Therapeutic activity, Neuromuscular re-education, Balance training, Gait training, Patient/Family education, Self Care, Joint mobilization, Stair training, Prosthetic training, DME instructions, Manual therapy, and Re-evaluation  PLAN FOR NEXT SESSION: Progress gait training with unilateral UE support as tolerated, progress static/dynamic balance exercises, continue with eccentric quad strengthening, fast-twitch muscle exercises (static repeats with metronome), progress gait training with quad cane as able (emphasize R hip extension and LLE step length)  Maylon Peppers, PT, DPT Physical Therapist - [facility]  11/13/22, 2:39 PM

## 2022-11-15 ENCOUNTER — Ambulatory Visit: Payer: Medicare HMO

## 2022-11-15 ENCOUNTER — Encounter: Payer: Self-pay | Admitting: Physical Therapy

## 2022-11-15 DIAGNOSIS — R269 Unspecified abnormalities of gait and mobility: Secondary | ICD-10-CM

## 2022-11-15 DIAGNOSIS — Z89511 Acquired absence of right leg below knee: Secondary | ICD-10-CM | POA: Diagnosis not present

## 2022-11-15 DIAGNOSIS — M6281 Muscle weakness (generalized): Secondary | ICD-10-CM | POA: Diagnosis not present

## 2022-11-15 NOTE — Therapy (Signed)
OUTPATIENT PHYSICAL THERAPY LOWER EXTREMITY TREATMENT   Patient Name: Melanie Juarez MRN: [id number] DOB:09-Aug-1945, 77 y.o., female Today's Date: 11/15/2022  END OF SESSION:  PT End of Session - 11/15/22 1346     Visit Number 21    Number of Visits 28    Date for PT Re-Evaluation 12/04/22    PT Start Time 1343    PT Stop Time 1425    PT Time Calculation (min) 42 min    Equipment Utilized During Treatment Gait belt    Activity Tolerance Patient tolerated treatment well    Behavior During Therapy WFL for tasks assessed/performed               Past Medical History:  Diagnosis Date   Diabetes (HCC)    Hypertension    Past Surgical History:  Procedure Laterality Date   AMPUTATION Right 03/22/2022   Procedure: AMPUTATION BELOW KNEE-Guillotine;  Surgeon: Annice Needy, MD;  Location: [facility];  Service: Vascular;  Laterality: Right;   LOWER EXTREMITY ANGIOGRAPHY Right 03/21/2022   Procedure: Lower Extremity Angiography;  Surgeon: Annice Needy, MD;  Location: [facility];  Service: Cardiovascular;  Laterality: Right;   STUMP REVISION Right 03/26/2022   Procedure: BKA REVISION;  Surgeon: Annice Needy, MD;  Location: [facility] OR[facility];  Service: General;  Laterality: Right;   Patient Active Problem List   Diagnosis Date Noted   Hx of BKA, right (HCC) 08/08/2022   Lactic acidosis 03/21/2022   Obesity (BMI 30-39.9) 03/21/2022   PAD (peripheral artery disease) (HCC) 03/21/2022   Osteomyelitis (HCC) 03/19/2022   Diabetic infection of right foot (HCC) 03/19/2022   Essential hypertension 03/19/2022   Leukocytosis 03/19/2022   AKI (acute kidney injury) (HCC) 03/19/2022   Sepsis with acute renal failure without septic shock (HCC) 03/19/2022   Hyperlipidemia 05/17/2014   Type 2 diabetes mellitus (HCC) 05/17/2014   Anemia, unspecified 05/18/2013   Benign essential hypertension 05/18/2013   REFERRING PROVIDER: Annice Needy, MD  REFERRING DIAG: s/p R BKA  THERAPY DIAG:  Hx of BKA,  right (HCC)  Muscle weakness (generalized)  Gait difficulty  Rationale for Evaluation and Treatment: Rehabilitation  ONSET DATE: 03/22/22  SUBJECTIVE:   SUBJECTIVE STATEMENT: Pt. Had a sore on R foot resulting in R BKA on 03/22/22.  Pt. Entered Pt with use of w/c and daughter Melanie Juarez) present.  Pt. Wants to return to living independently.  Pt. Completing HEP from rehab center.  Pt. Has been unable to get into shower.    PERTINENT HISTORY: Melanie Juarez is a 77 y.o. (1945-03-16) female who presents with chief complaint of     Chief Complaint  Patient presents with   Venous Insufficiency  .   History of Present Illness: Patient returns today in follow up of her right BKA.  It is healed.  She is in the process of getting her prosthesis now.  She is doing well.  No pain.  No left leg ulceration or rest pain.    Assessment/Plan   PAD (peripheral artery disease) (HCC) Check left ABI on follow up visit in 3 months.    Type 2 diabetes mellitus (HCC) blood glucose control important in reducing the progression of atherosclerotic disease. Also, involved in wound healing. On appropriate medications.    Essential hypertension blood pressure control important in reducing the progression of atherosclerotic disease. On appropriate oral medications.    Hx of BKA, right (HCC) Healed.  In the process of getting her prosthesis.  A new prescription given for physical therapy for prosthesis and ambulatory training.   [name], MD   08/08/2022 10:42 AM  PAIN:  Are you having pain? No.  Pt. Reports phantom limb symptoms but not pain.    PRECAUTIONS: Fall  RED FLAGS: None   WEIGHT BEARING RESTRICTIONS: No  FALLS:  Has patient fallen in last 6 months? No  LIVING ENVIRONMENT: Lives with: lives with their daughter  Melanie Juarez).   Lives in: House/apartment Stairs: No Has following equipment at home: Dan Humphreys - 2 wheeled, Wheelchair (manual), and Ramped entry  OCCUPATION: Retired  PLOF:  Independent.  Pt. Does not drive.    PATIENT GOALS: Ambulate with least assistive device.  Return home independently/ go back to church.    NEXT MD VISIT: [name] 7/31.  MD f/u in September?  OBJECTIVE:   PATIENT SURVEYS:  FOTO initial 29/ goal 23.    COGNITION: Overall cognitive status: Within functional limits for tasks assessed     SENSATION: WFL  EDEMA:  No R residual limb swelling noted.  PT discussed use of shrinker and proper use of ply socks for improved fit of prosthetic leg.    POSTURE: rounded shoulders and forward head  PALPATION: Minimal tenderness along R distal residual limb/ incision.  Good incision healing/ PT discussed benefits of scar massage.    LOWER EXTREMITY ROM:  Active ROM Right eval Left eval  Hip flexion [facility] WFL  Hip extension NT NT  Hip abduction [facility]  Hip adduction    Hip internal rotation    Hip external rotation    Knee flexion [facility] WFL  Knee extension -3 deg. 0 deg.  Ankle dorsiflexion    Ankle plantarflexion    Ankle inversion    Ankle eversion     (Blank rows = not tested)  LOWER EXTREMITY MMT:  MMT Right eval Left eval  Hip flexion 4/5 4/5  Hip extension    Hip abduction 3+/5 4/5  Hip adduction    Hip internal rotation    Hip external rotation    Knee flexion 4+/5  4+/5  Knee extension 4+/5 5/5  Ankle dorsiflexion    Ankle plantarflexion    Ankle inversion    Ankle eversion     (Blank rows = not tested)   B UE AROM WFL.  B UE strength grossly 4/5 MMT except bicep/tricep 4+/5 MMT  FUNCTIONAL TESTS:  5 times sit to stand: TBD Timed up and go (TUG): TBD 6 minute walk test: TBD  GAIT: Distance walked: 45 feet in gym with RW/ min. A for safety and cuing Assistive device utilized: Enrrique Mierzwa - 2 wheeled Level of assistance: Min A Comments: amb. In gym with use of RW/min A for cuing to increase step length/ heel strike.  Pt. Fearful while walking outside of //-bars.  Pt. Ambs. In //-bars with B UE assist and  increase cadence/ step pattern.  Use of mirror for posture correction/ heel strike (pt. Does not looking at a mirror).     TODAY'S TREATMENT:  DATE: 11/15/2022  Subjective:  Pt. entered PT with use of RW and prosthetic leg donned.  Pt reports no pain this afternoon.   Treatment:   There.ex.:  Nustep level 5 (seat 10) x 8 minutes to improve cardiorespiratory endurance.  Gait training:   Fwd step taps on 6" step 2 x 10 each LE with single UE support   Ascend/descend 2 x 4 steps with recip. Gait pattern and L handrail only.  Good step ups on R LE.    Walking in gym with use of QC only and focus on consistent recip. Gait pattern.  Limited L step length  and challenged with maintaining proper BOS.  Occasional R UE assist by PT to maintain balance.  No LOB.  Ambulate from Nustep to stairs and stairs to // bars    Walking in // bars with B UE assist on bars working on recip. Gait pattern/ upright posture. Pt. Progressing to L UE only and then only quad-cane in LUE: 4 laps - VC's for correct sequencing cane with prosthetic limb, increasing step length on LLE.  Walking in //-bars with alt. Cone taps with L UE support only x 2 laps   Amb. From // bars to car on side of building with use of QC and CGA for safety/ cuing to increase L LE step length and cane sequencing.  HHA provided down small incline for safety.   PATIENT EDUCATION:  Education details: Discussed HEP/ standing hip ex. At [employer] Person educated: Patient and Child(ren) Education method: Medical illustrator Education comprehension: verbalized understanding and returned demonstration  HOME EXERCISE PROGRAM: Access Code: [email or web address] URL: [email or web address] Date: 08/30/2022 Prepared by: Maylon Peppers  Exercises - Sit to Stand with Counter Support  - 1 x daily - 5 x weekly -  3 sets - 10 reps - Standing Hip Abduction with Counter Support  - 1 x daily - 5 x weekly - 3 sets - 10 reps - Standing March with Counter Support  - 1 x daily - 5 x weekly - 3 sets - 10 reps - Standing Hip Extension with Counter Support  - 1 x daily - 5 x weekly - 3 sets - 10 reps  ASSESSMENT:  CLINICAL IMPRESSION:   Patient arrives to PT treatment session motivated to participate with no new complaints. Gait training exercises today consisted of walking with quad cane while emphasizing increased LLE step length and weight shifting to the R side. Ambulation with quad cane trialed outside // bars again today; pt continues to demonstrate step-to pattern consistently with intermittent step through along with more cautious gait pattern when ambulating outside // bars with quad cane.  Pt. Highly motivated to progress to use of cane at home.  Patient will continue to benefit from skilled PT services to increase B LE muscle strength to improve independence/safety with prosthetic/gait training.    OBJECTIVE IMPAIRMENTS: Abnormal gait, decreased activity tolerance, decreased balance, decreased endurance, decreased mobility, difficulty walking, decreased ROM, decreased strength, decreased safety awareness, improper body mechanics, postural dysfunction, and pain.   ACTIVITY LIMITATIONS: carrying, lifting, standing, squatting, stairs, transfers, bed mobility, bathing, toileting, dressing, and locomotion level  PARTICIPATION LIMITATIONS: meal prep, cleaning, laundry, shopping, community activity, and church  PERSONAL FACTORS: Fitness and Past/current experiences are also affecting patient's functional outcome.   REHAB POTENTIAL: Good  CLINICAL DECISION MAKING: Evolving/moderate complexity  EVALUATION COMPLEXITY: Moderate   GOALS: Goals reviewed with patient? Yes  SHORT TERM GOALS: Target date: 09/18/22 Pt. Able to tolerate standing 10  minutes in kitchen with use of prosthetic leg to improve meal  preparation.   Baseline:  limited standing tolerance Goal status: Goal met   LONG TERM GOALS: Target date: 12/04/22  Pt. Will increase FOTO to 46 to improve pain-free mobility.   Baseline: initial FOTO 29 Goal status: GOAL MET 10/09/22 (51)  2.  Pt. Will increase B LE muscle strength 1/2 muscle grade to improve pain-free mobility/ walking.   Baseline:  See above Goal status: Partially met (see above for updated MMT)  3.  Pt. Able to ambulate 200 feet with mod. I with least assistive device to improve return to home.   Baseline: amb. Short distances with RW/ min. A from PT.  Limited endurance Goal status: Partially met (pt able to ambulate ~125 feet continuously with RW 10/09/2022)   PLAN:  PT FREQUENCY: 2x/week  PT DURATION: 6 weeks  PLANNED INTERVENTIONS: Therapeutic exercises, Therapeutic activity, Neuromuscular re-education, Balance training, Gait training, Patient/Family education, Self Care, Joint mobilization, Stair training, Prosthetic training, DME instructions, Manual therapy, and Re-evaluation  PLAN FOR NEXT SESSION: Progress gait training with unilateral UE support as tolerated, progress static/dynamic balance exercises, continue with eccentric quad strengthening, fast-twitch muscle exercises (static repeats with metronome), progress gait training with quad cane as able (emphasize R hip extension and LLE step length)  Maylon Peppers, PT, DPT Physical Therapist - [facility]  11/15/22, 1:46 PM

## 2022-11-20 ENCOUNTER — Encounter: Payer: Self-pay | Admitting: Physical Therapy

## 2022-11-20 ENCOUNTER — Ambulatory Visit: Payer: Medicare HMO

## 2022-11-20 DIAGNOSIS — Z89511 Acquired absence of right leg below knee: Secondary | ICD-10-CM | POA: Diagnosis not present

## 2022-11-20 DIAGNOSIS — R269 Unspecified abnormalities of gait and mobility: Secondary | ICD-10-CM | POA: Diagnosis not present

## 2022-11-20 DIAGNOSIS — M6281 Muscle weakness (generalized): Secondary | ICD-10-CM | POA: Diagnosis not present

## 2022-11-20 NOTE — Therapy (Signed)
OUTPATIENT PHYSICAL THERAPY LOWER EXTREMITY TREATMENT   Patient Name: Melanie Juarez MRN: [id number] DOB:04-Oct-1945, 77 y.o., female Today's Date: 11/20/2022  END OF SESSION:  PT End of Session - 11/20/22 1256     Visit Number 22    Number of Visits 28    Date for PT Re-Evaluation 12/04/22    PT Start Time 1300    PT Stop Time 1343    PT Time Calculation (min) 43 min    Equipment Utilized During Treatment Gait belt    Activity Tolerance Patient tolerated treatment well    Behavior During Therapy WFL for tasks assessed/performed               Past Medical History:  Diagnosis Date   Diabetes (HCC)    Hypertension    Past Surgical History:  Procedure Laterality Date   AMPUTATION Right 03/22/2022   Procedure: AMPUTATION BELOW KNEE-Guillotine;  Surgeon: Annice Needy, MD;  Location: [facility];  Service: Vascular;  Laterality: Right;   LOWER EXTREMITY ANGIOGRAPHY Right 03/21/2022   Procedure: Lower Extremity Angiography;  Surgeon: Annice Needy, MD;  Location: [facility];  Service: Cardiovascular;  Laterality: Right;   STUMP REVISION Right 03/26/2022   Procedure: BKA REVISION;  Surgeon: Annice Needy, MD;  Location: [facility];  Service: General;  Laterality: Right;   Patient Active Problem List   Diagnosis Date Noted   Hx of BKA, right (HCC) 08/08/2022   Lactic acidosis 03/21/2022   Obesity (BMI 30-39.9) 03/21/2022   PAD (peripheral artery disease) (HCC) 03/21/2022   Osteomyelitis (HCC) 03/19/2022   Diabetic infection of right foot (HCC) 03/19/2022   Essential hypertension 03/19/2022   Leukocytosis 03/19/2022   AKI (acute kidney injury) (HCC) 03/19/2022   Sepsis with acute renal failure without septic shock (HCC) 03/19/2022   Hyperlipidemia 05/17/2014   Type 2 diabetes mellitus (HCC) 05/17/2014   Anemia, unspecified 05/18/2013   Benign essential hypertension 05/18/2013   REFERRING PROVIDER: Annice Needy, MD  REFERRING DIAG: s/p R BKA  THERAPY DIAG:  Hx of BKA,  right (HCC)  Gait difficulty  Muscle weakness (generalized)  Rationale for Evaluation and Treatment: Rehabilitation  ONSET DATE: 03/22/22  SUBJECTIVE:   SUBJECTIVE STATEMENT: Pt. Had a sore on R foot resulting in R BKA on 03/22/22.  [name]  Pt. Wants to return to living independently.  Pt. Completing HEP from rehab center.  Pt. Has been unable to get into shower.    PERTINENT HISTORY: SERIYAH TONGA is a 77 y.o. (1945/04/16) female who presents with chief complaint of     Chief Complaint  Patient presents with   Venous Insufficiency  .   History of Present Illness: Patient returns today in follow up of her right BKA.  It is healed.  She is in the process of getting her prosthesis now.  She is doing well.  No pain.  No left leg ulceration or rest pain.    Assessment/Plan   PAD (peripheral artery disease) (HCC) Check left ABI on follow up visit in 3 months.    Type 2 diabetes mellitus (HCC) blood glucose control important in reducing the progression of atherosclerotic disease. Also, involved in wound healing. On appropriate medications.    Essential hypertension blood pressure control important in reducing the progression of atherosclerotic disease. On appropriate oral medications.    Hx of BKA, right (HCC) Healed.  In the process of getting her prosthesis.  A new prescription given for physical therapy for prosthesis and ambulatory training.   [name], MD   08/08/2022 10:42 AM  PAIN:  Are you having pain? No.  Pt. Reports phantom limb symptoms but not pain.    PRECAUTIONS: Fall  RED FLAGS: None   WEIGHT BEARING RESTRICTIONS: No  FALLS:  Has patient fallen in last 6 months? No  LIVING ENVIRONMENT: Lives with: lives with their daughter  [name].   Lives in: House/apartment Stairs: No Has following equipment at home: Dan Humphreys - 2 wheeled, Wheelchair (manual), and Ramped entry  OCCUPATION: Retired  PLOF:  Independent.  Pt. Does not drive.    PATIENT GOALS: Ambulate with least assistive device.  Return home independently/ go back to church.    NEXT MD VISIT: [name] 7/31.  MD f/u in September?  OBJECTIVE:   PATIENT SURVEYS:  FOTO initial 29/ goal 69.    COGNITION: Overall cognitive status: Within functional limits for tasks assessed     SENSATION: WFL  EDEMA:  No R residual limb swelling noted.  PT discussed use of shrinker and proper use of ply socks for improved fit of prosthetic leg.    POSTURE: rounded shoulders and forward head  PALPATION: Minimal tenderness along R distal residual limb/ incision.  Good incision healing/ PT discussed benefits of scar massage.    LOWER EXTREMITY ROM:  Active ROM Right eval Left eval  Hip flexion [facility] WFL  Hip extension NT NT  Hip abduction [facility]  Hip adduction    Hip internal rotation    Hip external rotation    Knee flexion [facility] WFL  Knee extension -3 deg. 0 deg.  Ankle dorsiflexion    Ankle plantarflexion    Ankle inversion    Ankle eversion     (Blank rows = not tested)  LOWER EXTREMITY MMT:  MMT Right eval Left eval  Hip flexion 4/5 4/5  Hip extension    Hip abduction 3+/5 4/5  Hip adduction    Hip internal rotation    Hip external rotation    Knee flexion 4+/5  4+/5  Knee extension 4+/5 5/5  Ankle dorsiflexion    Ankle plantarflexion    Ankle inversion    Ankle eversion     (Blank rows = not tested)   B UE AROM WFL.  B UE strength grossly 4/5 MMT except bicep/tricep 4+/5 MMT  FUNCTIONAL TESTS:  5 times sit to stand: TBD Timed up and go (TUG): TBD 6 minute walk test: TBD  GAIT: Distance walked: 45 feet in gym with RW/ min. A for safety and cuing Assistive device utilized: Nicholous Girgenti - 2 wheeled Level of assistance: Min A Comments: amb. In gym with use of RW/min A for cuing to increase step length/ heel strike.  Pt. Fearful while walking outside of //-bars.  Pt. Ambs. In //-bars with B UE assist and  increase cadence/ step pattern.  Use of mirror for posture correction/ heel strike (pt. Does not looking at a mirror).     TODAY'S TREATMENT:  DATE: 11/20/2022  Subjective:  Pt. entered PT with use of RW and prosthetic leg donned.  Pt reports no pain this afternoon.   Treatment:   There.ex.:  Nustep level 5 (seat 10) x 8 minutes to improve cardiorespiratory endurance.  Gait training:   Walking in gym with use of QC only and focus on consistent recip. Gait pattern.  Limited L step length  and challenged with maintaining proper BOS.  Occasional R UE assist by PT to maintain balance.  No LOB.  Ambulate from Nustep to // bars  then around gym   Walking in // bars with single UE assist on bars working on recip. Gait pattern/ upright posture. Pt. Progressing to L UE only and then only quad-cane in LUE: 4 laps - VC's for correct sequencing cane with prosthetic limb, increasing step length on LLE.  Sidesteps in // bars with single UE assist on bars with cues for upright posture  Step ups onto airex pad with LUE support x 15 leading with each LE   Amb. From // bars to car on side of building with use of QC and CGA for safety/ cuing to increase L LE step length and cane sequencing.  HHA provided down small incline for safety.  Not today: Walking in //-bars with alt. Cone taps with L UE support only x 2 laps  PATIENT EDUCATION:  Education details: Discussed HEP/ standing hip ex. At [employer] Person educated: Patient and Child(ren) Education method: Medical illustrator Education comprehension: verbalized understanding and returned demonstration  HOME EXERCISE PROGRAM: Access Code: [email or web address] URL: [email or web address] Date: 08/30/2022 Prepared by: Maylon Peppers  Exercises - Sit to Stand with Counter Support  - 1 x daily - 5 x weekly - 3  sets - 10 reps - Standing Hip Abduction with Counter Support  - 1 x daily - 5 x weekly - 3 sets - 10 reps - Standing March with Counter Support  - 1 x daily - 5 x weekly - 3 sets - 10 reps - Standing Hip Extension with Counter Support  - 1 x daily - 5 x weekly - 3 sets - 10 reps  ASSESSMENT:  CLINICAL IMPRESSION:    Patient arrives to PT treatment session motivated to participate with no new complaints. Session focused on gait training with quad cane emphasizing on increased LLE strength and weight shifting on R side. Demonstrates increased balance deficits this date but improved with time and practice with quad cane. Motivated to progress to being able to utilize cane at home. Patient will continue to benefit from skilled PT services to increase B LE muscle strength to improve independence/safety with prosthetic/gait training.    OBJECTIVE IMPAIRMENTS: Abnormal gait, decreased activity tolerance, decreased balance, decreased endurance, decreased mobility, difficulty walking, decreased ROM, decreased strength, decreased safety awareness, improper body mechanics, postural dysfunction, and pain.   ACTIVITY LIMITATIONS: carrying, lifting, standing, squatting, stairs, transfers, bed mobility, bathing, toileting, dressing, and locomotion level  PARTICIPATION LIMITATIONS: meal prep, cleaning, laundry, shopping, community activity, and church  PERSONAL FACTORS: Fitness and Past/current experiences are also affecting patient's functional outcome.   REHAB POTENTIAL: Good  CLINICAL DECISION MAKING: Evolving/moderate complexity  EVALUATION COMPLEXITY: Moderate   GOALS: Goals reviewed with patient? Yes  SHORT TERM GOALS: Target date: 09/18/22 Pt. Able to tolerate standing 10 minutes in kitchen with use of prosthetic leg to improve meal preparation.   Baseline:  limited standing tolerance Goal status: Goal met   LONG TERM GOALS: Target date: 12/04/22  Pt. Will  increase FOTO to 46 to improve  pain-free mobility.   Baseline: initial FOTO 29 Goal status: GOAL MET 10/09/22 (51)  2.  Pt. Will increase B LE muscle strength 1/2 muscle grade to improve pain-free mobility/ walking.   Baseline:  See above Goal status: Partially met (see above for updated MMT)  3.  Pt. Able to ambulate 200 feet with mod. I with least assistive device to improve return to home.   Baseline: amb. Short distances with RW/ min. A from PT.  Limited endurance Goal status: Partially met (pt able to ambulate ~125 feet continuously with RW 10/09/2022)   PLAN:  PT FREQUENCY: 2x/week  PT DURATION: 6 weeks  PLANNED INTERVENTIONS: Therapeutic exercises, Therapeutic activity, Neuromuscular re-education, Balance training, Gait training, Patient/Family education, Self Care, Joint mobilization, Stair training, Prosthetic training, DME instructions, Manual therapy, and Re-evaluation  PLAN FOR NEXT SESSION: Progress gait training with unilateral UE support as tolerated, progress static/dynamic balance exercises, continue with eccentric quad strengthening, fast-twitch muscle exercises (static repeats with metronome), progress gait training with quad cane as able (emphasize R hip extension and LLE step length)  Maylon Peppers, PT, DPT Physical Therapist - [facility]  11/20/22, 12:56 PM

## 2022-11-22 ENCOUNTER — Encounter: Payer: Self-pay | Admitting: Physical Therapy

## 2022-11-22 ENCOUNTER — Ambulatory Visit: Payer: Medicare HMO

## 2022-11-22 DIAGNOSIS — M6281 Muscle weakness (generalized): Secondary | ICD-10-CM

## 2022-11-22 DIAGNOSIS — R269 Unspecified abnormalities of gait and mobility: Secondary | ICD-10-CM | POA: Diagnosis not present

## 2022-11-22 DIAGNOSIS — Z89511 Acquired absence of right leg below knee: Secondary | ICD-10-CM | POA: Diagnosis not present

## 2022-11-22 NOTE — Therapy (Signed)
OUTPATIENT PHYSICAL THERAPY LOWER EXTREMITY TREATMENT   Patient Name: Melanie Juarez MRN: [id number] DOB:[birth year], 77 y.o., female Today's Date: 11/22/2022  END OF SESSION:  PT End of Session - 11/22/22 1259     Visit Number 23    Number of Visits 28    Date for PT Re-Evaluation 12/04/22    PT Start Time 1300    PT Stop Time 1344    PT Time Calculation (min) 44 min    Equipment Utilized During Treatment Gait belt    Activity Tolerance Patient tolerated treatment well    Behavior During Therapy WFL for tasks assessed/performed                Past Medical History:  Diagnosis Date   Diabetes (HCC)    Hypertension    Past Surgical History:  Procedure Laterality Date   AMPUTATION Right 03/22/2022   Procedure: AMPUTATION BELOW KNEE-Guillotine;  Surgeon: Annice Needy, MD;  Location: [facility];  Service: Vascular;  Laterality: Right;   LOWER EXTREMITY ANGIOGRAPHY Right 03/21/2022   Procedure: Lower Extremity Angiography;  Surgeon: Annice Needy, MD;  Location: [facility];  Service: Cardiovascular;  Laterality: Right;   STUMP REVISION Right 03/26/2022   Procedure: BKA REVISION;  Surgeon: Annice Needy, MD;  Location: [facility];  Service: General;  Laterality: Right;   Patient Active Problem List   Diagnosis Date Noted   Hx of BKA, right (HCC) 08/08/2022   Lactic acidosis 03/21/2022   Obesity (BMI 30-39.9) 03/21/2022   PAD (peripheral artery disease) (HCC) 03/21/2022   Osteomyelitis (HCC) 03/19/2022   Diabetic infection of right foot (HCC) 03/19/2022   Essential hypertension 03/19/2022   Leukocytosis 03/19/2022   AKI (acute kidney injury) (HCC) 03/19/2022   Sepsis with acute renal failure without septic shock (HCC) 03/19/2022   Hyperlipidemia 05/17/2014   Type 2 diabetes mellitus (HCC) 05/17/2014   Anemia, unspecified 05/18/2013   Benign essential hypertension 05/18/2013   REFERRING PROVIDER: Annice Needy, MD  REFERRING DIAG: s/p R BKA  THERAPY DIAG:  Hx of  BKA, right (HCC)  Muscle weakness (generalized)  Gait difficulty  Rationale for Evaluation and Treatment: Rehabilitation  ONSET DATE: 03/22/22  SUBJECTIVE:   SUBJECTIVE STATEMENT: Pt. Had a sore on R foot resulting in R BKA on 03/22/22.  [name]  Pt. Wants to return to living independently.  Pt. Completing HEP from rehab center.  Pt. Has been unable to get into shower.    PERTINENT HISTORY: Melanie Juarez is a 77 y.o. ([birth year]) female who presents with chief complaint of     Chief Complaint  Patient presents with   Venous Insufficiency  .   History of Present Illness: Patient returns today in follow up of her right BKA.  It is healed.  She is in the process of getting her prosthesis now.  She is doing well.  No pain.  No left leg ulceration or rest pain.    Assessment/Plan   PAD (peripheral artery disease) (HCC) Check left ABI on follow up visit in 3 months.    Type 2 diabetes mellitus (HCC) blood glucose control important in reducing the progression of atherosclerotic disease. Also, involved in wound healing. On appropriate medications.    Essential hypertension blood pressure control important in reducing the progression of atherosclerotic disease. On appropriate oral medications.    Hx of BKA, right (HCC) Healed.  In the process of getting her prosthesis.  A new prescription given for physical therapy for prosthesis and ambulatory training.   [name], MD   08/08/2022 10:42 AM  PAIN:  Are you having pain? No.  Pt. Reports phantom limb symptoms but not pain.    PRECAUTIONS: Fall  RED FLAGS: None   WEIGHT BEARING RESTRICTIONS: No  FALLS:  Has patient fallen in last 6 months? No  LIVING ENVIRONMENT: Lives with: lives with their daughter  [name].   Lives in: House/apartment Stairs: No Has following equipment at home: Dan Humphreys - 2 wheeled, Wheelchair (manual), and Ramped entry  OCCUPATION: Retired  PLOF:  Independent.  Pt. Does not drive.    PATIENT GOALS: Ambulate with least assistive device.  Return home independently/ go back to church.    NEXT MD VISIT: [name] 7/31.  MD f/u in September?  OBJECTIVE:   PATIENT SURVEYS:  FOTO initial 29/ goal 46.    COGNITION: Overall cognitive status: Within functional limits for tasks assessed     SENSATION: WFL  EDEMA:  No R residual limb swelling noted.  PT discussed use of shrinker and proper use of ply socks for improved fit of prosthetic leg.    POSTURE: rounded shoulders and forward head  PALPATION: Minimal tenderness along R distal residual limb/ incision.  Good incision healing/ PT discussed benefits of scar massage.    LOWER EXTREMITY ROM:  Active ROM Right eval Left eval  Hip flexion [facility] WFL  Hip extension NT NT  Hip abduction [facility]  Hip adduction    Hip internal rotation    Hip external rotation    Knee flexion [facility] WFL  Knee extension -3 deg. 0 deg.  Ankle dorsiflexion    Ankle plantarflexion    Ankle inversion    Ankle eversion     (Blank rows = not tested)  LOWER EXTREMITY MMT:  MMT Right eval Left eval  Hip flexion 4/5 4/5  Hip extension    Hip abduction 3+/5 4/5  Hip adduction    Hip internal rotation    Hip external rotation    Knee flexion 4+/5  4+/5  Knee extension 4+/5 5/5  Ankle dorsiflexion    Ankle plantarflexion    Ankle inversion    Ankle eversion     (Blank rows = not tested)   B UE AROM WFL.  B UE strength grossly 4/5 MMT except bicep/tricep 4+/5 MMT  FUNCTIONAL TESTS:  5 times sit to stand: TBD Timed up and go (TUG): TBD 6 minute walk test: TBD  GAIT: Distance walked: 45 feet in gym with RW/ min. A for safety and cuing Assistive device utilized: Amunique Neyra - 2 wheeled Level of assistance: Min A Comments: amb. In gym with use of RW/min A for cuing to increase step length/ heel strike.  Pt. Fearful while walking outside of //-bars.  Pt. Ambs. In //-bars with B UE assist and  increase cadence/ step pattern.  Use of mirror for posture correction/ heel strike (pt. Does not looking at a mirror).     TODAY'S TREATMENT:  DATE: 11/22/2022  Subjective:  Pt. entered PT with use of RW and prosthetic leg donned.  Pt reports no pain this afternoon.   Treatment:   There.ex.:  Nustep level 5 (seat 10) x 8 minutes to improve cardiorespiratory endurance.  Gait training:     Walking in gym with use of QC only and focus on consistent recip. Gait pattern.  Limited L step length  and challenged with maintaining proper BOS.  Occasional R UE assist by PT to maintain balance.  No LOB.  Ambulate from Nustep to // bars then around gym   Walking in // bars with use of QC working on recip. Gait pattern/ upright posture. 5 laps - VC's for correct sequencing cane with prosthetic limb, increasing step length on LLE.  Step ups onto airex pad with LUE support x 15 leading with each LE   Amb. From // bars around gym and out to car on side of building with use of QC and CGA for safety/ cuing to increase L LE step length and cane sequencing.  HHA provided down small incline for safety.  Not today: Walking in //-bars with alt. Cone taps with L UE support only x 2 laps  Sidesteps in // bars with single UE assist on bars with cues for upright posture   PATIENT EDUCATION:  Education details: Discussed HEP/ standing hip ex. At [employer] Person educated: Patient and Child(ren) Education method: Medical illustrator Education comprehension: verbalized understanding and returned demonstration  HOME EXERCISE PROGRAM: Access Code: [email or web address] URL: [email or web address] Date: 08/30/2022 Prepared by: Maylon Peppers  Exercises - Sit to Stand with Counter Support  - 1 x daily - 5 x weekly - 3 sets - 10 reps - Standing Hip Abduction with Counter  Support  - 1 x daily - 5 x weekly - 3 sets - 10 reps - Standing March with Counter Support  - 1 x daily - 5 x weekly - 3 sets - 10 reps - Standing Hip Extension with Counter Support  - 1 x daily - 5 x weekly - 3 sets - 10 reps  ASSESSMENT:  CLINICAL IMPRESSION:     Patient arrives to PT treatment session motivated to participate with no new complaints. Session focused on gait training with quad cane emphasizing on increased LLE strength and weight shifting on R side. Demonstrates improved balance this date but initially unsteady with sequencing with QC. Motivated to progress to being able to utilize cane at home. Patient will continue to benefit from skilled PT services to increase B LE muscle strength to improve independence/safety with prosthetic/gait training.    OBJECTIVE IMPAIRMENTS: Abnormal gait, decreased activity tolerance, decreased balance, decreased endurance, decreased mobility, difficulty walking, decreased ROM, decreased strength, decreased safety awareness, improper body mechanics, postural dysfunction, and pain.   ACTIVITY LIMITATIONS: carrying, lifting, standing, squatting, stairs, transfers, bed mobility, bathing, toileting, dressing, and locomotion level  PARTICIPATION LIMITATIONS: meal prep, cleaning, laundry, shopping, community activity, and church  PERSONAL FACTORS: Fitness and Past/current experiences are also affecting patient's functional outcome.   REHAB POTENTIAL: Good  CLINICAL DECISION MAKING: Evolving/moderate complexity  EVALUATION COMPLEXITY: Moderate   GOALS: Goals reviewed with patient? Yes  SHORT TERM GOALS: Target date: 09/18/22 Pt. Able to tolerate standing 10 minutes in kitchen with use of prosthetic leg to improve meal preparation.   Baseline:  limited standing tolerance Goal status: Goal met   LONG TERM GOALS: Target date: 12/04/22  Pt. Will increase FOTO to 46 to improve pain-free mobility.  Baseline: initial FOTO 29 Goal status: GOAL MET  10/09/22 (51)  2.  Pt. Will increase B LE muscle strength 1/2 muscle grade to improve pain-free mobility/ walking.   Baseline:  See above Goal status: Partially met (see above for updated MMT)  3.  Pt. Able to ambulate 200 feet with mod. I with least assistive device to improve return to home.   Baseline: amb. Short distances with RW/ min. A from PT.  Limited endurance Goal status: Partially met (pt able to ambulate ~125 feet continuously with RW 10/09/2022)   PLAN:  PT FREQUENCY: 2x/week  PT DURATION: 6 weeks  PLANNED INTERVENTIONS: Therapeutic exercises, Therapeutic activity, Neuromuscular re-education, Balance training, Gait training, Patient/Family education, Self Care, Joint mobilization, Stair training, Prosthetic training, DME instructions, Manual therapy, and Re-evaluation  PLAN FOR NEXT SESSION: Progress gait training with unilateral UE support as tolerated, progress static/dynamic balance exercises, continue with eccentric quad strengthening, fast-twitch muscle exercises (static repeats with metronome), progress gait training with quad cane as able (emphasize R hip extension and LLE step length)  Maylon Peppers, PT, DPT Physical Therapist - [facility]  11/22/22, 2:42 PM

## 2022-11-26 DIAGNOSIS — E113591 Type 2 diabetes mellitus with proliferative diabetic retinopathy without macular edema, right eye: Secondary | ICD-10-CM | POA: Diagnosis not present

## 2022-11-27 ENCOUNTER — Ambulatory Visit: Payer: Medicare HMO | Admitting: Physical Therapy

## 2022-11-29 ENCOUNTER — Ambulatory Visit: Payer: Medicare HMO | Admitting: Physical Therapy

## 2022-11-29 ENCOUNTER — Encounter: Payer: Self-pay | Admitting: Physical Therapy

## 2022-11-29 DIAGNOSIS — Z89511 Acquired absence of right leg below knee: Secondary | ICD-10-CM

## 2022-11-29 DIAGNOSIS — R269 Unspecified abnormalities of gait and mobility: Secondary | ICD-10-CM

## 2022-11-29 DIAGNOSIS — M6281 Muscle weakness (generalized): Secondary | ICD-10-CM | POA: Diagnosis not present

## 2022-11-29 NOTE — Therapy (Signed)
OUTPATIENT PHYSICAL THERAPY LOWER EXTREMITY TREATMENT   Patient Name: GRAYLYN HIEBER MRN: [id number] DOB:[birth year], 77 y.o., female Today's Date: 11/29/2022  END OF SESSION:  PT End of Session - 11/29/22 1224     Visit Number 24    Number of Visits 28    Date for PT Re-Evaluation 12/04/22    PT Start Time 1201    PT Stop Time 1247    PT Time Calculation (min) 46 min    Equipment Utilized During Treatment Gait belt    Activity Tolerance Patient tolerated treatment well    Behavior During Therapy WFL for tasks assessed/performed                 Past Medical History:  Diagnosis Date   Diabetes (HCC)    Hypertension    Past Surgical History:  Procedure Laterality Date   AMPUTATION Right 03/22/2022   Procedure: AMPUTATION BELOW KNEE-Guillotine;  Surgeon: Annice Needy, MD;  Location: [facility] ORS;  Service: Vascular;  Laterality: Right;   LOWER EXTREMITY ANGIOGRAPHY Right 03/21/2022   Procedure: Lower Extremity Angiography;  Surgeon: Annice Needy, MD;  Location: [facility];  Service: Cardiovascular;  Laterality: Right;   STUMP REVISION Right 03/26/2022   Procedure: BKA REVISION;  Surgeon: Annice Needy, MD;  Location: [facility] ORS;  Service: General;  Laterality: Right;   Patient Active Problem List   Diagnosis Date Noted   Hx of BKA, right (HCC) 08/08/2022   Lactic acidosis 03/21/2022   Obesity (BMI 30-39.9) 03/21/2022   PAD (peripheral artery disease) (HCC) 03/21/2022   Osteomyelitis (HCC) 03/19/2022   Diabetic infection of right foot (HCC) 03/19/2022   Essential hypertension 03/19/2022   Leukocytosis 03/19/2022   AKI (acute kidney injury) (HCC) 03/19/2022   Sepsis with acute renal failure without septic shock (HCC) 03/19/2022   Hyperlipidemia 05/17/2014   Type 2 diabetes mellitus (HCC) 05/17/2014   Anemia, unspecified 05/18/2013   Benign essential hypertension 05/18/2013   REFERRING PROVIDER: Annice Needy, MD  REFERRING DIAG: s/p R BKA  THERAPY DIAG:  Hx of  BKA, right (HCC)  Muscle weakness (generalized)  Gait difficulty  Rationale for Evaluation and Treatment: Rehabilitation  ONSET DATE: 03/22/22  SUBJECTIVE:   SUBJECTIVE STATEMENT: Pt. Had a sore on R foot resulting in R BKA on 03/22/22.  [name]  Pt. Wants to return to living independently.  Pt. Completing HEP from rehab center.  Pt. Has been unable to get into shower.    PERTINENT HISTORY: LEAFIE NADON is a 77 y.o. ([birth year]) female who presents with chief complaint of     Chief Complaint  Patient presents with   Venous Insufficiency  .   History of Present Illness: Patient returns today in follow up of her right BKA.  It is healed.  She is in the process of getting her prosthesis now.  She is doing well.  No pain.  No left leg ulceration or rest pain.    Assessment/Plan   PAD (peripheral artery disease) (HCC) Check left ABI on follow up visit in 3 months.    Type 2 diabetes mellitus (HCC) blood glucose control important in reducing the progression of atherosclerotic disease. Also, involved in wound healing. On appropriate medications.    Essential hypertension blood pressure control important in reducing the progression of atherosclerotic disease. On appropriate oral medications.    Hx of BKA, right (HCC) Healed.  In the process of getting her  prosthesis.  A new prescription given for physical therapy for prosthesis and ambulatory training.   [name], MD   08/08/2022 10:42 AM  PAIN:  Are you having pain? No.  Pt. Reports phantom limb symptoms but not pain.    PRECAUTIONS: Fall  RED FLAGS: None   WEIGHT BEARING RESTRICTIONS: No  FALLS:  Has patient fallen in last 6 months? No  LIVING ENVIRONMENT: Lives with: lives with their daughter  [name].   Lives in: House/apartment Stairs: No Has following equipment at home: Dan Humphreys - 2 wheeled, Wheelchair (manual), and Ramped entry  OCCUPATION: Retired  PLOF:  Independent.  Pt. Does not drive.    PATIENT GOALS: Ambulate with least assistive device.  Return home independently/ go back to church.    NEXT MD VISIT: [name] 7/31.  MD f/u in September?  OBJECTIVE:   PATIENT SURVEYS:  FOTO initial 29/ goal 52.    COGNITION: Overall cognitive status: Within functional limits for tasks assessed     SENSATION: WFL  EDEMA:  No R residual limb swelling noted.  PT discussed use of shrinker and proper use of ply socks for improved fit of prosthetic leg.    POSTURE: rounded shoulders and forward head  PALPATION: Minimal tenderness along R distal residual limb/ incision.  Good incision healing/ PT discussed benefits of scar massage.    LOWER EXTREMITY ROM:  Active ROM Right eval Left eval  Hip flexion [facility] WFL  Hip extension NT NT  Hip abduction [facility]  Hip adduction    Hip internal rotation    Hip external rotation    Knee flexion [facility] WFL  Knee extension -3 deg. 0 deg.  Ankle dorsiflexion    Ankle plantarflexion    Ankle inversion    Ankle eversion     (Blank rows = not tested)  LOWER EXTREMITY MMT:  MMT Right eval Left eval  Hip flexion 4/5 4/5  Hip extension    Hip abduction 3+/5 4/5  Hip adduction    Hip internal rotation    Hip external rotation    Knee flexion 4+/5  4+/5  Knee extension 4+/5 5/5  Ankle dorsiflexion    Ankle plantarflexion    Ankle inversion    Ankle eversion     (Blank rows = not tested)   B UE AROM WFL.  B UE strength grossly 4/5 MMT except bicep/tricep 4+/5 MMT  FUNCTIONAL TESTS:  5 times sit to stand: TBD Timed up and go (TUG): TBD 6 minute walk test: TBD  GAIT: Distance walked: 45 feet in gym with RW/ min. A for safety and cuing Assistive device utilized: Walker - 2 wheeled Level of assistance: Min A Comments: amb. In gym with use of RW/min A for cuing to increase step length/ heel strike.  Pt. Fearful while walking outside of //-bars.  Pt. Ambs. In //-bars with B UE assist and  increase cadence/ step pattern.  Use of mirror for posture correction/ heel strike (pt. Does not looking at a mirror).     TODAY'S TREATMENT:  DATE: 11/29/2022  Subjective:  Pt. entered PT with use of RW and prosthetic leg donned.  She says her prosthetic leg has been stuck and she has not been able to remove it for about 24 hours; she had to sleep with it on last night.  PT was able to remove prosthetic leg and assess the skin.  No pain other than a little soreness/tenderness in her residual limb from the prosthetic being on for so long.  Treatment:  Residual limb assessed secondary to pt being unable to get her limb off last night; one of her socks had gotten stuck in the pin-lock but PTs were able to remove it with no complications; pt's residual limb was mildly red and tender but no significant swelling or warmth noted  There.ex.:  Nustep level 4 (seat 10) x 8 minutes to improve cardiorespiratory endurance.  Gait training:  Walking in gym with use of QC only and focus on consistent recip. Gait pattern.  Limited L step length  and challenged with maintaining proper BOS, 1 lap around gym  Walking in // bars with use of QC fwd/backwards working on recip. Gait pattern/ upright posture. 4 laps - VC's for correct sequencing cane with prosthetic limb, increasing step length on LLE.  Alternating 6-inch step taps with QC in // bars: 2x1 minute bouts - VC's for only using QC for support  Amb. From // bars out to car on side of building with use of QC and CGA for safety/ cuing to increase L LE step length and cane sequencing.   Not today: Walking in //-bars with alt. Cone taps with L UE support only x 2 laps  Sidesteps in // bars with single UE assist on bars with cues for upright posture   PATIENT EDUCATION:  Education details: Discussed HEP/ standing hip ex. At [employer] Person educated: Patient and Child(ren) Education method: Medical illustrator Education comprehension: verbalized understanding and returned demonstration  HOME EXERCISE PROGRAM: Access Code: [email or web address] URL: [email or web address] Date: 08/30/2022 Prepared by: Maylon Peppers  Exercises - Sit to Stand with Counter Support  - 1 x daily - 5 x weekly - 3 sets - 10 reps - Standing Hip Abduction with Counter Support  - 1 x daily - 5 x weekly - 3 sets - 10 reps - Standing March with Counter Support  - 1 x daily - 5 x weekly - 3 sets - 10 reps - Standing Hip Extension with Counter Support  - 1 x daily - 5 x weekly - 3 sets - 10 reps  ASSESSMENT:  CLINICAL IMPRESSION:     Patient arrives to PT treatment session with prosthetic limb donned and using RW. Pt reported her prosthetic limb being stuck and being unable to remove it all yesterday, so session started with removal of prosthetic and assessing her residual limb. The inner sock had gotten stuck on the pin-lock mechanism but was able to be removed with no complications. Pt's residual limb was mildly red along the bottom contact point and tender, however no significant edema or skin breakdown noted. The rest of the session consisted of gait training with quad cane both in and outside the // bars. Gait training continues to place an emphasis on symmetrical step length and increasing prosthetic trust, particularly when outside of the // bars. Floor ladder used for part of pt's ambulation practice to give her a visual cue for symmetrical step length. Patient will continue to benefit from skilled PT services to increase B LE  muscle strength to improve independence/safety with prosthetic/gait training.    OBJECTIVE IMPAIRMENTS: Abnormal gait, decreased activity tolerance, decreased balance, decreased endurance, decreased mobility, difficulty walking, decreased ROM, decreased strength, decreased safety awareness, improper body  mechanics, postural dysfunction, and pain.   ACTIVITY LIMITATIONS: carrying, lifting, standing, squatting, stairs, transfers, bed mobility, bathing, toileting, dressing, and locomotion level  PARTICIPATION LIMITATIONS: meal prep, cleaning, laundry, shopping, community activity, and church  PERSONAL FACTORS: Fitness and Past/current experiences are also affecting patient's functional outcome.   REHAB POTENTIAL: Good  CLINICAL DECISION MAKING: Evolving/moderate complexity  EVALUATION COMPLEXITY: Moderate   GOALS: Goals reviewed with patient? Yes  SHORT TERM GOALS: Target date: 09/18/22 Pt. Able to tolerate standing 10 minutes in kitchen with use of prosthetic leg to improve meal preparation.   Baseline:  limited standing tolerance Goal status: Goal met   LONG TERM GOALS: Target date: 12/04/22  Pt. Will increase FOTO to 46 to improve pain-free mobility.   Baseline: initial FOTO 29 Goal status: GOAL MET 10/09/22 (51)  2.  Pt. Will increase B LE muscle strength 1/2 muscle grade to improve pain-free mobility/ walking.   Baseline:  See above Goal status: Partially met (see above for updated MMT)  3.  Pt. Able to ambulate 200 feet with mod. I with least assistive device to improve return to home.   Baseline: amb. Short distances with RW/ min. A from PT.  Limited endurance Goal status: Partially met (pt able to ambulate ~125 feet continuously with RW 10/09/2022)   PLAN:  PT FREQUENCY: 2x/week  PT DURATION: 6 weeks  PLANNED INTERVENTIONS: Therapeutic exercises, Therapeutic activity, Neuromuscular re-education, Balance training, Gait training, Patient/Family education, Self Care, Joint mobilization, Stair training, Prosthetic training, DME instructions, Manual therapy, and Re-evaluation  PLAN FOR NEXT SESSION: Progress gait training with unilateral UE support as tolerated, progress static/dynamic balance exercises, continue with eccentric quad strengthening, fast-twitch muscle  exercises (static repeats with metronome), progress gait training with quad cane as able (emphasize R hip extension and LLE step length), CHECK LTGs  Cammie Mcgee, PT, DPT # [phone] Cena Benton, SPT 11/29/22, 1:49 PM

## 2022-12-04 ENCOUNTER — Ambulatory Visit: Payer: Medicare HMO

## 2022-12-04 ENCOUNTER — Encounter: Payer: Self-pay | Admitting: Physical Therapy

## 2022-12-04 DIAGNOSIS — Z89511 Acquired absence of right leg below knee: Secondary | ICD-10-CM | POA: Diagnosis not present

## 2022-12-04 DIAGNOSIS — R269 Unspecified abnormalities of gait and mobility: Secondary | ICD-10-CM

## 2022-12-04 DIAGNOSIS — M6281 Muscle weakness (generalized): Secondary | ICD-10-CM

## 2022-12-04 NOTE — Therapy (Signed)
OUTPATIENT PHYSICAL THERAPY LOWER EXTREMITY TREATMENT/RECERTIFICATION   Patient Name: Melanie Juarez MRN: [id number] DOB:[birth year], 77 y.o., female Today's Date: 12/04/2022  END OF SESSION:  PT End of Session - 12/04/22 1258     Visit Number 25    Number of Visits 35    Date for PT Re-Evaluation 12/27/22    PT Start Time 1300    PT Stop Time 1347    PT Time Calculation (min) 47 min    Equipment Utilized During Treatment Gait belt    Activity Tolerance Patient tolerated treatment well    Behavior During Therapy WFL for tasks assessed/performed                 Past Medical History:  Diagnosis Date   Diabetes (HCC)    Hypertension    Past Surgical History:  Procedure Laterality Date   AMPUTATION Right 03/22/2022   Procedure: AMPUTATION BELOW KNEE-Guillotine;  Surgeon: Annice Needy, MD;  Location: [facility] ORS;  Service: Vascular;  Laterality: Right;   LOWER EXTREMITY ANGIOGRAPHY Right 03/21/2022   Procedure: Lower Extremity Angiography;  Surgeon: Annice Needy, MD;  Location: [facility] INV[facility];  Service: Cardiovascular;  Laterality: Right;   STUMP REVISION Right 03/26/2022   Procedure: BKA REVISION;  Surgeon: Annice Needy, MD;  Location: [facility] ORS;  Service: General;  Laterality: Right;   Patient Active Problem List   Diagnosis Date Noted   Hx of BKA, right (HCC) 08/08/2022   Lactic acidosis 03/21/2022   Obesity (BMI 30-39.9) 03/21/2022   PAD (peripheral artery disease) (HCC) 03/21/2022   Osteomyelitis (HCC) 03/19/2022   Diabetic infection of right foot (HCC) 03/19/2022   Essential hypertension 03/19/2022   Leukocytosis 03/19/2022   AKI (acute kidney injury) (HCC) 03/19/2022   Sepsis with acute renal failure without septic shock (HCC) 03/19/2022   Hyperlipidemia 05/17/2014   Type 2 diabetes mellitus (HCC) 05/17/2014   Anemia, unspecified 05/18/2013   Benign essential hypertension 05/18/2013   REFERRING PROVIDER: Annice Needy, MD  REFERRING DIAG: s/p R  BKA  THERAPY DIAG:  Hx of BKA, right (HCC)  Muscle weakness (generalized)  Gait difficulty  Rationale for Evaluation and Treatment: Rehabilitation  ONSET DATE: 03/22/22  SUBJECTIVE:   SUBJECTIVE STATEMENT: Pt. Had a sore on R foot resulting in R BKA on 03/22/22.  [name]  Pt. Wants to return to living independently.  Pt. Completing HEP from rehab center.  Pt. Has been unable to get into shower.    PERTINENT HISTORY: ABIOLA KUEHNLE is a 77 y.o. (Oct 13, 1945) female who presents with chief complaint of     Chief Complaint  Patient presents with   Venous Insufficiency  .   History of Present Illness: Patient returns today in follow up of her right BKA.  It is healed.  She is in the process of getting her prosthesis now.  She is doing well.  No pain.  No left leg ulceration or rest pain.    Assessment/Plan   PAD (peripheral artery disease) (HCC) Check left ABI on follow up visit in 3 months.    Type 2 diabetes mellitus (HCC) blood glucose control important in reducing the progression of atherosclerotic disease. Also, involved in wound healing. On appropriate medications.    Essential hypertension blood pressure control important in reducing the progression of atherosclerotic disease. On appropriate oral medications.    Hx of BKA, right (HCC) Healed.  In the process of getting her  prosthesis.  A new prescription given for physical therapy for prosthesis and ambulatory training.   [name], MD   08/08/2022 10:42 AM  PAIN:  Are you having pain? No.  Pt. Reports phantom limb symptoms but not pain.    PRECAUTIONS: Fall  RED FLAGS: None   WEIGHT BEARING RESTRICTIONS: No  FALLS:  Has patient fallen in last 6 months? No  LIVING ENVIRONMENT: Lives with: lives with their daughter  [name].   Lives in: House/apartment Stairs: No Has following equipment at home: Dan Humphreys - 2 wheeled, Wheelchair (manual), and Ramped  entry  OCCUPATION: Retired  PLOF: Independent.  Pt. Does not drive.    PATIENT GOALS: Ambulate with least assistive device.  Return home independently/ go back to church.    NEXT MD VISIT: [name] 7/31.  MD f/u in September?  OBJECTIVE:   PATIENT SURVEYS:  FOTO initial 29/ goal 55.    COGNITION: Overall cognitive status: Within functional limits for tasks assessed     SENSATION: WFL  EDEMA:  No R residual limb swelling noted.  PT discussed use of shrinker and proper use of ply socks for improved fit of prosthetic leg.    POSTURE: rounded shoulders and forward head  PALPATION: Minimal tenderness along R distal residual limb/ incision.  Good incision healing/ PT discussed benefits of scar massage.    LOWER EXTREMITY ROM:  Active ROM Right eval Left eval  Hip flexion [facility] WFL  Hip extension NT NT  Hip abduction [facility]  Hip adduction    Hip internal rotation    Hip external rotation    Knee flexion [facility] WFL  Knee extension -3 deg. 0 deg.  Ankle dorsiflexion    Ankle plantarflexion    Ankle inversion    Ankle eversion     (Blank rows = not tested)  LOWER EXTREMITY MMT:  MMT Right eval Left eval  Hip flexion 4/5 4/5  Hip extension    Hip abduction 3+/5 4/5  Hip adduction    Hip internal rotation    Hip external rotation    Knee flexion 4+/5  4+/5  Knee extension 4+/5 5/5  Ankle dorsiflexion    Ankle plantarflexion    Ankle inversion    Ankle eversion     (Blank rows = not tested)   B UE AROM WFL.  B UE strength grossly 4/5 MMT except bicep/tricep 4+/5 MMT  FUNCTIONAL TESTS:  5 times sit to stand: TBD Timed up and go (TUG): TBD 6 minute walk test: TBD  GAIT: Distance walked: 45 feet in gym with RW/ min. A for safety and cuing Assistive device utilized: Walker - 2 wheeled Level of assistance: Min A Comments: amb. In gym with use of RW/min A for cuing to increase step length/ heel strike.  Pt. Fearful while walking outside of //-bars.  Pt. Ambs.  In //-bars with B UE assist and increase cadence/ step pattern.  Use of mirror for posture correction/ heel strike (pt. Does not looking at a mirror).     TODAY'S TREATMENT:  DATE: 12/04/2022  Subjective:  Pt. entered PT with use of RW and prosthetic leg donned.  She reports no pain in her residual limb and no other issues donning/doffing the prosthetic since last week. She says she has still been walking around at home and it has been going well.  Treatment:   There.ex.:  Nustep level 4 (seat 10) x 8 minutes to improve cardiorespiratory endurance.  FOTO: 51  LOWER EXTREMITY MMT:    MMT Right eval Left eval  Hip flexion 4+ 4+  Hip extension    Hip abduction 4 4  Hip adduction    Hip internal rotation    Hip external rotation    Knee flexion 4+ 4+  Knee extension 5 5   Ankle dorsiflexion    Ankle plantarflexion    Ankle inversion    Ankle eversion     (Blank rows = not tested)   with RW: 424 feet, 1/10 exertion rating  with Quad-Cane:  140 feet, 6/10 exertion rating      Not today: Walking in gym with use of QC only and focus on consistent recip. Gait pattern.  Limited L step length  and challenged with maintaining proper BOS, 1 lap around gym  Walking in // bars with use of QC fwd/backwards working on recip. Gait pattern/ upright posture. 4 laps - VC's for correct sequencing cane with prosthetic limb, increasing step length on LLE.  Alternating 6-inch step taps with QC in // bars: 2x1 minute bouts - VC's for only using QC for support  Amb. From // bars out to car on side of building with use of QC and CGA for safety/ cuing to increase L LE step length and cane sequencing.  Walking in //-bars with alt. Cone taps with L UE support only x 2 laps  Sidesteps in // bars with single UE assist on bars with cues for upright  posture   PATIENT EDUCATION:  Education details: Discussed HEP/ standing hip ex. At [employer] Person educated: Patient and Child(ren) Education method: Medical illustrator Education comprehension: verbalized understanding and returned demonstration  HOME EXERCISE PROGRAM: Access Code: [email or web address] URL: [email or web address] Date: 08/30/2022 Prepared by: Maylon Peppers  Exercises - Sit to Stand with Counter Support  - 1 x daily - 5 x weekly - 3 sets - 10 reps - Standing Hip Abduction with Counter Support  - 1 x daily - 5 x weekly - 3 sets - 10 reps - Standing March with Counter Support  - 1 x daily - 5 x weekly - 3 sets - 10 reps - Standing Hip Extension with Counter Support  - 1 x daily - 5 x weekly - 3 sets - 10 reps  ASSESSMENT:  CLINICAL IMPRESSION:     Patient arrives to PT treatment session with prosthetic limb donned and using RW. Reassessment of goals performed today. Pt demonstrated mild improvement in RLE MMT scores compared to initial evaluation. performed twice today, once with pt using a RW and once with pt using a quad-cane. Pt's distance was significantly decreased when using the quad-cane, as well as her gait speed. Pt is visibly less confident when ambulating with a quad cane and has decreased step length with the LLE secondary to decreased prosthetic trust. LTG added to include pt's distance when using a quad cane. Patient's condition has the potential to improve in response to therapy. Maximum improvement is yet to be obtained. The anticipated improvement is attainable and reasonable in a generally  predictable time. Pt will continue to benefit from skilled PT services to improve her ambulation and function with using a less restrictive assistive device (quad cane) and return her to her PLOF.  OBJECTIVE IMPAIRMENTS: Abnormal gait, decreased activity tolerance, decreased balance, decreased endurance, decreased mobility, difficulty walking,  decreased ROM, decreased strength, decreased safety awareness, improper body mechanics, postural dysfunction, and pain.   ACTIVITY LIMITATIONS: carrying, lifting, standing, squatting, stairs, transfers, bed mobility, bathing, toileting, dressing, and locomotion level  PARTICIPATION LIMITATIONS: meal prep, cleaning, laundry, shopping, community activity, and church  PERSONAL FACTORS: Fitness and Past/current experiences are also affecting patient's functional outcome.   REHAB POTENTIAL: Good  CLINICAL DECISION MAKING: Evolving/moderate complexity  EVALUATION COMPLEXITY: Moderate   GOALS: Goals reviewed with patient? Yes  SHORT TERM GOALS: Target date: 09/18/22 Pt. Able to tolerate standing 10 minutes in kitchen with use of prosthetic leg to improve meal preparation.   Baseline:  limited standing tolerance Goal status: Goal met   LONG TERM GOALS: Target date: 12/04/22  Pt. Will increase FOTO to 46 to improve pain-free mobility.   Baseline: initial FOTO 29 Goal status: GOAL MET 10/09/22 (51)  2.  Pt. Will increase B LE muscle strength 1/2 muscle grade to improve pain-free mobility/ walking.   Baseline:  See above, 10/22: see above for updated scores Goal status: Partially met (see above for updated MMT)  3.  Pt. Able to ambulate 200 feet with mod. I with least assistive device to improve return to home.   Baseline: amb. Short distances with RW/ min. A from PT.  Limited endurance; 10/22: 424 feet with RW during Goal status: GOAL MET (10/22) 4. Pt will increase by at least 35m (168ft) when using LRAD (quad cane) in order to demonstrate clinically significant improvement in cardiopulmonary endurance and community ambulation  Baseline: 140 feet with quad cane  Goal status: initial    PLAN:  PT FREQUENCY: 2x/week  PT DURATION: 6 weeks  PLANNED INTERVENTIONS: Therapeutic exercises, Therapeutic activity, Neuromuscular re-education, Balance training, Gait training,  Patient/Family education, Self Care, Joint mobilization, Stair training, Prosthetic training, DME instructions, Manual therapy, and Re-evaluation  PLAN FOR NEXT SESSION: Progress gait training with unilateral UE support as tolerated, progress static/dynamic balance exercises, continue with eccentric quad strengthening, fast-twitch muscle exercises (static repeats with metronome), progress gait training with quad cane as able (emphasize R hip extension and LLE step length)  Glenda Spelman, SPT  Maylon Peppers, PT, DPT Physical Therapist - [facility] 12/04/22, 2:03 PM

## 2022-12-06 ENCOUNTER — Ambulatory Visit: Payer: Medicare HMO

## 2022-12-06 ENCOUNTER — Encounter: Payer: Self-pay | Admitting: Physical Therapy

## 2022-12-06 DIAGNOSIS — Z89511 Acquired absence of right leg below knee: Secondary | ICD-10-CM | POA: Diagnosis not present

## 2022-12-06 DIAGNOSIS — M6281 Muscle weakness (generalized): Secondary | ICD-10-CM

## 2022-12-06 DIAGNOSIS — R269 Unspecified abnormalities of gait and mobility: Secondary | ICD-10-CM | POA: Diagnosis not present

## 2022-12-06 NOTE — Therapy (Signed)
OUTPATIENT PHYSICAL THERAPY LOWER EXTREMITY TREATMENT   Patient Name: Melanie Juarez MRN: [id number] DOB:November 07, 1945, 77 y.o., female Today's Date: 12/06/2022  END OF SESSION:  PT End of Session - 12/06/22 1306     Visit Number 26    Number of Visits 35    Date for PT Re-Evaluation 12/27/22    PT Start Time 1300    PT Stop Time 1348    PT Time Calculation (min) 48 min    Equipment Utilized During Treatment Gait belt    Activity Tolerance Patient tolerated treatment well    Behavior During Therapy WFL for tasks assessed/performed                  Past Medical History:  Diagnosis Date   Diabetes (HCC)    Hypertension    Past Surgical History:  Procedure Laterality Date   AMPUTATION Right 03/22/2022   Procedure: AMPUTATION BELOW KNEE-Guillotine;  Surgeon: Annice Needy, MD;  Location: [facility];  Service: Vascular;  Laterality: Right;   LOWER EXTREMITY ANGIOGRAPHY Right 03/21/2022   Procedure: Lower Extremity Angiography;  Surgeon: Annice Needy, MD;  Location: [facility];  Service: Cardiovascular;  Laterality: Right;   STUMP REVISION Right 03/26/2022   Procedure: BKA REVISION;  Surgeon: Annice Needy, MD;  Location: [facility];  Service: General;  Laterality: Right;   Patient Active Problem List   Diagnosis Date Noted   Hx of BKA, right (HCC) 08/08/2022   Lactic acidosis 03/21/2022   Obesity (BMI 30-39.9) 03/21/2022   PAD (peripheral artery disease) (HCC) 03/21/2022   Osteomyelitis (HCC) 03/19/2022   Diabetic infection of right foot (HCC) 03/19/2022   Essential hypertension 03/19/2022   Leukocytosis 03/19/2022   AKI (acute kidney injury) (HCC) 03/19/2022   Sepsis with acute renal failure without septic shock (HCC) 03/19/2022   Hyperlipidemia 05/17/2014   Type 2 diabetes mellitus (HCC) 05/17/2014   Anemia, unspecified 05/18/2013   Benign essential hypertension 05/18/2013   REFERRING PROVIDER: Annice Needy, MD  REFERRING DIAG: s/p R BKA  THERAPY DIAG:  Hx  of BKA, right (HCC)  Muscle weakness (generalized)  Rationale for Evaluation and Treatment: Rehabilitation  ONSET DATE: 03/22/22  SUBJECTIVE:   SUBJECTIVE STATEMENT: Pt. Had a sore on R foot resulting in R BKA on 03/22/22.  [name]  Pt. Wants to return to living independently.  Pt. Completing HEP from rehab center.  Pt. Has been unable to get into shower.    PERTINENT HISTORY: Melanie Juarez is a 77 y.o. (1945-10-30) female who presents with chief complaint of     Chief Complaint  Patient presents with   Venous Insufficiency  .   History of Present Illness: Patient returns today in follow up of her right BKA.  It is healed.  She is in the process of getting her prosthesis now.  She is doing well.  No pain.  No left leg ulceration or rest pain.    Assessment/Plan   PAD (peripheral artery disease) (HCC) Check left ABI on follow up visit in 3 months.    Type 2 diabetes mellitus (HCC) blood glucose control important in reducing the progression of atherosclerotic disease. Also, involved in wound healing. On appropriate medications.    Essential hypertension blood pressure control important in reducing the progression of atherosclerotic disease. On appropriate oral medications.    Hx of BKA, right (HCC) Healed.  In the process of getting her prosthesis.  A new prescription given for physical therapy for prosthesis and ambulatory training.   [name], MD   08/08/2022 10:42 AM  PAIN:  Are you having pain? No.  Pt. Reports phantom limb symptoms but not pain.    PRECAUTIONS: Fall  RED FLAGS: None   WEIGHT BEARING RESTRICTIONS: No  FALLS:  Has patient fallen in last 6 months? No  LIVING ENVIRONMENT: Lives with: lives with their daughter  [name].   Lives in: House/apartment Stairs: No Has following equipment at home: Dan Humphreys - 2 wheeled, Wheelchair (manual), and Ramped entry  OCCUPATION: Retired  PLOF: Independent.   Pt. Does not drive.    PATIENT GOALS: Ambulate with least assistive device.  Return home independently/ go back to church.    NEXT MD VISIT: [name] 7/31.  MD f/u in September?  OBJECTIVE:   PATIENT SURVEYS:  FOTO initial 29/ goal 50.    COGNITION: Overall cognitive status: Within functional limits for tasks assessed     SENSATION: WFL  EDEMA:  No R residual limb swelling noted.  PT discussed use of shrinker and proper use of ply socks for improved fit of prosthetic leg.    POSTURE: rounded shoulders and forward head  PALPATION: Minimal tenderness along R distal residual limb/ incision.  Good incision healing/ PT discussed benefits of scar massage.    LOWER EXTREMITY ROM:  Active ROM Right eval Left eval  Hip flexion [facility] WFL  Hip extension NT NT  Hip abduction [facility]  Hip adduction    Hip internal rotation    Hip external rotation    Knee flexion [facility] WFL  Knee extension -3 deg. 0 deg.  Ankle dorsiflexion    Ankle plantarflexion    Ankle inversion    Ankle eversion     (Blank rows = not tested)  LOWER EXTREMITY MMT:  MMT Right eval Left eval  Hip flexion 4/5 4/5  Hip extension    Hip abduction 3+/5 4/5  Hip adduction    Hip internal rotation    Hip external rotation    Knee flexion 4+/5  4+/5  Knee extension 4+/5 5/5  Ankle dorsiflexion    Ankle plantarflexion    Ankle inversion    Ankle eversion     (Blank rows = not tested)   B UE AROM WFL.  B UE strength grossly 4/5 MMT except bicep/tricep 4+/5 MMT  FUNCTIONAL TESTS:  5 times sit to stand: TBD Timed up and go (TUG): TBD 6 minute walk test: TBD  GAIT: Distance walked: 45 feet in gym with RW/ min. A for safety and cuing Assistive device utilized: Walker - 2 wheeled Level of assistance: Min A Comments: amb. In gym with use of RW/min A for cuing to increase step length/ heel strike.  Pt. Fearful while walking outside of //-bars.  Pt. Ambs. In //-bars with B UE assist and increase cadence/  step pattern.  Use of mirror for posture correction/ heel strike (pt. Does not looking at a mirror).     TODAY'S TREATMENT:  DATE: 12/06/2022  Subjective:  Pt. entered PT with use of RW and prosthetic leg donned.  She reports no pain in her residual limb. No significant changes since last session.  Treatment:   There.ex.:  Nustep level 4 (seat 10) x 10 minutes to improve cardiorespiratory endurance.  Forward Step-ups onto 6 inch step: 1x8 each leg, single UE support   Gait Training: Walking in gym with use of QC only and focus on consistent recip. Gait pattern.  Limited L step length  and challenged with maintaining proper BOS, 1 lap around gym  Walking in // bars with use of QC fwd/backwards working on recip. Gait pattern/ upright posture. 4 laps - VC's for correct sequencing cane with prosthetic limb, increasing step length on LLE.  Alternating 6-inch step taps with QC in // bars: 2x1 minute bouts - VC's for only using QC for support  Ambulation with QC along floor ladder to promote symmetrical step length: 4 laps - Min A occasional to correct loss of balance; pt better able to demonstrate reciprocal pattern towards last few laps  Amb. From // bars out to car on side of building with use of QC and CGA for safety/ cuing to increase L LE step length and cane sequencing.  Ambulation in // bars with 6-inch hurdle step-overs: 2 laps leading with each foot, single UE support on // bar   PATIENT EDUCATION:  Education details: Discussed HEP/ standing hip ex. At [employer] Person educated: Patient and Child(ren) Education method: Medical illustrator Education comprehension: verbalized understanding and returned demonstration  HOME EXERCISE PROGRAM: Access Code: [email or web address] URL: [email or web address] Date: 08/30/2022 Prepared by: Maylon Peppers  Exercises - Sit to Stand with Counter Support  - 1 x daily - 5 x weekly - 3 sets - 10 reps - Standing Hip Abduction with Counter Support  - 1 x daily - 5 x weekly - 3 sets - 10 reps - Standing March with Counter Support  - 1 x daily - 5 x weekly - 3 sets - 10 reps - Standing Hip Extension with Counter Support  - 1 x daily - 5 x weekly - 3 sets - 10 reps  ASSESSMENT:  CLINICAL IMPRESSION:     Patient arrives to PT treatment session with prosthetic limb donned and using RW. Today's session consisted of gait training and therex aimed to improve pt's LE strength and endurance, as well as improving her ambulation using a quad cane. Pt requires frequent verbal cueing to increase her step length with her LLE, particularly when using the QC. Pt has occasional losses of balance when ambulating with the QC outside the // bars requiring min A to correct. Pt did better showing symmetrical step length towards the end of the session when using the floor ladder as a visual aid/cue for each step. Pt will continue to benefit from skilled PT services to improve her ambulation and function with using a less restrictive assistive device (quad cane) and return her to her PLOF.  OBJECTIVE IMPAIRMENTS: Abnormal gait, decreased activity tolerance, decreased balance, decreased endurance, decreased mobility, difficulty walking, decreased ROM, decreased strength, decreased safety awareness, improper body mechanics, postural dysfunction, and pain.   ACTIVITY LIMITATIONS: carrying, lifting, standing, squatting, stairs, transfers, bed mobility, bathing, toileting, dressing, and locomotion level  PARTICIPATION LIMITATIONS: meal prep, cleaning, laundry, shopping, community activity, and church  PERSONAL FACTORS: Fitness and Past/current experiences are also affecting patient's functional outcome.   REHAB POTENTIAL: Good  CLINICAL DECISION MAKING: Evolving/moderate complexity  EVALUATION  COMPLEXITY:  Moderate   GOALS: Goals reviewed with patient? Yes  SHORT TERM GOALS: Target date: 09/18/22 Pt. Able to tolerate standing 10 minutes in kitchen with use of prosthetic leg to improve meal preparation.   Baseline:  limited standing tolerance Goal status: Goal met   LONG TERM GOALS: Target date: 12/27/22  Pt. Will increase FOTO to 46 to improve pain-free mobility.   Baseline: initial FOTO 29 Goal status: GOAL MET 10/09/22 (51)  2.  Pt. Will increase B LE muscle strength 1/2 muscle grade to improve pain-free mobility/ walking.   Baseline:  See above, 10/22: see above for updated scores Goal status: Partially met (see above for updated MMT)  3.  Pt. Able to ambulate 200 feet with mod. I with least assistive device to improve return to home.   Baseline: amb. Short distances with RW/ min. A from PT.  Limited endurance; 10/22: 424 feet with RW during Goal status: GOAL MET (10/22) 4. Pt will increase by at least 20m (126ft) when using LRAD (quad cane) in order to demonstrate clinically significant improvement in cardiopulmonary endurance and community ambulation  Baseline: 140 feet with quad cane  Goal status: initial    PLAN:  PT FREQUENCY: 2x/week  PT DURATION: 6 weeks  PLANNED INTERVENTIONS: Therapeutic exercises, Therapeutic activity, Neuromuscular re-education, Balance training, Gait training, Patient/Family education, Self Care, Joint mobilization, Stair training, Prosthetic training, DME instructions, Manual therapy, and Re-evaluation  PLAN FOR NEXT SESSION: Progress gait training with unilateral UE support as tolerated, progress static/dynamic balance exercises, continue with eccentric quad strengthening, fast-twitch muscle exercises (static repeats with metronome), progress gait training with quad cane as able (emphasize R hip extension and LLE step length)  Anea Fodera, SPT  Maylon Peppers, PT, DPT Physical Therapist - [facility] 12/06/22, 2:39 PM

## 2022-12-11 ENCOUNTER — Ambulatory Visit: Payer: Medicare HMO | Admitting: Physical Therapy

## 2022-12-12 ENCOUNTER — Ambulatory Visit (INDEPENDENT_AMBULATORY_CARE_PROVIDER_SITE_OTHER): Payer: Medicare HMO

## 2022-12-12 ENCOUNTER — Ambulatory Visit (INDEPENDENT_AMBULATORY_CARE_PROVIDER_SITE_OTHER): Payer: Medicare HMO | Admitting: Nurse Practitioner

## 2022-12-12 ENCOUNTER — Encounter (INDEPENDENT_AMBULATORY_CARE_PROVIDER_SITE_OTHER): Payer: Self-pay | Admitting: Nurse Practitioner

## 2022-12-12 VITALS — BP 166/75 | HR 76 | Resp 15

## 2022-12-12 DIAGNOSIS — I739 Peripheral vascular disease, unspecified: Secondary | ICD-10-CM

## 2022-12-12 DIAGNOSIS — Z89511 Acquired absence of right leg below knee: Secondary | ICD-10-CM

## 2022-12-13 ENCOUNTER — Ambulatory Visit: Payer: Medicare HMO

## 2022-12-13 ENCOUNTER — Encounter: Payer: Self-pay | Admitting: Physical Therapy

## 2022-12-13 ENCOUNTER — Encounter (INDEPENDENT_AMBULATORY_CARE_PROVIDER_SITE_OTHER): Payer: Self-pay | Admitting: Nurse Practitioner

## 2022-12-13 DIAGNOSIS — M6281 Muscle weakness (generalized): Secondary | ICD-10-CM

## 2022-12-13 DIAGNOSIS — Z89511 Acquired absence of right leg below knee: Secondary | ICD-10-CM

## 2022-12-13 DIAGNOSIS — R269 Unspecified abnormalities of gait and mobility: Secondary | ICD-10-CM

## 2022-12-13 LAB — VAS US ABI WITH/WO TBI: Left ABI: 1.26

## 2022-12-13 NOTE — Progress Notes (Signed)
Subjective:    Patient ID: Melanie Juarez, female    DOB: 08-03-1945, 77 y.o.   MRN: [id number] Chief Complaint  Patient presents with   Follow-up    3 month abi    This is a 77 year old female who had a recent right below-knee amputation.  She had a small issue going down but since then has been completely healed and has received her prosthetic and has been actively going to PT twice per week.  She is doing fairly well with this.  She denies any claudication or open wounds or ulcerations on her left lower extremity.  Today she has ABI of 1.26 on the left and 0.98 as a TBI.  She has good triphasic tibial artery waveforms in the left.    Review of Systems  Musculoskeletal:  Positive for gait problem.  All other systems reviewed and are negative.      Objective:   Physical Exam Vitals reviewed.  HENT:     Head: Normocephalic.  Cardiovascular:     Rate and Rhythm: Normal rate.     Pulses:          Dorsalis pedis pulses are detected w/ Doppler on the left side.       Posterior tibial pulses are detected w/ Doppler on the left side.  Pulmonary:     Effort: Pulmonary effort is normal.  Skin:    General: Skin is warm and dry.  Neurological:     Mental Status: She is alert and oriented to person, place, and time.  Psychiatric:        Mood and Affect: Mood normal.        Behavior: Behavior normal.        Thought Content: Thought content normal.        Judgment: Judgment normal.     BP (!) 166/75 (BP Location: Right Arm)   Pulse 76   Resp 15   Past Medical History:  Diagnosis Date   Diabetes (HCC)    Hypertension     Social History   Socioeconomic History   Marital status: Divorced    Spouse name: Not on file   Number of children: Not on file   Years of education: Not on file   Highest education level: Not on file  Occupational History   Not on file  Tobacco Use   Smoking status: Never   Smokeless tobacco: Never  Substance and Sexual Activity   Alcohol use:  Never   Drug use: Never   Sexual activity: Not Currently  Other Topics Concern   Not on file  Social History Narrative   Not on file   Social Determinants of Health   Financial Resource Strain: Not on file  Food Insecurity: No Food Insecurity (03/20/2022)   Hunger Vital Sign    Worried About Running Out of Food in the Last Year: Never true    Ran Out of Food in the Last Year: Never true  Transportation Needs: No Transportation Needs (03/20/2022)   PRAPARE - Administrator, Civil Service (Medical): No    Lack of Transportation (Non-Medical): No  Physical Activity: Not on file  Stress: Not on file  Social Connections: Not on file  Intimate Partner Violence: Not At Risk (03/20/2022)   Humiliation, Afraid, Rape, and Kick questionnaire    Fear of Current or Ex-Partner: No    Emotionally Abused: No    Physically Abused: No    Sexually Abused: No    Past  Surgical History:  Procedure Laterality Date   AMPUTATION Right 03/22/2022   Procedure: AMPUTATION BELOW KNEE-Guillotine;  Surgeon: Annice Needy, MD;  Location: [facility];  Service: Vascular;  Laterality: Right;   LOWER EXTREMITY ANGIOGRAPHY Right 03/21/2022   Procedure: Lower Extremity Angiography;  Surgeon: Annice Needy, MD;  Location: [facility];  Service: Cardiovascular;  Laterality: Right;   STUMP REVISION Right 03/26/2022   Procedure: BKA REVISION;  Surgeon: Annice Needy, MD;  Location: [facility];  Service: General;  Laterality: Right;    History reviewed. No pertinent family history.  Allergies  Allergen Reactions   Tilactase Diarrhea   Tomato Rash    Mouth breaks out       Latest Ref Rng & Units 07/08/2022    3:24 PM 03/28/2022    4:35 AM 03/27/2022    8:30 PM  CBC  WBC 4.0 - 10.5 K/uL 10.1  16.5    Hemoglobin 12.0 - 15.0 g/dL 87.5  7.4  7.7   Hematocrit 36.0 - 46.0 % 34.8  22.8  23.5   Platelets 150 - 400 K/uL 223  277        CMP     Component Value Date/Time   NA 133 (L) 07/08/2022 1524   K  4.6 07/08/2022 1524   CL 99 07/08/2022 1524   CO2 27 07/08/2022 1524   GLUCOSE 212 (H) 07/08/2022 1524   BUN 19 07/08/2022 1524   CREATININE 0.71 07/08/2022 1524   CALCIUM 9.4 07/08/2022 1524   PROT 8.0 07/08/2022 1524   ALBUMIN 3.9 07/08/2022 1524   AST 24 07/08/2022 1524   ALT 25 07/08/2022 1524   ALKPHOS 61 07/08/2022 1524   BILITOT 1.2 07/08/2022 1524   GFRNONAA >60 07/08/2022 1524     No results found.     Assessment & Plan:   1. Hx of right BKA (HCC) Currently the patient has a prosthetic he was walking with this.  She was doing physical therapy twice a week and in general doing well  2. PAD (peripheral artery disease) (HCC) Today noninvasive study shows open patient is normal ABIs.  Currently no role for intervention.  We will continue to monitor annually.  They are advised to let us know if there is any change in her symptoms.  This is especially true if ulcerations develop.   Current Outpatient Medications on File Prior to Visit  Medication Sig Dispense Refill   acetaminophen (TYLENOL) 500 MG tablet Take 500 mg by mouth every 6 (six) hours as needed.     ascorbic acid (VITAMIN C) 500 MG tablet Take 500 mg by mouth daily.     aspirin EC 81 MG tablet Take 81 mg by mouth daily. Swallow whole.     atenolol (TENORMIN) 50 MG tablet Take 50 mg by mouth daily.     atorvastatin (LIPITOR) 40 MG tablet Take 1 tablet (40 mg total) by mouth daily.     chlorthalidone (HYGROTON) 25 MG tablet Take 25 mg by mouth daily.     diphenhydrAMINE (BENADRYL) 25 MG tablet Take 0.5 tablets (12.5 mg total) by mouth every 6 (six) hours as needed for up to 5 days for itching. 10 tablet 0   Ensure Max Protein (ENSURE MAX PROTEIN) LIQD Take 330 mLs (11 oz total) by mouth 2 (two) times daily. (Patient taking differently: Take 11 oz by mouth as needed.)     ferrous sulfate 325 (65 FE) MG EC tablet Take by mouth.  glipiZIDE (GLUCOTROL) 5 MG tablet Take 0.5 tablets (2.5 mg total) by mouth 2 (two)  times daily before a meal.     metFORMIN (GLUCOPHAGE) 500 MG tablet Take 500 mg by mouth 2 (two) times daily.     mupirocin ointment (BACTROBAN) 2 % Apply 1 Application topically 2 (two) times daily. 22 g 3   ONETOUCH ULTRA test strip USE 1 STRIP DAILY AS DIRECTED     melatonin (MELATONIN MAXIMUM STRENGTH) 5 MG TABS Take by mouth at bedtime as needed.     ondansetron (ZOFRAN-ODT) 4 MG disintegrating tablet Take 1 tablet (4 mg total) by mouth every 8 (eight) hours as needed for nausea or vomiting. 20 tablet 0   [DISCONTINUED] mupirocin cream (BACTROBAN) 2 % Apply 1 Application topically 2 (two) times daily. 30 g 3   No current facility-administered medications on file prior to visit.    There are no Patient Instructions on file for this visit. No follow-ups on file.   Georgiana Spinner, NP

## 2022-12-13 NOTE — Therapy (Signed)
OUTPATIENT PHYSICAL THERAPY LOWER EXTREMITY TREATMENT   Patient Name: Melanie Juarez MRN: [id number] DOB:1945/09/09, 77 y.o., female Today's Date: 12/13/2022  END OF SESSION:  PT End of Session - 12/13/22 1314     Visit Number 27    Number of Visits 35    Date for PT Re-Evaluation 12/27/22    PT Start Time 1313    PT Stop Time 1352    PT Time Calculation (min) 39 min    Equipment Utilized During Treatment Gait belt    Activity Tolerance Patient tolerated treatment well    Behavior During Therapy WFL for tasks assessed/performed                   Past Medical History:  Diagnosis Date   Diabetes (HCC)    Hypertension    Past Surgical History:  Procedure Laterality Date   AMPUTATION Right 03/22/2022   Procedure: AMPUTATION BELOW KNEE-Guillotine;  Surgeon: Melanie Needy, MD;  Location: [facility];  Service: Vascular;  Laterality: Right;   LOWER EXTREMITY ANGIOGRAPHY Right 03/21/2022   Procedure: Lower Extremity Angiography;  Surgeon: Melanie Needy, MD;  Location: [facility];  Service: Cardiovascular;  Laterality: Right;   STUMP REVISION Right 03/26/2022   Procedure: BKA REVISION;  Surgeon: Melanie Needy, MD;  Location: [facility];  Service: General;  Laterality: Right;   Patient Active Problem List   Diagnosis Date Noted   Hx of BKA, right (HCC) 08/08/2022   Lactic acidosis 03/21/2022   Obesity (BMI 30-39.9) 03/21/2022   PAD (peripheral artery disease) (HCC) 03/21/2022   Osteomyelitis (HCC) 03/19/2022   Diabetic infection of right foot (HCC) 03/19/2022   Essential hypertension 03/19/2022   Leukocytosis 03/19/2022   AKI (acute kidney injury) (HCC) 03/19/2022   Sepsis with acute renal failure without septic shock (HCC) 03/19/2022   Hyperlipidemia 05/17/2014   Type 2 diabetes mellitus (HCC) 05/17/2014   Anemia, unspecified 05/18/2013   Benign essential hypertension 05/18/2013   REFERRING PROVIDER: Annice Needy, MD  REFERRING DIAG: s/p R BKA  THERAPY DIAG:  Hx  of BKA, right (HCC)  Muscle weakness (generalized)  Gait difficulty  Rationale for Evaluation and Treatment: Rehabilitation  ONSET DATE: 03/22/22  SUBJECTIVE:   SUBJECTIVE STATEMENT: Pt. Had a sore on R foot resulting in R BKA on 03/22/22.  Pt. Entered Pt with use of w/c and daughter Melanie Juarez) present.  Pt. Wants to return to living independently.  Pt. Completing HEP from rehab center.  Pt. Has been unable to get into shower.    PERTINENT HISTORY: Melanie Juarez is a 77 y.o. (Mar 30, 1945) female who presents with chief complaint of     Chief Complaint  Patient presents with   Venous Insufficiency  .   History of Present Illness: Patient returns today in follow up of her right BKA.  It is healed.  She is in the process of getting her prosthesis now.  She is doing well.  No pain.  No left leg ulceration or rest pain.    Assessment/Plan   PAD (peripheral artery disease) (HCC) Check left ABI on follow up visit in 3 months.    Type 2 diabetes mellitus (HCC) blood glucose control important in reducing the progression of atherosclerotic disease. Also, involved in wound healing. On appropriate medications.    Essential hypertension blood pressure control important in reducing the progression of atherosclerotic disease. On appropriate oral medications.    Hx of BKA, right (HCC) Healed.  In the process of  getting her prosthesis.  A new prescription given for physical therapy for prosthesis and ambulatory training.   Melanie Barren, MD   08/08/2022 10:42 AM  PAIN:  Are you having pain? No.  Pt. Reports phantom limb symptoms but not pain.    PRECAUTIONS: Fall  RED FLAGS: None   WEIGHT BEARING RESTRICTIONS: No  FALLS:  Has patient fallen in last 6 months? No  LIVING ENVIRONMENT: Lives with: lives with their daughter  Melanie Juarez).   Lives in: House/apartment Stairs: No Has following equipment at home: Dan Humphreys - 2 wheeled, Wheelchair (manual), and Ramped entry  OCCUPATION:  Retired  PLOF: Independent.  Pt. Does not drive.    PATIENT GOALS: Ambulate with least assistive device.  Return home independently/ go back to church.    NEXT MD VISIT: Melanie Juarez 7/31.  MD f/u in September?  OBJECTIVE:   PATIENT SURVEYS:  FOTO initial 29/ goal 61.    COGNITION: Overall cognitive status: Within functional limits for tasks assessed     SENSATION: WFL  EDEMA:  No R residual limb swelling noted.  PT discussed use of shrinker and proper use of ply socks for improved fit of prosthetic leg.    POSTURE: rounded shoulders and forward head  PALPATION: Minimal tenderness along R distal residual limb/ incision.  Good incision healing/ PT discussed benefits of scar massage.    LOWER EXTREMITY ROM:  Active ROM Right eval Left eval  Hip flexion [facility] WFL  Hip extension NT NT  Hip abduction [facility]  Hip adduction    Hip internal rotation    Hip external rotation    Knee flexion [facility] WFL  Knee extension -3 deg. 0 deg.  Ankle dorsiflexion    Ankle plantarflexion    Ankle inversion    Ankle eversion     (Blank rows = not tested)  LOWER EXTREMITY MMT:  MMT Right eval Left eval  Hip flexion 4/5 4/5  Hip extension    Hip abduction 3+/5 4/5  Hip adduction    Hip internal rotation    Hip external rotation    Knee flexion 4+/5  4+/5  Knee extension 4+/5 5/5  Ankle dorsiflexion    Ankle plantarflexion    Ankle inversion    Ankle eversion     (Blank rows = not tested)   B UE AROM WFL.  B UE strength grossly 4/5 MMT except bicep/tricep 4+/5 MMT  FUNCTIONAL TESTS:  5 times sit to stand: TBD Timed up and go (TUG): TBD 6 minute walk test: TBD  GAIT: Distance walked: 45 feet in gym with RW/ min. A for safety and cuing Assistive device utilized: [employer] - 2 wheeled Level of assistance: Min A Comments: amb. In gym with use of RW/min A for cuing to increase step length/ heel strike.  Pt. Fearful while walking outside of //-bars.  Pt. Ambs. In //-bars with B UE  assist and increase cadence/ step pattern.  Use of mirror for posture correction/ heel strike (pt. Does not looking at a mirror).     TODAY'S TREATMENT:  DATE: 12/13/2022   Subjective:  Pt. entered PT with use of RW and prosthetic leg donned. Still having trouble with her R eye and difficulty seeing out of this eye. No other new complaints this date.    Treatment:   There.ex.:  Nustep level 4 (seat 10) x 10 minutes to improve cardiorespiratory endurance.  Forward Step-ups onto 6 inch step: 1x8 each leg, single UE support   Gait Training: Walking in gym with use of QC only and focus on consistent recip. Gait pattern.  Limited L step length  and challenged with maintaining proper BOS, 1 lap around gym  Alternating 6-inch step taps with QC; 2x1 minute bouts - VC's for only using QC for support  Alternating 6 inch step ups with QC and R handrail x 10 on each LE  Ambulation with QC along floor ladder to promote symmetrical step length: 4 laps - Min A occasional to correct loss of balance; pt better able to demonstrate reciprocal pattern towards last few laps  Ambulation to car from stairs in gym with RW and supervision   PATIENT EDUCATION:  Education details: Discussed HEP/ standing hip ex. At [employer] Person educated: Patient and Child(ren) Education method: Medical illustrator Education comprehension: verbalized understanding and returned demonstration  HOME EXERCISE PROGRAM: Access Code: [email or web address] URL: [email or web address] Date: 08/30/2022 Prepared by: Maylon Peppers  Exercises - Sit to Stand with Counter Support  - 1 x daily - 5 x weekly - 3 sets - 10 reps - Standing Hip Abduction with Counter Support  - 1 x daily - 5 x weekly - 3 sets - 10 reps - Standing March with Counter Support  - 1 x daily - 5 x weekly - 3 sets - 10  reps - Standing Hip Extension with Counter Support  - 1 x daily - 5 x weekly - 3 sets - 10 reps  ASSESSMENT:  CLINICAL IMPRESSION:       Patient arrives to PT treatment session with prosthetic limb donned and using RW. Patient arrives late so tailored session to time restraints. Session continues to focus on reciprocal gait pattern with equal step length bilaterally. Continues to have minor LOB with ambulation with QC. Agility ladder improves symmetrical step length with use of visual aid/cue. Pt will continue to benefit from skilled PT services to improve her ambulation and function with using a less restrictive assistive device (quad cane) and return her to her PLOF.  OBJECTIVE IMPAIRMENTS: Abnormal gait, decreased activity tolerance, decreased balance, decreased endurance, decreased mobility, difficulty walking, decreased ROM, decreased strength, decreased safety awareness, improper body mechanics, postural dysfunction, and pain.   ACTIVITY LIMITATIONS: carrying, lifting, standing, squatting, stairs, transfers, bed mobility, bathing, toileting, dressing, and locomotion level  PARTICIPATION LIMITATIONS: meal prep, cleaning, laundry, shopping, community activity, and church  PERSONAL FACTORS: Fitness and Past/current experiences are also affecting patient's functional outcome.   REHAB POTENTIAL: Good  CLINICAL DECISION MAKING: Evolving/moderate complexity  EVALUATION COMPLEXITY: Moderate   GOALS: Goals reviewed with patient? Yes  SHORT TERM GOALS: Target date: 09/18/22 Pt. Able to tolerate standing 10 minutes in kitchen with use of prosthetic leg to improve meal preparation.   Baseline:  limited standing tolerance Goal status: Goal met   LONG TERM GOALS: Target date: 12/27/22  Pt. Will increase FOTO to 46 to improve pain-free mobility.   Baseline: initial FOTO 29 Goal status: GOAL MET 10/09/22 (51)  2.  Pt. Will increase B LE muscle strength 1/2 muscle grade to improve pain-free  mobility/ walking.  Baseline:  See above, 10/22: see above for updated scores Goal status: Partially met (see above for updated MMT)  3.  Pt. Able to ambulate 200 feet with mod. I with least assistive device to improve return to home.   Baseline: amb. Short distances with RW/ min. A from PT.  Limited endurance; 10/22: 424 feet with RW during Goal status: GOAL MET (10/22) 4. Pt will increase by at least 48m (165ft) when using LRAD (quad cane) in order to demonstrate clinically significant improvement in cardiopulmonary endurance and community ambulation  Baseline: 140 feet with quad cane  Goal status: initial    PLAN:  PT FREQUENCY: 2x/week  PT DURATION: 6 weeks  PLANNED INTERVENTIONS: Therapeutic exercises, Therapeutic activity, Neuromuscular re-education, Balance training, Gait training, Patient/Family education, Self Care, Joint mobilization, Stair training, Prosthetic training, DME instructions, Manual therapy, and Re-evaluation  PLAN FOR NEXT SESSION: Progress gait training with unilateral UE support as tolerated, progress static/dynamic balance exercises, continue with eccentric quad strengthening, fast-twitch muscle exercises (static repeats with metronome), progress gait training with quad cane as able (emphasize R hip extension and LLE step length)  Maylon Peppers, PT, DPT Physical Therapist - [facility] 12/13/22, 1:56 PM

## 2022-12-17 ENCOUNTER — Encounter: Payer: Self-pay | Admitting: Physical Therapy

## 2022-12-17 ENCOUNTER — Ambulatory Visit: Payer: Medicare HMO | Attending: Vascular Surgery | Admitting: Physical Therapy

## 2022-12-17 DIAGNOSIS — R269 Unspecified abnormalities of gait and mobility: Secondary | ICD-10-CM | POA: Diagnosis not present

## 2022-12-17 DIAGNOSIS — M6281 Muscle weakness (generalized): Secondary | ICD-10-CM | POA: Diagnosis not present

## 2022-12-17 DIAGNOSIS — Z89511 Acquired absence of right leg below knee: Secondary | ICD-10-CM | POA: Insufficient documentation

## 2022-12-17 NOTE — Therapy (Signed)
OUTPATIENT PHYSICAL THERAPY LOWER EXTREMITY TREATMENT   Patient Name: Melanie Juarez MRN: [id number] DOB:07-14-1945, 77 y.o., female Today's Date: 12/17/2022  END OF SESSION:  PT End of Session - 12/17/22 1303     Visit Number 28    Number of Visits 35    Date for PT Re-Evaluation 12/27/22    PT Start Time 1300    PT Stop Time 1346    PT Time Calculation (min) 46 min    Equipment Utilized During Treatment Gait belt    Activity Tolerance Patient tolerated treatment well    Behavior During Therapy WFL for tasks assessed/performed                    Past Medical History:  Diagnosis Date   Diabetes (HCC)    Hypertension    Past Surgical History:  Procedure Laterality Date   AMPUTATION Right 03/22/2022   Procedure: AMPUTATION BELOW KNEE-Guillotine;  Surgeon: Annice Needy, MD;  Location: [facility];  Service: Vascular;  Laterality: Right;   LOWER EXTREMITY ANGIOGRAPHY Right 03/21/2022   Procedure: Lower Extremity Angiography;  Surgeon: Annice Needy, MD;  Location: [facility];  Service: Cardiovascular;  Laterality: Right;   STUMP REVISION Right 03/26/2022   Procedure: BKA REVISION;  Surgeon: Annice Needy, MD;  Location: [facility] OR[facility];  Service: General;  Laterality: Right;   Patient Active Problem List   Diagnosis Date Noted   Hx of BKA, right (HCC) 08/08/2022   Lactic acidosis 03/21/2022   Obesity (BMI 30-39.9) 03/21/2022   PAD (peripheral artery disease) (HCC) 03/21/2022   Osteomyelitis (HCC) 03/19/2022   Diabetic infection of right foot (HCC) 03/19/2022   Essential hypertension 03/19/2022   Leukocytosis 03/19/2022   AKI (acute kidney injury) (HCC) 03/19/2022   Sepsis with acute renal failure without septic shock (HCC) 03/19/2022   Hyperlipidemia 05/17/2014   Type 2 diabetes mellitus (HCC) 05/17/2014   Anemia, unspecified 05/18/2013   Benign essential hypertension 05/18/2013   REFERRING PROVIDER: Annice Needy, MD  REFERRING DIAG: s/p R BKA  THERAPY DIAG:   Hx of BKA, right (HCC)  Muscle weakness (generalized)  Rationale for Evaluation and Treatment: Rehabilitation  ONSET DATE: 03/22/22  SUBJECTIVE:   SUBJECTIVE STATEMENT: Pt. Had a sore on R foot resulting in R BKA on 03/22/22.  [name]  Pt. Wants to return to living independently.  Pt. Completing HEP from rehab center.  Pt. Has been unable to get into shower.    PERTINENT HISTORY: COREE RIESTER is a 77 y.o. ([birth year]) female who presents with chief complaint of     Chief Complaint  Patient presents with   Venous Insufficiency  .   History of Present Illness: Patient returns today in follow up of her right BKA.  It is healed.  She is in the process of getting her prosthesis now.  She is doing well.  No pain.  No left leg ulceration or rest pain.    Assessment/Plan   PAD (peripheral artery disease) (HCC) Check left ABI on follow up visit in 3 months.    Type 2 diabetes mellitus (HCC) blood glucose control important in reducing the progression of atherosclerotic disease. Also, involved in wound healing. On appropriate medications.    Essential hypertension blood pressure control important in reducing the progression of atherosclerotic disease. On appropriate oral medications.    Hx of BKA, right (HCC) Healed.  In the process of getting her  prosthesis.  A new prescription given for physical therapy for prosthesis and ambulatory training.   [name], MD   08/08/2022 10:42 AM  PAIN:  Are you having pain? No.  Pt. Reports phantom limb symptoms but not pain.    PRECAUTIONS: Fall  RED FLAGS: None   WEIGHT BEARING RESTRICTIONS: No  FALLS:  Has patient fallen in last 6 months? No  LIVING ENVIRONMENT: Lives with: lives with their daughter  [name].   Lives in: House/apartment Stairs: No Has following equipment at home: Dan Humphreys - 2 wheeled, Wheelchair (manual), and Ramped entry  OCCUPATION: Retired  PLOF:  Independent.  Pt. Does not drive.    PATIENT GOALS: Ambulate with least assistive device.  Return home independently/ go back to church.    NEXT MD VISIT: [name] 7/31.  MD f/u in September?  OBJECTIVE:   PATIENT SURVEYS:  FOTO initial 29/ goal 84.    COGNITION: Overall cognitive status: Within functional limits for tasks assessed     SENSATION: WFL  EDEMA:  No R residual limb swelling noted.  PT discussed use of shrinker and proper use of ply socks for improved fit of prosthetic leg.    POSTURE: rounded shoulders and forward head  PALPATION: Minimal tenderness along R distal residual limb/ incision.  Good incision healing/ PT discussed benefits of scar massage.    LOWER EXTREMITY ROM:  Active ROM Right eval Left eval  Hip flexion [facility] WFL  Hip extension NT NT  Hip abduction [facility]  Hip adduction    Hip internal rotation    Hip external rotation    Knee flexion [facility] WFL  Knee extension -3 deg. 0 deg.  Ankle dorsiflexion    Ankle plantarflexion    Ankle inversion    Ankle eversion     (Blank rows = not tested)  LOWER EXTREMITY MMT:  MMT Right eval Left eval  Hip flexion 4/5 4/5  Hip extension    Hip abduction 3+/5 4/5  Hip adduction    Hip internal rotation    Hip external rotation    Knee flexion 4+/5  4+/5  Knee extension 4+/5 5/5  Ankle dorsiflexion    Ankle plantarflexion    Ankle inversion    Ankle eversion     (Blank rows = not tested)   B UE AROM WFL.  B UE strength grossly 4/5 MMT except bicep/tricep 4+/5 MMT  FUNCTIONAL TESTS:  5 times sit to stand: TBD Timed up and go (TUG): TBD 6 minute walk test: TBD  GAIT: Distance walked: 45 feet in gym with RW/ min. A for safety and cuing Assistive device utilized: Walker - 2 wheeled Level of assistance: Min A Comments: amb. In gym with use of RW/min A for cuing to increase step length/ heel strike.  Pt. Fearful while walking outside of //-bars.  Pt. Ambs. In //-bars with B UE assist and  increase cadence/ step pattern.  Use of mirror for posture correction/ heel strike (pt. Does not looking at a mirror).     TODAY'S TREATMENT:  DATE: 12/17/2022   Subjective:  Pt. entered PT with use of RW and prosthetic leg donned. No significant changes with her eye since last week. She reports she is still walking and doing her exercises at home; no falls or stumbles reported.   Treatment:   There.ex.:  Nustep level 4 (seat 10) x 10 minutes to improve cardiorespiratory endurance.   Gait Training: Walking in gym with use of QC only and focus on consistent recip. Gait pattern - 1 lap - pt demonstrated improved reciprocal pattern; still requires occasional VC's for increased step length with LLE  Alternating 6-inch step taps with QC; 2x10 each leg (1st set with 4# AW on each leg) - VC's for only using QC for support - pt required R UE support on rail periodically on first set  Ambulation to car from gym to car with QC and CGA ~ 50 feet  Blaze Pods: home base setting x 2 rounds with QC and CGA - to improve turns, visual scanning with QC    Not today: Alternating 6 inch step ups with QC and R handrail x 10 on each LE Forward Step-ups onto 6 inch step: 1x8 each leg, single UE support, 5# AW on each leg Ambulation with QC along floor ladder to promote symmetrical step length: 2 laps - Min A occasional to correct loss of balance; pt better able to demonstrate reciprocal pattern towards last few laps  PATIENT EDUCATION:  Education details: Discussed HEP/ standing hip ex. At [employer] Person educated: Patient and Child(ren) Education method: Medical illustrator Education comprehension: verbalized understanding and returned demonstration  HOME EXERCISE PROGRAM: Access Code: [email or web address] URL: [email or web address] Date: 08/30/2022 Prepared  by: Maylon Peppers  Exercises - Sit to Stand with Counter Support  - 1 x daily - 5 x weekly - 3 sets - 10 reps - Standing Hip Abduction with Counter Support  - 1 x daily - 5 x weekly - 3 sets - 10 reps - Standing March with Counter Support  - 1 x daily - 5 x weekly - 3 sets - 10 reps - Standing Hip Extension with Counter Support  - 1 x daily - 5 x weekly - 3 sets - 10 reps  ASSESSMENT:  CLINICAL IMPRESSION:       Patient arrives to PT treatment session with prosthetic limb donned and using QC. Today's session consisted of exercises to improve pt's hip strength as well as her static stability while using a QC. Gait training continues to emphasize reciprocal gait pattern and increasing LLE step length; pt still has occasional instances of freezing her gait secondary to losing her balance however appeared more comfortable using QC today. Pt still required occasional cueing for increasing step length but was more consistent overall with demonstrating symmetrical step length. Blaze pods used at end of session as apart of her gait training to improve her ability to perform turns/changes of direction and scan her environment while using a QC. Pt will continue to benefit from skilled PT services to improve her ambulation and function with using a less restrictive assistive device (quad cane) and return her to her PLOF.  OBJECTIVE IMPAIRMENTS: Abnormal gait, decreased activity tolerance, decreased balance, decreased endurance, decreased mobility, difficulty walking, decreased ROM, decreased strength, decreased safety awareness, improper body mechanics, postural dysfunction, and pain.   ACTIVITY LIMITATIONS: carrying, lifting, standing, squatting, stairs, transfers, bed mobility, bathing, toileting, dressing, and locomotion level  PARTICIPATION LIMITATIONS: meal prep, cleaning, laundry, shopping, community activity, and church  PERSONAL FACTORS:  Fitness and Past/current experiences are also affecting  patient's functional outcome.   REHAB POTENTIAL: Good  CLINICAL DECISION MAKING: Evolving/moderate complexity  EVALUATION COMPLEXITY: Moderate   GOALS: Goals reviewed with patient? Yes  SHORT TERM GOALS: Target date: 09/18/22 Pt. Able to tolerate standing 10 minutes in kitchen with use of prosthetic leg to improve meal preparation.   Baseline:  limited standing tolerance Goal status: Goal met   LONG TERM GOALS: Target date: 12/27/22  Pt. Will increase FOTO to 46 to improve pain-free mobility.   Baseline: initial FOTO 29 Goal status: GOAL MET 10/09/22 (51)  2.  Pt. Will increase B LE muscle strength 1/2 muscle grade to improve pain-free mobility/ walking.   Baseline:  See above, 10/22: see above for updated scores Goal status: Partially met (see above for updated MMT)  3.  Pt. Able to ambulate 200 feet with mod. I with least assistive device to improve return to home.   Baseline: amb. Short distances with RW/ min. A from PT.  Limited endurance; 10/22: 424 feet with RW during Goal status: GOAL MET (10/22) 4. Pt will increase by at least 34m (148ft) when using LRAD (quad cane) in order to demonstrate clinically significant improvement in cardiopulmonary endurance and community ambulation  Baseline: 140 feet with quad cane  Goal status: initial    PLAN:  PT FREQUENCY: 2x/week  PT DURATION: 6 weeks  PLANNED INTERVENTIONS: Therapeutic exercises, Therapeutic activity, Neuromuscular re-education, Balance training, Gait training, Patient/Family education, Self Care, Joint mobilization, Stair training, Prosthetic training, DME instructions, Manual therapy, and Re-evaluation  PLAN FOR NEXT SESSION: Progress gait training with unilateral UE support as tolerated, progress static/dynamic balance exercises, continue with eccentric quad strengthening, fast-twitch muscle exercises (static repeats with metronome), progress gait training with quad cane as able (emphasize R hip  extension and LLE step length), blaze pods for turns/visual scanning practice with QC, step-taps with QC for HEP   Cammie Mcgee, PT, DPT # [id number] Cena Benton, SPT 12/17/22, 1:58 PM

## 2022-12-19 ENCOUNTER — Ambulatory Visit: Payer: Medicare HMO | Admitting: Physical Therapy

## 2022-12-19 ENCOUNTER — Encounter: Payer: Self-pay | Admitting: Physical Therapy

## 2022-12-19 DIAGNOSIS — M6281 Muscle weakness (generalized): Secondary | ICD-10-CM

## 2022-12-19 DIAGNOSIS — Z89511 Acquired absence of right leg below knee: Secondary | ICD-10-CM

## 2022-12-19 DIAGNOSIS — R269 Unspecified abnormalities of gait and mobility: Secondary | ICD-10-CM | POA: Diagnosis not present

## 2022-12-19 NOTE — Therapy (Signed)
OUTPATIENT PHYSICAL THERAPY LOWER EXTREMITY TREATMENT  Patient Name: Melanie Juarez MRN: [id number] DOB:Jan 25, 1946, 77 y.o., female Today's Date: 12/19/2022  END OF SESSION:  PT End of Session - 12/19/22 1302     Visit Number 29    Number of Visits 35    Date for PT Re-Evaluation 12/27/22    PT Start Time 1300    PT Stop Time 1347    PT Time Calculation (min) 47 min    Equipment Utilized During Treatment Gait belt    Activity Tolerance Patient tolerated treatment well    Behavior During Therapy WFL for tasks assessed/performed              Past Medical History:  Diagnosis Date   Diabetes (HCC)    Hypertension    Past Surgical History:  Procedure Laterality Date   AMPUTATION Right 03/22/2022   Procedure: AMPUTATION BELOW KNEE-Guillotine;  Surgeon: Melanie Needy, MD;  Location: [facility];  Service: Vascular;  Laterality: Right;   LOWER EXTREMITY ANGIOGRAPHY Right 03/21/2022   Procedure: Lower Extremity Angiography;  Surgeon: Melanie Needy, MD;  Location: [facility];  Service: Cardiovascular;  Laterality: Right;   STUMP REVISION Right 03/26/2022   Procedure: BKA REVISION;  Surgeon: Melanie Needy, MD;  Location: [facility];  Service: General;  Laterality: Right;   Patient Active Problem List   Diagnosis Date Noted   Hx of BKA, right (HCC) 08/08/2022   Lactic acidosis 03/21/2022   Obesity (BMI 30-39.9) 03/21/2022   PAD (peripheral artery disease) (HCC) 03/21/2022   Osteomyelitis (HCC) 03/19/2022   Diabetic infection of right foot (HCC) 03/19/2022   Essential hypertension 03/19/2022   Leukocytosis 03/19/2022   AKI (acute kidney injury) (HCC) 03/19/2022   Sepsis with acute renal failure without septic shock (HCC) 03/19/2022   Hyperlipidemia 05/17/2014   Type 2 diabetes mellitus (HCC) 05/17/2014   Anemia, unspecified 05/18/2013   Benign essential hypertension 05/18/2013   REFERRING PROVIDER: Annice Needy, MD  REFERRING DIAG: s/p R BKA  THERAPY DIAG:  Hx of BKA,  right (HCC)  Muscle weakness (generalized)  Gait difficulty  Rationale for Evaluation and Treatment: Rehabilitation  ONSET DATE: 03/22/22  SUBJECTIVE:   SUBJECTIVE STATEMENT: Pt. Had a sore on R foot resulting in R BKA on 03/22/22.  Pt. Entered Pt with use of w/c and daughter Melanie Juarez) present.  Pt. Wants to return to living independently.  Pt. Completing HEP from rehab center.  Pt. Has been unable to get into shower.    PERTINENT HISTORY: Melanie Juarez is a 77 y.o. (Jun 18, 1945) female who presents with chief complaint of     Chief Complaint  Patient presents with   Venous Insufficiency  .   History of Present Illness: Patient returns today in follow up of her right BKA.  It is healed.  She is in the process of getting her prosthesis now.  She is doing well.  No pain.  No left leg ulceration or rest pain.    Assessment/Plan   PAD (peripheral artery disease) (HCC) Check left ABI on follow up visit in 3 months.    Type 2 diabetes mellitus (HCC) blood glucose control important in reducing the progression of atherosclerotic disease. Also, involved in wound healing. On appropriate medications.    Essential hypertension blood pressure control important in reducing the progression of atherosclerotic disease. On appropriate oral medications.    Hx of BKA, right (HCC) Healed.  In the process of getting her prosthesis.  A new  prescription given for physical therapy for prosthesis and ambulatory training.   Melanie Barren, MD   08/08/2022 10:42 AM  PAIN:  Are you having pain? No.  Pt. Reports phantom limb symptoms but not pain.    PRECAUTIONS: Fall  RED FLAGS: None   WEIGHT BEARING RESTRICTIONS: No  FALLS:  Has patient fallen in last 6 months? No  LIVING ENVIRONMENT: Lives with: lives with their daughter  Melanie Juarez).   Lives in: House/apartment Stairs: No Has following equipment at home: Dan Humphreys - 2 wheeled, Wheelchair (manual), and Ramped entry  OCCUPATION: Retired  PLOF:  Independent.  Pt. Does not drive.    PATIENT GOALS: Ambulate with least assistive device.  Return home independently/ go back to church.    NEXT MD VISIT: Melanie Juarez 7/31.  MD f/u in September?  OBJECTIVE:   PATIENT SURVEYS:  FOTO initial 29/ goal 28.    COGNITION: Overall cognitive status: Within functional limits for tasks assessed   SENSATION: WFL  EDEMA:  No R residual limb swelling noted.  PT discussed use of shrinker and proper use of ply socks for improved fit of prosthetic leg.    POSTURE: rounded shoulders and forward head  PALPATION: Minimal tenderness along R distal residual limb/ incision.  Good incision healing/ PT discussed benefits of scar massage.    LOWER EXTREMITY ROM:  Active ROM Right eval Left eval  Hip flexion [facility] WFL  Hip extension NT NT  Hip abduction [facility]  Hip adduction    Hip internal rotation    Hip external rotation    Knee flexion [facility] WFL  Knee extension -3 deg. 0 deg.  Ankle dorsiflexion    Ankle plantarflexion    Ankle inversion    Ankle eversion     (Blank rows = not tested)  LOWER EXTREMITY MMT:  MMT Right eval Left eval  Hip flexion 4/5 4/5  Hip extension    Hip abduction 3+/5 4/5  Hip adduction    Hip internal rotation    Hip external rotation    Knee flexion 4+/5  4+/5  Knee extension 4+/5 5/5  Ankle dorsiflexion    Ankle plantarflexion    Ankle inversion    Ankle eversion     (Blank rows = not tested)   B UE AROM WFL.  B UE strength grossly 4/5 MMT except bicep/tricep 4+/5 MMT  FUNCTIONAL TESTS:  5 times sit to stand: TBD Timed up and go (TUG): TBD 6 minute walk test: TBD  GAIT: Distance walked: 45 feet in gym with RW/ min. A for safety and cuing Assistive device utilized: Walker - 2 wheeled Level of assistance: Min A Comments: amb. In gym with use of RW/min A for cuing to increase step length/ heel strike.  Pt. Fearful while walking outside of //-bars.  Pt. Ambs. In //-bars with B UE assist and increase  cadence/ step pattern.  Use of mirror for posture correction/ heel strike (pt. Does not looking at a mirror).     TODAY'S TREATMENT:  DATE: 12/19/2022   Subjective:  Pt. entered PT with use of RW and prosthetic leg donned. No significant changes with her eye since last week. No falls since last session; HEP still going well.   Treatment:   There.ex.:  Fwd 6-inch step-ups: 1x10 each leg with 4# AW, BUE support  Standing hip flexion/abduction/extension repeats with 4# AW, 1 minute bouts each direction/leg - increased RLE fatigue at end of exercise   Gait Training: Walking in gym with use of QC only and focus on consistent recip. Gait pattern - 1 lap - pt demonstrated improved reciprocal pattern; still requires occasional VC's for increased step length with LLE  Alternating 6-inch step taps with QC; 1x10 each leg, 4# AW - VC's for only using QC for support  Ambulation to car from gym to car with QC and CGA ~ 50 feet  Blaze Pods: home base setting x 2 rounds with QC and CGA - to improve turns, visual scanning with QC    Not today: Nustep level 4 (seat 10) x 10 minutes to improve cardiorespiratory endurance. Alternating 6 inch step ups with QC and R handrail x 10 on each LE Forward Step-ups onto 6 inch step: 1x8 each leg, single UE support, 5# AW on each leg Ambulation with QC along floor ladder to promote symmetrical step length: 2 laps - Min A occasional to correct loss of balance; pt better able to demonstrate reciprocal pattern towards last few laps  PATIENT EDUCATION:  Education details: Discussed HEP/ standing hip ex. At [employer] Person educated: Patient and Child(ren) Education method: Medical illustrator Education comprehension: verbalized understanding and returned demonstration  HOME EXERCISE PROGRAM: Access Code: [email or web address] URL:  [email or web address] Date: 08/30/2022 Prepared by: Maylon Peppers  Exercises - Sit to Stand with Counter Support  - 1 x daily - 5 x weekly - 3 sets - 10 reps - Standing Hip Abduction with Counter Support  - 1 x daily - 5 x weekly - 3 sets - 10 reps - Standing March with Counter Support  - 1 x daily - 5 x weekly - 3 sets - 10 reps - Standing Hip Extension with Counter Support  - 1 x daily - 5 x weekly - 3 sets - 10 reps  ASSESSMENT:  CLINICAL IMPRESSION:       Patient arrives to PT treatment session with prosthetic limb donned and using QC. Blaze pods used again to improve pt's ambulation with QC; activities emphasized multi-direction turns, visual scanning, and stepping with LLE all while using a QC. Strengthening exercises emphasized improving hip strength and endurance bilaterally; pt reported increased RLE (prosthetic side) fatigue after static standing repeats. Pt tolerated all exercises well with no reports of pain. She had one loss of balance today while ambulating with a QC around the clinic that required min A from the PT to correct; pt reports feeling like she was walking too fast. Pt will continue to benefit from skilled PT services to improve her ambulation and function with using a less restrictive assistive device (quad cane) and return her to her PLOF.  OBJECTIVE IMPAIRMENTS: Abnormal gait, decreased activity tolerance, decreased balance, decreased endurance, decreased mobility, difficulty walking, decreased ROM, decreased strength, decreased safety awareness, improper body mechanics, postural dysfunction, and pain.   ACTIVITY LIMITATIONS: carrying, lifting, standing, squatting, stairs, transfers, bed mobility, bathing, toileting, dressing, and locomotion level  PARTICIPATION LIMITATIONS: meal prep, cleaning, laundry, shopping, community activity, and church  PERSONAL FACTORS: Fitness and Past/current experiences are also affecting patient's functional  outcome.    REHAB POTENTIAL: Good  CLINICAL DECISION MAKING: Evolving/moderate complexity  EVALUATION COMPLEXITY: Moderate   GOALS: Goals reviewed with patient? Yes  SHORT TERM GOALS: Target date: 09/18/22 Pt. Able to tolerate standing 10 minutes in kitchen with use of prosthetic leg to improve meal preparation.   Baseline:  limited standing tolerance Goal status: Goal met   LONG TERM GOALS: Target date: 12/27/22  Pt. Will increase FOTO to 46 to improve pain-free mobility.   Baseline: initial FOTO 29 Goal status: GOAL MET 10/09/22 (51)  2.  Pt. Will increase B LE muscle strength 1/2 muscle grade to improve pain-free mobility/ walking.   Baseline:  See above, 10/22: see above for updated scores Goal status: Partially met (see above for updated MMT)  3.  Pt. Able to ambulate 200 feet with mod. I with least assistive device to improve return to home.   Baseline: amb. Short distances with RW/ min. A from PT.  Limited endurance; 10/22: 424 feet with RW during Goal status: GOAL MET (10/22) 4. Pt will increase by at least 44m (143ft) when using LRAD (quad cane) in order to demonstrate clinically significant improvement in cardiopulmonary endurance and community ambulation  Baseline: 140 feet with quad cane  Goal status: initial    PLAN:  PT FREQUENCY: 2x/week  PT DURATION: 6 weeks  PLANNED INTERVENTIONS: Therapeutic exercises, Therapeutic activity, Neuromuscular re-education, Balance training, Gait training, Patient/Family education, Self Care, Joint mobilization, Stair training, Prosthetic training, DME instructions, Manual therapy, and Re-evaluation  PLAN FOR NEXT SESSION: Progress gait training with unilateral UE support as tolerated, progress static/dynamic balance exercises, continue with eccentric quad strengthening, fast-twitch muscle exercises (static repeats with metronome), progress gait training with quad cane as able (emphasize R hip extension and LLE step length),  blaze pods for turns/visual scanning practice with QC, step-taps with QC for HEP, PROGRESS NOTE  Cammie Mcgee, PT, DPT # [phone] Cena Benton, SPT 12/19/22, 1:57 PM

## 2022-12-24 ENCOUNTER — Ambulatory Visit: Payer: Medicare HMO | Admitting: Physical Therapy

## 2022-12-24 ENCOUNTER — Encounter: Payer: Self-pay | Admitting: Physical Therapy

## 2022-12-24 DIAGNOSIS — Z89511 Acquired absence of right leg below knee: Secondary | ICD-10-CM

## 2022-12-24 DIAGNOSIS — M6281 Muscle weakness (generalized): Secondary | ICD-10-CM | POA: Diagnosis not present

## 2022-12-24 DIAGNOSIS — R269 Unspecified abnormalities of gait and mobility: Secondary | ICD-10-CM

## 2022-12-24 NOTE — Therapy (Signed)
OUTPATIENT PHYSICAL THERAPY LOWER EXTREMITY TREATMENT/PROGRESS NOTE 11/15/2022 - 12/24/2022  Patient Name: Melanie Juarez MRN: [id number] DOB:03/20/1945, 77 y.o., female Today's Date: 12/24/2022  END OF SESSION:  PT End of Session - 12/24/22 1315     Visit Number 30    Number of Visits 35    PT Start Time 1300    PT Stop Time 1346    PT Time Calculation (min) 46 min    Equipment Utilized During Treatment Gait belt    Activity Tolerance Patient tolerated treatment well    Behavior During Therapy WFL for tasks assessed/performed              Past Medical History:  Diagnosis Date   Diabetes (HCC)    Hypertension    Past Surgical History:  Procedure Laterality Date   AMPUTATION Right 03/22/2022   Procedure: AMPUTATION BELOW KNEE-Guillotine;  Surgeon: Annice Needy, MD;  Location: [facility];  Service: Vascular;  Laterality: Right;   LOWER EXTREMITY ANGIOGRAPHY Right 03/21/2022   Procedure: Lower Extremity Angiography;  Surgeon: Annice Needy, MD;  Location: [facility];  Service: Cardiovascular;  Laterality: Right;   STUMP REVISION Right 03/26/2022   Procedure: BKA REVISION;  Surgeon: Annice Needy, MD;  Location: [facility] OR[facility];  Service: General;  Laterality: Right;   Patient Active Problem List   Diagnosis Date Noted   Hx of BKA, right (HCC) 08/08/2022   Lactic acidosis 03/21/2022   Obesity (BMI 30-39.9) 03/21/2022   PAD (peripheral artery disease) (HCC) 03/21/2022   Osteomyelitis (HCC) 03/19/2022   Diabetic infection of right foot (HCC) 03/19/2022   Essential hypertension 03/19/2022   Leukocytosis 03/19/2022   AKI (acute kidney injury) (HCC) 03/19/2022   Sepsis with acute renal failure without septic shock (HCC) 03/19/2022   Hyperlipidemia 05/17/2014   Type 2 diabetes mellitus (HCC) 05/17/2014   Anemia, unspecified 05/18/2013   Benign essential hypertension 05/18/2013   REFERRING PROVIDER: Annice Needy, MD  REFERRING DIAG: s/p R BKA  THERAPY DIAG:  Hx of BKA,  right (HCC)  Muscle weakness (generalized)  Gait difficulty  Rationale for Evaluation and Treatment: Rehabilitation  ONSET DATE: 03/22/22  SUBJECTIVE:   SUBJECTIVE STATEMENT: Pt. Had a sore on R foot resulting in R BKA on 03/22/22.  Pt. Entered Pt with use of w/c and daughter Melanie Juarez) present.  Pt. Wants to return to living independently.  Pt. Completing HEP from rehab center.  Pt. Has been unable to get into shower.    PERTINENT HISTORY: Melanie Juarez is a 77 y.o. ([birth year]) female who presents with chief complaint of     Chief Complaint  Patient presents with   Venous Insufficiency  .   History of Present Illness: Patient returns today in follow up of her right BKA.  It is healed.  She is in the process of getting her prosthesis now.  She is doing well.  No pain.  No left leg ulceration or rest pain.    Assessment/Plan   PAD (peripheral artery disease) (HCC) Check left ABI on follow up visit in 3 months.    Type 2 diabetes mellitus (HCC) blood glucose control important in reducing the progression of atherosclerotic disease. Also, involved in wound healing. On appropriate medications.    Essential hypertension blood pressure control important in reducing the progression of atherosclerotic disease. On appropriate oral medications.    Hx of BKA, right (HCC) Healed.  In the process of getting her prosthesis.  A new prescription given for physical  therapy for prosthesis and ambulatory training.   Melanie Barren, MD   08/08/2022 10:42 AM  PAIN:  Are you having pain? No.  Pt. Reports phantom limb symptoms but not pain.    PRECAUTIONS: Fall  RED FLAGS: None   WEIGHT BEARING RESTRICTIONS: No  FALLS:  Has patient fallen in last 6 months? No  LIVING ENVIRONMENT: Lives with: lives with their daughter  Melanie Juarez).   Lives in: House/apartment Stairs: No Has following equipment at home: Dan Humphreys - 2 wheeled, Wheelchair (manual), and Ramped entry  OCCUPATION: Retired  PLOF:  Independent.  Pt. Does not drive.    PATIENT GOALS: Ambulate with least assistive device.  Return home independently/ go back to church.    NEXT MD VISIT: Melanie Juarez 7/31.  MD f/u in September?  OBJECTIVE:   PATIENT SURVEYS:  FOTO initial 29/ goal 90.    COGNITION: Overall cognitive status: Within functional limits for tasks assessed   SENSATION: WFL  EDEMA:  No R residual limb swelling noted.  PT discussed use of shrinker and proper use of ply socks for improved fit of prosthetic leg.    POSTURE: rounded shoulders and forward head  PALPATION: Minimal tenderness along R distal residual limb/ incision.  Good incision healing/ PT discussed benefits of scar massage.    LOWER EXTREMITY ROM:  Active ROM Right eval Left eval  Hip flexion [facility] WFL  Hip extension NT NT  Hip abduction [facility]  Hip adduction    Hip internal rotation    Hip external rotation    Knee flexion [facility] WFL  Knee extension -3 deg. 0 deg.  Ankle dorsiflexion    Ankle plantarflexion    Ankle inversion    Ankle eversion     (Blank rows = not tested)  LOWER EXTREMITY MMT:  MMT Right eval Left eval  Hip flexion 4/5 4/5  Hip extension    Hip abduction 3+/5 4/5  Hip adduction    Hip internal rotation    Hip external rotation    Knee flexion 4+/5  4+/5  Knee extension 4+/5 5/5  Ankle dorsiflexion    Ankle plantarflexion    Ankle inversion    Ankle eversion     (Blank rows = not tested)   B UE AROM WFL.  B UE strength grossly 4/5 MMT except bicep/tricep 4+/5 MMT  FUNCTIONAL TESTS:  5 times sit to stand: TBD Timed up and go (TUG): TBD 6 minute walk test: TBD  GAIT: Distance walked: 45 feet in gym with RW/ min. A for safety and cuing Assistive device utilized: Walker - 2 wheeled Level of assistance: Min A Comments: amb. In gym with use of RW/min A for cuing to increase step length/ heel strike.  Pt. Fearful while walking outside of //-bars.  Pt. Ambs. In //-bars with B UE assist and increase  cadence/ step pattern.  Use of mirror for posture correction/ heel strike (pt. Does not looking at a mirror).     TODAY'S TREATMENT:  DATE: 12/24/2022   Subjective:  Pt. entered PT with use of RW and prosthetic leg donned. No significant changes since last week. Pt reports walking and HEP is still going well.  Treatment:   There.ex.:  : 305 feet with SPC - pt's prosthetic started sliding into extreme ER, time paused briefly half-way through to allow pt time to reapply the leg  FOTO: 60 (goal met)  Nustep level 4 (seat 10) x 10 minutes to improve cardiorespiratory endurance.   Gait Training: Walking in gym with use of QC only and focus on consistent recip. Gait pattern - 1 lap - pt demonstrated improved reciprocal pattern; still requires occasional VC's for increased step length with LLE  Ambulation to car from gym to car with QC and CGA ~ 50 feet  Blaze Pods: home base setting x 2 rounds ambulation with QC and CGA - to improve turns, visual scanning with QC  Blaze pods on 6-inch step: 3 rounds on random setting tapping targets on ground or step using QC    Not today: Fwd 6-inch step-ups: 1x10 each leg with 4# AW, BUE support Standing hip flexion/abduction/extension repeats with 4# AW, 1 minute bouts each direction/leg - increased RLE fatigue at end of exercise Alternating 6 inch step ups with QC and R handrail x 10 on each LE Forward Step-ups onto 6 inch step: 1x8 each leg, single UE support, 5# AW on each leg Ambulation with QC along floor ladder to promote symmetrical step length: 2 laps - Min A occasional to correct loss of balance; pt better able to demonstrate reciprocal pattern towards last few laps  PATIENT EDUCATION:  Education details: Discussed HEP/ standing hip ex. At [employer] Person educated: Patient and Child(ren) Education method:  Medical illustrator Education comprehension: verbalized understanding and returned demonstration  HOME EXERCISE PROGRAM: Access Code: [email or web address] URL: [email or web address] Date: 08/30/2022 Prepared by: Maylon Peppers  Exercises - Sit to Stand with Counter Support  - 1 x daily - 5 x weekly - 3 sets - 10 reps - Standing Hip Abduction with Counter Support  - 1 x daily - 5 x weekly - 3 sets - 10 reps - Standing March with Counter Support  - 1 x daily - 5 x weekly - 3 sets - 10 reps - Standing Hip Extension with Counter Support  - 1 x daily - 5 x weekly - 3 sets - 10 reps  ASSESSMENT:  CLINICAL IMPRESSION:       Patient presents to PT for progress note. Pt demonstrated improvement in distance while using the QC. Her prosthetic limb began sliding into ER during the walk so the time had to be briefly paused to allow her time to reapply the leg. Pt reports she did not wear her usual sock-ply which is why it was sliding today; she will plan to wear the normal amount going forward. Pt's goals updated to reflect her progress and to continue improving her independence with home and community mobility. The rest of the session consisted of blaze pod ambulation activities to improve her dynamic and static stability while using QC. Pt continues to demonstrate improved step length bilaterally while using the QC; she requires occasional cueing to increase LLE step length for improved reciprocal pattern. Pt will continue to benefit from skilled PT services to improve her ambulation and function with using a less restrictive assistive device (quad cane) and return her to her PLOF.  OBJECTIVE IMPAIRMENTS: Abnormal gait, decreased activity tolerance, decreased balance, decreased endurance, decreased  mobility, difficulty walking, decreased ROM, decreased strength, decreased safety awareness, improper body mechanics, postural dysfunction, and pain.   ACTIVITY LIMITATIONS: carrying, lifting,  standing, squatting, stairs, transfers, bed mobility, bathing, toileting, dressing, and locomotion level  PARTICIPATION LIMITATIONS: meal prep, cleaning, laundry, shopping, community activity, and church  PERSONAL FACTORS: Fitness and Past/current experiences are also affecting patient's functional outcome.   REHAB POTENTIAL: Good  CLINICAL DECISION MAKING: Evolving/moderate complexity  EVALUATION COMPLEXITY: Moderate   GOALS: Goals reviewed with patient? Yes  SHORT TERM GOALS: Target date: 09/18/22 Pt. Able to tolerate standing 10 minutes in kitchen with use of prosthetic leg to improve meal preparation.   Baseline:  limited standing tolerance Goal status: Goal met   LONG TERM GOALS: Target date: 12/27/22  Pt. Will increase FOTO to 46 to improve pain-free mobility.   Baseline: initial FOTO 29; 11/11: 60 Goal status: GOAL MET   2.  Pt. Will increase B LE muscle strength 1/2 muscle grade to improve pain-free mobility/ walking.   Baseline:  See above, 10/22: see above for updated scores Goal status: Partially met (see above for updated MMT)  3.  Pt. Able to ambulate 200 feet with mod. I with least assistive device to improve return to home.   Baseline: amb. Short distances with RW/ min. A from PT.  Limited endurance; 10/22: 424 feet with RW during Goal status: GOAL MET (10/22) 4. Pt will increase by at least 22m (123ft) when using LRAD (quad cane) in order to demonstrate clinically significant improvement in cardiopulmonary endurance and community ambulation  Baseline: 140 feet with quad cane; 11/11: 305 feet with quad cane  Goal status: GOAL MET (11/11)  5. Pt will be demonstrate ascending/descending at least 4 stairs with reciprocal pattern to improve independence with community mobility.  Baseline: step-to pattern with BUE assist  Goal status: initial  6. Pt will be able to ambulate at least 500 feet during while using the QC in order to show improvement in  walking tolerance and community mobility.  Baseline: 11/11: 305 feet with QC  Goal status: not met, progressing    PLAN:  PT FREQUENCY: 2x/week  PT DURATION: 6 weeks  PLANNED INTERVENTIONS: Therapeutic exercises, Therapeutic activity, Neuromuscular re-education, Balance training, Gait training, Patient/Family education, Self Care, Joint mobilization, Stair training, Prosthetic training, DME instructions, Manual therapy, and Re-evaluation  PLAN FOR NEXT SESSION: Progress gait training with unilateral UE support as tolerated, progress static/dynamic balance exercises, continue with eccentric quad strengthening, fast-twitch muscle exercises (static repeats with metronome), progress gait training with quad cane as able (emphasize R hip extension and LLE step length), blaze pods for turns/visual scanning practice with QC, step-taps with QC for HEP, assess LE MMT  Cammie Mcgee, PT, DPT # [phone] Cena Benton, SPT 12/24/22, 3:31 PM

## 2022-12-26 ENCOUNTER — Ambulatory Visit: Payer: Medicare HMO | Admitting: Physical Therapy

## 2023-01-04 ENCOUNTER — Encounter: Payer: Self-pay | Admitting: Physical Therapy

## 2023-01-04 ENCOUNTER — Ambulatory Visit: Payer: Medicare HMO | Admitting: Physical Therapy

## 2023-01-04 DIAGNOSIS — R269 Unspecified abnormalities of gait and mobility: Secondary | ICD-10-CM | POA: Diagnosis not present

## 2023-01-04 DIAGNOSIS — M6281 Muscle weakness (generalized): Secondary | ICD-10-CM | POA: Diagnosis not present

## 2023-01-04 DIAGNOSIS — Z89511 Acquired absence of right leg below knee: Secondary | ICD-10-CM

## 2023-01-04 NOTE — Therapy (Signed)
OUTPATIENT PHYSICAL THERAPY LOWER EXTREMITY TREATMENT  Patient Name: Melanie Juarez MRN: [id number] DOB:14-Oct-1945, 77 y.o., female Today's Date: 01/04/2023  END OF SESSION:  PT End of Session - 01/04/23 0830     Visit Number 31    Number of Visits 39    Date for PT Re-Evaluation 02/01/23    PT Start Time 0825    PT Stop Time 0914    PT Time Calculation (min) 49 min    Equipment Utilized During Treatment Gait belt    Activity Tolerance Patient tolerated treatment well    Behavior During Therapy WFL for tasks assessed/performed              Past Medical History:  Diagnosis Date   Diabetes (HCC)    Hypertension    Past Surgical History:  Procedure Laterality Date   AMPUTATION Right 03/22/2022   Procedure: AMPUTATION BELOW KNEE-Guillotine;  Surgeon: Melanie Needy, MD;  Location: [facility];  Service: Vascular;  Laterality: Right;   LOWER EXTREMITY ANGIOGRAPHY Right 03/21/2022   Procedure: Lower Extremity Angiography;  Surgeon: Melanie Needy, MD;  Location: [facility];  Service: Cardiovascular;  Laterality: Right;   STUMP REVISION Right 03/26/2022   Procedure: BKA REVISION;  Surgeon: Melanie Needy, MD;  Location: [facility];  Service: General;  Laterality: Right;   Patient Active Problem List   Diagnosis Date Noted   Hx of BKA, right (HCC) 08/08/2022   Lactic acidosis 03/21/2022   Obesity (BMI 30-39.9) 03/21/2022   PAD (peripheral artery disease) (HCC) 03/21/2022   Osteomyelitis (HCC) 03/19/2022   Diabetic infection of right foot (HCC) 03/19/2022   Essential hypertension 03/19/2022   Leukocytosis 03/19/2022   AKI (acute kidney injury) (HCC) 03/19/2022   Sepsis with acute renal failure without septic shock (HCC) 03/19/2022   Hyperlipidemia 05/17/2014   Type 2 diabetes mellitus (HCC) 05/17/2014   Anemia, unspecified 05/18/2013   Benign essential hypertension 05/18/2013   REFERRING PROVIDER: Annice Needy, MD  REFERRING DIAG: s/p R BKA  THERAPY DIAG:  Hx of BKA,  right (HCC)  Muscle weakness (generalized)  Gait difficulty  Rationale for Evaluation and Treatment: Rehabilitation  ONSET DATE: 03/22/22  SUBJECTIVE:   SUBJECTIVE STATEMENT: Pt. Had a sore on R foot resulting in R BKA on 03/22/22.  Pt. Entered Pt with use of w/c and daughter Melanie Juarez) present.  Pt. Wants to return to living independently.  Pt. Completing HEP from rehab center.  Pt. Has been unable to get into shower.    PERTINENT HISTORY: [name] is a 77 y.o. (09/09/1945) female who presents with chief complaint of     Chief Complaint  Patient presents with   Venous Insufficiency  .   History of Present Illness: Patient returns today in follow up of her right BKA.  It is healed.  She is in the process of getting her prosthesis now.  She is doing well.  No pain.  No left leg ulceration or rest pain.    Assessment/Plan   PAD (peripheral artery disease) (HCC) Check left ABI on follow up visit in 3 months.    Type 2 diabetes mellitus (HCC) blood glucose control important in reducing the progression of atherosclerotic disease. Also, involved in wound healing. On appropriate medications.    Essential hypertension blood pressure control important in reducing the progression of atherosclerotic disease. On appropriate oral medications.    Hx of BKA, right (HCC) Healed.  In the process of getting her prosthesis.  A new  prescription given for physical therapy for prosthesis and ambulatory training.   Melanie Barren, MD   08/08/2022 10:42 AM  PAIN:  Are you having pain? No.  Pt. Reports phantom limb symptoms but not pain.    PRECAUTIONS: Fall  RED FLAGS: None   WEIGHT BEARING RESTRICTIONS: No  FALLS:  Has patient fallen in last 6 months? No  LIVING ENVIRONMENT: Lives with: lives with their daughter  Melanie Juarez).   Lives in: House/apartment Stairs: No Has following equipment at home: Dan Humphreys - 2 wheeled, Wheelchair (manual), and Ramped entry  OCCUPATION: Retired  PLOF:  Independent.  Pt. Does not drive.    PATIENT GOALS: Ambulate with least assistive device.  Return home independently/ go back to church.    NEXT MD VISIT: Melanie Juarez 7/31.  MD f/u in September?  OBJECTIVE:   PATIENT SURVEYS:  FOTO initial 29/ goal 34.    COGNITION: Overall cognitive status: Within functional limits for tasks assessed   SENSATION: WFL  EDEMA:  No R residual limb swelling noted.  PT discussed use of shrinker and proper use of ply socks for improved fit of prosthetic leg.    POSTURE: rounded shoulders and forward head  PALPATION: Minimal tenderness along R distal residual limb/ incision.  Good incision healing/ PT discussed benefits of scar massage.    LOWER EXTREMITY ROM:  Active ROM Right eval Left eval  Hip flexion [facility] WFL  Hip extension NT NT  Hip abduction [facility]  Hip adduction    Hip internal rotation    Hip external rotation    Knee flexion [facility] WFL  Knee extension -3 deg. 0 deg.  Ankle dorsiflexion    Ankle plantarflexion    Ankle inversion    Ankle eversion     (Blank rows = not tested)  LOWER EXTREMITY MMT:  MMT Right eval Left eval  Hip flexion 4/5 4/5  Hip extension    Hip abduction 3+/5 4/5  Hip adduction    Hip internal rotation    Hip external rotation    Knee flexion 4+/5  4+/5  Knee extension 4+/5 5/5  Ankle dorsiflexion    Ankle plantarflexion    Ankle inversion    Ankle eversion     (Blank rows = not tested)   B UE AROM WFL.  B UE strength grossly 4/5 MMT except bicep/tricep 4+/5 MMT  FUNCTIONAL TESTS:  5 times sit to stand: TBD Timed up and go (TUG): TBD 6 minute walk test: TBD  GAIT: Distance walked: 45 feet in gym with RW/ min. A for safety and cuing Assistive device utilized: Walker - 2 wheeled Level of assistance: Min A Comments: amb. In gym with use of RW/min A for cuing to increase step length/ heel strike.  Pt. Fearful while walking outside of //-bars.  Pt. Ambs. In //-bars with B UE assist and increase  cadence/ step pattern.  Use of mirror for posture correction/ heel strike (pt. Does not looking at a mirror).    : 305 feet with SPC - pt's prosthetic started sliding into extreme ER, time paused briefly half-way through to allow pt time to reapply the leg  FOTO: 60 (goal met)  TODAY'S TREATMENT:  DATE: 01/04/2023   Subjective:  Pt. entered PT with use of small base QC and prosthetic leg donned. No significant changes since last week. Pt reports walking and HEP is still going well.  Pt. Is planning to get QC soon.  PT discussed walking with use of QC at home with mod. I.   Treatment:   There.ex.:   Nustep level 4 (seat 10) x 10 minutes to improve cardiorespiratory endurance.  Discussed daily tasks/ HEP  Neuro mm.:  Walking in hallway with QC with focus on recip. Gait pattern/ heel strike and arm swing.  //-bars (forward/backwards/lateral)- 4 laps each (light to no UE assit)/ walking over [employer] pads with focus on balance/ safety.    Walking in gym with use of QC only and focus on consistent recip. Gait pattern - 1 lap - pt demonstrated improved reciprocal pattern; still requires occasional VC's for increased step length with LLE  Standing marching/ wt. Shifting with no UE assist and mirror feedback for posture correction.    Ascend/descend stairs with recip. Pattern and 1 UE assist 4 steps x 4.    Ambulation to car from gym to car with QC and CGA ~ 50 feet    Not today: Fwd 6-inch step-ups: 1x10 each leg with 4# AW, BUE support Standing hip flexion/abduction/extension repeats with 4# AW, 1 minute bouts each direction/leg - increased RLE fatigue at end of exercise Alternating 6 inch step ups with QC and R handrail x 10 on each LE Forward Step-ups onto 6 inch step: 1x8 each leg, single UE support, 5# AW on each leg Ambulation with QC along floor ladder to  promote symmetrical step length: 2 laps - Min A occasional to correct loss of balance; pt better able to demonstrate reciprocal pattern towards last few laps  PATIENT EDUCATION:  Education details: Discussed HEP/ standing hip ex. At [employer] Person educated: Patient and Child(ren) Education method: Medical illustrator Education comprehension: verbalized understanding and returned demonstration  HOME EXERCISE PROGRAM: Access Code: [email or web address] URL: [email or web address] Date: 08/30/2022 Prepared by: Maylon Peppers  Exercises - Sit to Stand with Counter Support  - 1 x daily - 5 x weekly - 3 sets - 10 reps - Standing Hip Abduction with Counter Support  - 1 x daily - 5 x weekly - 3 sets - 10 reps - Standing March with Counter Support  - 1 x daily - 5 x weekly - 3 sets - 10 reps - Standing Hip Extension with Counter Support  - 1 x daily - 5 x weekly - 3 sets - 10 reps  ASSESSMENT:  CLINICAL IMPRESSION:       Patient presents to PT ready to focus on LE strengthening and balance/ gait training. Pts. Prosthetic leg fitting better today but pts. Dtr. Instructed to bring ply sock bag next tx. Session.  Pt continues to demonstrate improved step length bilaterally while using the QC; she requires occasional cueing to increase LLE step length for improved reciprocal pattern. Pt will continue to benefit from skilled PT services to improve her ambulation and function with using a less restrictive assistive device (quad cane) and return her to her PLOF.  OBJECTIVE IMPAIRMENTS: Abnormal gait, decreased activity tolerance, decreased balance, decreased endurance, decreased mobility, difficulty walking, decreased ROM, decreased strength, decreased safety awareness, improper body mechanics, postural dysfunction, and pain.   ACTIVITY LIMITATIONS: carrying, lifting, standing, squatting, stairs, transfers, bed mobility, bathing, toileting, dressing, and locomotion level  PARTICIPATION  LIMITATIONS: meal prep, cleaning, laundry, shopping, community activity,  and church  PERSONAL FACTORS: Fitness and Past/current experiences are also affecting patient's functional outcome.   REHAB POTENTIAL: Good  CLINICAL DECISION MAKING: Evolving/moderate complexity  EVALUATION COMPLEXITY: Moderate   GOALS: Goals reviewed with patient? Yes  SHORT TERM GOALS: Target date: 09/18/22 Pt. Able to tolerate standing 10 minutes in kitchen with use of prosthetic leg to improve meal preparation.   Baseline:  limited standing tolerance Goal status: Goal met   LONG TERM GOALS: Target date: 12/27/22  Pt. Will increase FOTO to 46 to improve pain-free mobility.   Baseline: initial FOTO 29; 11/11: 60 Goal status: GOAL MET   2.  Pt. Will increase B LE muscle strength 1/2 muscle grade to improve pain-free mobility/ walking.   Baseline:  See above, 10/22: see above for updated scores Goal status: Partially met (see above for updated MMT)  3.  Pt. Able to ambulate 200 feet with mod. I with least assistive device to improve return to home.   Baseline: amb. Short distances with RW/ min. A from PT.  Limited endurance; 10/22: 424 feet with RW during Goal status: GOAL MET (10/22) 4. Pt will increase by at least 52m (189ft) when using LRAD (quad cane) in order to demonstrate clinically significant improvement in cardiopulmonary endurance and community ambulation  Baseline: 140 feet with quad cane; 11/11: 305 feet with quad cane  Goal status: GOAL MET (11/11)  5. Pt will be demonstrate ascending/descending at least 4 stairs with reciprocal pattern to improve independence with community mobility.  Baseline: step-to pattern with BUE assist  Goal status: initial  6. Pt will be able to ambulate at least 500 feet during while using the QC in order to show improvement in walking tolerance and community mobility.  Baseline: 11/11: 305 feet with QC  Goal status: not met,  progressing    PLAN:  PT FREQUENCY: 2x/week  PT DURATION: 6 weeks  PLANNED INTERVENTIONS: Therapeutic exercises, Therapeutic activity, Neuromuscular re-education, Balance training, Gait training, Patient/Family education, Self Care, Joint mobilization, Stair training, Prosthetic training, DME instructions, Manual therapy, and Re-evaluation  PLAN FOR NEXT SESSION: Progress gait training with unilateral UE support as tolerated, progress static/dynamic balance exercises, continue with eccentric quad strengthening, fast-twitch muscle exercises (static repeats with metronome), progress gait training with quad cane as able (emphasize R hip extension and LLE step length), blaze pods for turns/visual scanning practice with QC, step-taps with QC for HEP, assess LE MMT  Cammie Mcgee, PT, DPT # [phone] 01/04/23, 10:17 AM

## 2023-01-07 ENCOUNTER — Encounter: Payer: Self-pay | Admitting: Physical Therapy

## 2023-01-07 ENCOUNTER — Ambulatory Visit: Payer: Medicare HMO | Admitting: Physical Therapy

## 2023-01-07 DIAGNOSIS — R269 Unspecified abnormalities of gait and mobility: Secondary | ICD-10-CM | POA: Diagnosis not present

## 2023-01-07 DIAGNOSIS — M6281 Muscle weakness (generalized): Secondary | ICD-10-CM | POA: Diagnosis not present

## 2023-01-07 DIAGNOSIS — E113591 Type 2 diabetes mellitus with proliferative diabetic retinopathy without macular edema, right eye: Secondary | ICD-10-CM | POA: Diagnosis not present

## 2023-01-07 DIAGNOSIS — Z89511 Acquired absence of right leg below knee: Secondary | ICD-10-CM | POA: Diagnosis not present

## 2023-01-07 NOTE — Therapy (Signed)
OUTPATIENT PHYSICAL THERAPY LOWER EXTREMITY TREATMENT  Patient Name: Melanie Juarez MRN: [id number] DOB:1945-07-26, 77 y.o., female Today's Date: 01/07/2023  END OF SESSION:  PT End of Session - 01/07/23 0827     Visit Number 32    Number of Visits 39    Date for PT Re-Evaluation 02/01/23    PT Start Time 0816    PT Stop Time 0903    PT Time Calculation (min) 47 min    Equipment Utilized During Treatment Gait belt    Activity Tolerance Patient tolerated treatment well    Behavior During Therapy [facility] for tasks assessed/performed             Past Medical History:  Diagnosis Date   Diabetes (HCC)    Hypertension    Past Surgical History:  Procedure Laterality Date   AMPUTATION Right 03/22/2022   Procedure: AMPUTATION BELOW KNEE-Guillotine;  Surgeon: Melanie Needy, MD;  Location: [facility];  Service: Vascular;  Laterality: Right;   LOWER EXTREMITY ANGIOGRAPHY Right 03/21/2022   Procedure: Lower Extremity Angiography;  Surgeon: Melanie Needy, MD;  Location: [facility];  Service: Cardiovascular;  Laterality: Right;   STUMP REVISION Right 03/26/2022   Procedure: BKA REVISION;  Surgeon: Melanie Needy, MD;  Location: [facility];  Service: General;  Laterality: Right;   Patient Active Problem List   Diagnosis Date Noted   Hx of BKA, right (HCC) 08/08/2022   Lactic acidosis 03/21/2022   Obesity (BMI 30-39.9) 03/21/2022   PAD (peripheral artery disease) (HCC) 03/21/2022   Osteomyelitis (HCC) 03/19/2022   Diabetic infection of right foot (HCC) 03/19/2022   Essential hypertension 03/19/2022   Leukocytosis 03/19/2022   AKI (acute kidney injury) (HCC) 03/19/2022   Sepsis with acute renal failure without septic shock (HCC) 03/19/2022   Hyperlipidemia 05/17/2014   Type 2 diabetes mellitus (HCC) 05/17/2014   Anemia, unspecified 05/18/2013   Benign essential hypertension 05/18/2013   REFERRING PROVIDER: Annice Needy, MD  REFERRING DIAG: s/p R BKA  THERAPY DIAG:  Hx of BKA, right  (HCC)  Muscle weakness (generalized)  Gait difficulty  Rationale for Evaluation and Treatment: Rehabilitation  ONSET DATE: 03/22/22  SUBJECTIVE:   SUBJECTIVE STATEMENT: Pt. Had a sore on R foot resulting in R BKA on 03/22/22.  Pt. Entered Pt with use of w/c and daughter Melanie Juarez) present.  Pt. Wants to return to living independently.  Pt. Completing HEP from rehab center.  Pt. Has been unable to get into shower.    PERTINENT HISTORY: [name] is a 77 y.o. ([birth year]) female who presents with chief complaint of     Chief Complaint  Patient presents with   Venous Insufficiency  .   History of Present Illness: Patient returns today in follow up of her right BKA.  It is healed.  She is in the process of getting her prosthesis now.  She is doing well.  No pain.  No left leg ulceration or rest pain.    Assessment/Plan   PAD (peripheral artery disease) (HCC) Check left ABI on follow up visit in 3 months.    Type 2 diabetes mellitus (HCC) blood glucose control important in reducing the progression of atherosclerotic disease. Also, involved in wound healing. On appropriate medications.    Essential hypertension blood pressure control important in reducing the progression of atherosclerotic disease. On appropriate oral medications.    Hx of BKA, right (HCC) Healed.  In the process of getting her prosthesis.  A new prescription  given for physical therapy for prosthesis and ambulatory training.   Melanie Barren, MD   08/08/2022 10:42 AM  PAIN:  Are you having pain? No.  Pt. Reports phantom limb symptoms but not pain.    PRECAUTIONS: Fall  RED FLAGS: None   WEIGHT BEARING RESTRICTIONS: No  FALLS:  Has patient fallen in last 6 months? No  LIVING ENVIRONMENT: Lives with: lives with their daughter  Melanie Juarez).   Lives in: House/apartment Stairs: No Has following equipment at home: Dan Humphreys - 2 wheeled, Wheelchair (manual), and Ramped entry  OCCUPATION: Retired  PLOF:  Independent.  Pt. Does not drive.    PATIENT GOALS: Ambulate with least assistive device.  Return home independently/ go back to church.    NEXT MD VISIT: Melanie Juarez 7/31.  MD f/u in September?  OBJECTIVE:   PATIENT SURVEYS:  FOTO initial 29/ goal 62.    COGNITION: Overall cognitive status: Within functional limits for tasks assessed   SENSATION: WFL  EDEMA:  No R residual limb swelling noted.  PT discussed use of shrinker and proper use of ply socks for improved fit of prosthetic leg.    POSTURE: rounded shoulders and forward head  PALPATION: Minimal tenderness along R distal residual limb/ incision.  Good incision healing/ PT discussed benefits of scar massage.    LOWER EXTREMITY ROM:  Active ROM Right eval Left eval  Hip flexion [facility] WFL  Hip extension NT NT  Hip abduction [facility]  Hip adduction    Hip internal rotation    Hip external rotation    Knee flexion [facility] WFL  Knee extension -3 deg. 0 deg.  Ankle dorsiflexion    Ankle plantarflexion    Ankle inversion    Ankle eversion     (Blank rows = not tested)  LOWER EXTREMITY MMT:  MMT Right eval Left eval  Hip flexion 4/5 4/5  Hip extension    Hip abduction 3+/5 4/5  Hip adduction    Hip internal rotation    Hip external rotation    Knee flexion 4+/5  4+/5  Knee extension 4+/5 5/5  Ankle dorsiflexion    Ankle plantarflexion    Ankle inversion    Ankle eversion     (Blank rows = not tested)   B UE AROM WFL.  B UE strength grossly 4/5 MMT except bicep/tricep 4+/5 MMT  FUNCTIONAL TESTS:  5 times sit to stand: TBD Timed up and go (TUG): TBD 6 minute walk test: TBD  GAIT: Distance walked: 45 feet in gym with RW/ min. A for safety and cuing Assistive device utilized: Walker - 2 wheeled Level of assistance: Min A Comments: amb. In gym with use of RW/min A for cuing to increase step length/ heel strike.  Pt. Fearful while walking outside of //-bars.  Pt. Ambs. In //-bars with B UE assist and increase  cadence/ step pattern.  Use of mirror for posture correction/ heel strike (pt. Does not looking at a mirror).    : 305 feet with SPC - pt's prosthetic started sliding into extreme ER, time paused briefly half-way through to allow pt time to reapply the leg  FOTO: 60 (goal met)  TODAY'S TREATMENT:  DATE: 01/07/2023   Subjective:  Pt. entered PT with use of small base QC and prosthetic leg donned. Pt. Brought in ply sock bag and has ordered QC for home use.    Treatment:   There.ex.:   Nustep level 4 (seat 10) x 8 minutes to improve cardiorespiratory endurance.  Discussed weekend activities.  No issues.  Neuro mm.:  Walking in //-bars (forward/backwards/lateral)- 4 laps each (light to no UE assit)/ walking over Airex pads and hurdles with focus on balance/ safety.  2 episodes of R toe clipping the hurdles during step pattern.  No LOB and able to progress to use of L UE assist only during hurdles.   Walking in gym with use of QC only and focus on consistent recip. Gait pattern - 1 lap - pt demonstrated improved reciprocal pattern; still requires occasional VC's for increased step length with LLE  Standing marching/ wt. Shifting with no UE assist and mirror feedback for posture correction.    Sit to stands from gray chair/ Nustep with light UE assist and proper technique.  Good wt. Shift to prosthetic leg.    Ambulation to car from gym to car with QC and SBA/mod. I.      Not today: Fwd 6-inch step-ups: 1x10 each leg with 4# AW, BUE support Standing hip flexion/abduction/extension repeats with 4# AW, 1 minute bouts each direction/leg - increased RLE fatigue at end of exercise Alternating 6 inch step ups with QC and R handrail x 10 on each LE Forward Step-ups onto 6 inch step: 1x8 each leg, single UE support, 5# AW on each leg Ambulation with QC along floor  ladder to promote symmetrical step length: 2 laps - Min A occasional to correct loss of balance; pt better able to demonstrate reciprocal pattern towards last few laps  PATIENT EDUCATION:  Education details: Discussed HEP/ standing hip ex. At [employer] Person educated: Patient and Child(ren) Education method: Medical illustrator Education comprehension: verbalized understanding and returned demonstration  HOME EXERCISE PROGRAM: Access Code: [email or web address] URL: [email or web address] Date: 08/30/2022 Prepared by: Maylon Peppers  Exercises - Sit to Stand with Counter Support  - 1 x daily - 5 x weekly - 3 sets - 10 reps - Standing Hip Abduction with Counter Support  - 1 x daily - 5 x weekly - 3 sets - 10 reps - Standing March with Counter Support  - 1 x daily - 5 x weekly - 3 sets - 10 reps - Standing Hip Extension with Counter Support  - 1 x daily - 5 x weekly - 3 sets - 10 reps  ASSESSMENT:  CLINICAL IMPRESSION:       Patient presents to PT ready to focus on LE strengthening and balance/ gait training. Pts. Prosthetic leg fitting better today and Pt adjusted ply socks to 5 ply/3 ply socks.  Pt continues to demonstrate improved step length bilaterally while using the QC; she requires occasional cueing to increase LLE step length for improved reciprocal pattern. Pt will continue to benefit from skilled PT services to improve her ambulation and function with using a less restrictive assistive device (quad cane) and return her to her PLOF.  OBJECTIVE IMPAIRMENTS: Abnormal gait, decreased activity tolerance, decreased balance, decreased endurance, decreased mobility, difficulty walking, decreased ROM, decreased strength, decreased safety awareness, improper body mechanics, postural dysfunction, and pain.   ACTIVITY LIMITATIONS: carrying, lifting, standing, squatting, stairs, transfers, bed mobility, bathing, toileting, dressing, and locomotion level  PARTICIPATION  LIMITATIONS: meal prep, cleaning, laundry, shopping, community activity,  and church  PERSONAL FACTORS: Fitness and Past/current experiences are also affecting patient's functional outcome.   REHAB POTENTIAL: Good  CLINICAL DECISION MAKING: Evolving/moderate complexity  EVALUATION COMPLEXITY: Moderate   GOALS: Goals reviewed with patient? Yes  LONG TERM GOALS: Target date: 02/01/23  Pt. Will increase FOTO to 46 to improve pain-free mobility.   Baseline: initial FOTO 29; 11/11: 60 Goal status: GOAL MET   2.  Pt. Will increase B LE muscle strength 1/2 muscle grade to improve pain-free mobility/ walking.   Baseline:  See above, 10/22: see above for updated scores Goal status: Partially met (see above for updated MMT)  3.  Pt. Able to ambulate 200 feet with mod. I with least assistive device to improve return to home.   Baseline: amb. Short distances with RW/ min. A from PT.  Limited endurance; 10/22: 424 feet with RW during Goal status: GOAL MET (10/22) 4. Pt will increase by at least 56m (136ft) when using LRAD (quad cane) in order to demonstrate clinically significant improvement in cardiopulmonary endurance and community ambulation  Baseline: 140 feet with quad cane; 11/11: 305 feet with quad cane  Goal status: GOAL MET (11/11)  5. Pt will be demonstrate ascending/descending at least 4 stairs with reciprocal pattern to improve independence with community mobility.  Baseline: step-to pattern with BUE assist  Goal status: initial  6. Pt will be able to ambulate at least 500 feet during while using the QC in order to show improvement in walking tolerance and community mobility.  Baseline: 11/11: 305 feet with QC  Goal status: not met, progressing    PLAN:  PT FREQUENCY: 2x/week  PT DURATION: 6 weeks  PLANNED INTERVENTIONS: Therapeutic exercises, Therapeutic activity, Neuromuscular re-education, Balance training, Gait training, Patient/Family education, Self  Care, Joint mobilization, Stair training, Prosthetic training, DME instructions, Manual therapy, and Re-evaluation  PLAN FOR NEXT SESSION: Progress gait training to use of QC with mod. I.  LE strengthening/ balance training.    Cammie Mcgee, PT, DPT # [phone] 01/07/23, 4:56 PM

## 2023-01-15 ENCOUNTER — Ambulatory Visit: Payer: Medicare HMO | Admitting: Physical Therapy

## 2023-01-15 NOTE — Therapy (Incomplete)
OUTPATIENT PHYSICAL THERAPY LOWER EXTREMITY TREATMENT  Patient Name: Melanie Juarez MRN: [id number] DOB:1945-07-16, 77 y.o., female Today's Date: 01/15/2023  END OF SESSION:    Past Medical History:  Diagnosis Date   Diabetes (HCC)    Hypertension    Past Surgical History:  Procedure Laterality Date   AMPUTATION Right 03/22/2022   Procedure: AMPUTATION BELOW KNEE-Guillotine;  Surgeon: Annice Needy, MD;  Location: [facility];  Service: Vascular;  Laterality: Right;   LOWER EXTREMITY ANGIOGRAPHY Right 03/21/2022   Procedure: Lower Extremity Angiography;  Surgeon: Annice Needy, MD;  Location: [facility];  Service: Cardiovascular;  Laterality: Right;   STUMP REVISION Right 03/26/2022   Procedure: BKA REVISION;  Surgeon: Annice Needy, MD;  Location: [facility];  Service: General;  Laterality: Right;   Patient Active Problem List   Diagnosis Date Noted   Hx of BKA, right (HCC) 08/08/2022   Lactic acidosis 03/21/2022   Obesity (BMI 30-39.9) 03/21/2022   PAD (peripheral artery disease) (HCC) 03/21/2022   Osteomyelitis (HCC) 03/19/2022   Diabetic infection of right foot (HCC) 03/19/2022   Essential hypertension 03/19/2022   Leukocytosis 03/19/2022   AKI (acute kidney injury) (HCC) 03/19/2022   Sepsis with acute renal failure without septic shock (HCC) 03/19/2022   Hyperlipidemia 05/17/2014   Type 2 diabetes mellitus (HCC) 05/17/2014   Anemia, unspecified 05/18/2013   Benign essential hypertension 05/18/2013   REFERRING PROVIDER: Annice Needy, MD  REFERRING DIAG: s/p R BKA  THERAPY DIAG:  Hx of BKA, right (HCC)  Muscle weakness (generalized)  Gait difficulty  Rationale for Evaluation and Treatment: Rehabilitation  ONSET DATE: 03/22/22  SUBJECTIVE:   SUBJECTIVE STATEMENT: Pt. Had a sore on R foot resulting in R BKA on 03/22/22.  [name]  Pt. Wants to return to living independently.  Pt. Completing HEP from rehab center.   Pt. Has been unable to get into shower.    PERTINENT HISTORY: Melanie Juarez is a 77 y.o. (1945-04-09) female who presents with chief complaint of     Chief Complaint  Patient presents with   Venous Insufficiency  .   History of Present Illness: Patient returns today in follow up of her right BKA.  It is healed.  She is in the process of getting her prosthesis now.  She is doing well.  No pain.  No left leg ulceration or rest pain.    Assessment/Plan   PAD (peripheral artery disease) (HCC) Check left ABI on follow up visit in 3 months.    Type 2 diabetes mellitus (HCC) blood glucose control important in reducing the progression of atherosclerotic disease. Also, involved in wound healing. On appropriate medications.    Essential hypertension blood pressure control important in reducing the progression of atherosclerotic disease. On appropriate oral medications.    Hx of BKA, right (HCC) Healed.  In the process of getting her prosthesis.  A new prescription given for physical therapy for prosthesis and ambulatory training.   [name], MD   08/08/2022 10:42 AM  PAIN:  Are you having pain? No.  Pt. Reports phantom limb symptoms but not pain.    PRECAUTIONS: Fall  RED FLAGS: None   WEIGHT BEARING RESTRICTIONS: No  FALLS:  Has patient fallen in last 6 months? No  LIVING ENVIRONMENT: Lives with: lives with their daughter  [name].   Lives in: House/apartment Stairs: No Has following equipment at home: Dan Humphreys - 2 wheeled, Wheelchair (  manual), and Ramped entry  OCCUPATION: Retired  PLOF: Independent.  Pt. Does not drive.    PATIENT GOALS: Ambulate with least assistive device.  Return home independently/ go back to church.    NEXT MD VISIT: [name] 7/31.  MD f/u in September?  OBJECTIVE:   PATIENT SURVEYS:  FOTO initial 29/ goal 5.    COGNITION: Overall cognitive status: Within functional limits for tasks assessed   SENSATION: WFL  EDEMA:  No R residual  limb swelling noted.  PT discussed use of shrinker and proper use of ply socks for improved fit of prosthetic leg.    POSTURE: rounded shoulders and forward head  PALPATION: Minimal tenderness along R distal residual limb/ incision.  Good incision healing/ PT discussed benefits of scar massage.    LOWER EXTREMITY ROM:  Active ROM Right eval Left eval  Hip flexion [facility] WFL  Hip extension NT NT  Hip abduction [facility]  Hip adduction    Hip internal rotation    Hip external rotation    Knee flexion [facility] WFL  Knee extension -3 deg. 0 deg.  Ankle dorsiflexion    Ankle plantarflexion    Ankle inversion    Ankle eversion     (Blank rows = not tested)  LOWER EXTREMITY MMT:  MMT Right eval Left eval  Hip flexion 4/5 4/5  Hip extension    Hip abduction 3+/5 4/5  Hip adduction    Hip internal rotation    Hip external rotation    Knee flexion 4+/5  4+/5  Knee extension 4+/5 5/5  Ankle dorsiflexion    Ankle plantarflexion    Ankle inversion    Ankle eversion     (Blank rows = not tested)   B UE AROM WFL.  B UE strength grossly 4/5 MMT except bicep/tricep 4+/5 MMT  FUNCTIONAL TESTS:  5 times sit to stand: TBD Timed up and go (TUG): TBD 6 minute walk test: TBD  GAIT: Distance walked: 45 feet in gym with RW/ min. A for safety and cuing Assistive device utilized: Jamarius Saha - 2 wheeled Level of assistance: Min A Comments: amb. In gym with use of RW/min A for cuing to increase step length/ heel strike.  Pt. Fearful while walking outside of //-bars.  Pt. Ambs. In //-bars with B UE assist and increase cadence/ step pattern.  Use of mirror for posture correction/ heel strike (pt. Does not looking at a mirror).    : 305 feet with SPC - pt's prosthetic started sliding into extreme ER, time paused briefly half-way through to allow pt time to reapply the leg  FOTO: 60 (goal met)  TODAY'S TREATMENT:                                                                                                                               DATE: 01/15/2023  *** Subjective:  Pt. entered PT with use of small base QC and prosthetic leg donned.  Pt. Brought in ply sock bag and has ordered QC for home use.    Treatment:   There.ex.:   Nustep level 4 (seat 10) x 8 minutes to improve cardiorespiratory endurance.  Discussed weekend activities.  No issues.  Neuro mm.:  Walking in //-bars (forward/backwards/lateral)- 4 laps each (light to no UE assit)/ walking over Airex pads and hurdles with focus on balance/ safety.  2 episodes of R toe clipping the hurdles during step pattern.  No LOB and able to progress to use of L UE assist only during hurdles.   Walking in gym with use of QC only and focus on consistent recip. Gait pattern - 1 lap - pt demonstrated improved reciprocal pattern; still requires occasional VC's for increased step length with LLE  Standing marching/ wt. Shifting with no UE assist and mirror feedback for posture correction.    Sit to stands from gray chair/ Nustep with light UE assist and proper technique.  Good wt. Shift to prosthetic leg.    Ambulation to car from gym to car with QC and SBA/mod. I.      Not today: Fwd 6-inch step-ups: 1x10 each leg with 4# AW, BUE support Standing hip flexion/abduction/extension repeats with 4# AW, 1 minute bouts each direction/leg - increased RLE fatigue at end of exercise Alternating 6 inch step ups with QC and R handrail x 10 on each LE Forward Step-ups onto 6 inch step: 1x8 each leg, single UE support, 5# AW on each leg Ambulation with QC along floor ladder to promote symmetrical step length: 2 laps - Min A occasional to correct loss of balance; pt better able to demonstrate reciprocal pattern towards last few laps  PATIENT EDUCATION:  Education details: Discussed HEP/ standing hip ex. At [employer] Person educated: Patient and Child(ren) Education method: Medical illustrator Education comprehension: verbalized  understanding and returned demonstration  HOME EXERCISE PROGRAM: Access Code: [email or web address] URL: [email or web address] Date: 08/30/2022 Prepared by: Maylon Peppers  Exercises - Sit to Stand with Counter Support  - 1 x daily - 5 x weekly - 3 sets - 10 reps - Standing Hip Abduction with Counter Support  - 1 x daily - 5 x weekly - 3 sets - 10 reps - Standing March with Counter Support  - 1 x daily - 5 x weekly - 3 sets - 10 reps - Standing Hip Extension with Counter Support  - 1 x daily - 5 x weekly - 3 sets - 10 reps  ASSESSMENT:  CLINICAL IMPRESSION:     ***   Patient presents to PT ready to focus on LE strengthening and balance/ gait training. Pts. Prosthetic leg fitting better today and Pt adjusted ply socks to 5 ply/3 ply socks.  Pt continues to demonstrate improved step length bilaterally while using the QC; she requires occasional cueing to increase LLE step length for improved reciprocal pattern. Pt will continue to benefit from skilled PT services to improve her ambulation and function with using a less restrictive assistive device (quad cane) and return her to her PLOF.  OBJECTIVE IMPAIRMENTS: Abnormal gait, decreased activity tolerance, decreased balance, decreased endurance, decreased mobility, difficulty walking, decreased ROM, decreased strength, decreased safety awareness, improper body mechanics, postural dysfunction, and pain.   ACTIVITY LIMITATIONS: carrying, lifting, standing, squatting, stairs, transfers, bed mobility, bathing, toileting, dressing, and locomotion level  PARTICIPATION LIMITATIONS: meal prep, cleaning, laundry, shopping, community activity, and church  PERSONAL FACTORS: Fitness and Past/current experiences are also affecting patient's functional outcome.   REHAB POTENTIAL:  Good  CLINICAL DECISION MAKING: Evolving/moderate complexity  EVALUATION COMPLEXITY: Moderate   GOALS: Goals reviewed with patient? Yes  LONG TERM GOALS: Target date:  02/01/23  Pt. Will increase FOTO to 46 to improve pain-free mobility.   Baseline: initial FOTO 29; 11/11: 60 Goal status: GOAL MET   2.  Pt. Will increase B LE muscle strength 1/2 muscle grade to improve pain-free mobility/ walking.   Baseline:  See above, 10/22: see above for updated scores Goal status: Partially met (see above for updated MMT)  3.  Pt. Able to ambulate 200 feet with mod. I with least assistive device to improve return to home.   Baseline: amb. Short distances with RW/ min. A from PT.  Limited endurance; 10/22: 424 feet with RW during Goal status: GOAL MET (10/22) 4. Pt will increase by at least 62m (151ft) when using LRAD (quad cane) in order to demonstrate clinically significant improvement in cardiopulmonary endurance and community ambulation  Baseline: 140 feet with quad cane; 11/11: 305 feet with quad cane  Goal status: GOAL MET (11/11)  5. Pt will be demonstrate ascending/descending at least 4 stairs with reciprocal pattern to improve independence with community mobility.  Baseline: step-to pattern with BUE assist  Goal status: initial  6. Pt will be able to ambulate at least 500 feet during while using the QC in order to show improvement in walking tolerance and community mobility.  Baseline: 11/11: 305 feet with QC  Goal status: not met, progressing    PLAN:  PT FREQUENCY: 2x/week  PT DURATION: 6 weeks  PLANNED INTERVENTIONS: Therapeutic exercises, Therapeutic activity, Neuromuscular re-education, Balance training, Gait training, Patient/Family education, Self Care, Joint mobilization, Stair training, Prosthetic training, DME instructions, Manual therapy, and Re-evaluation  PLAN FOR NEXT SESSION: Progress gait training to use of QC with mod. I.  LE strengthening/ balance training.    Maylon Peppers, PT, DPT Physical Therapist - [facility] 01/15/23, 8:08 AM

## 2023-01-17 ENCOUNTER — Ambulatory Visit: Payer: Medicare HMO | Admitting: Physical Therapy

## 2023-01-22 ENCOUNTER — Ambulatory Visit: Payer: Medicare HMO | Attending: Vascular Surgery

## 2023-01-22 DIAGNOSIS — R2681 Unsteadiness on feet: Secondary | ICD-10-CM | POA: Insufficient documentation

## 2023-01-22 DIAGNOSIS — Z89511 Acquired absence of right leg below knee: Secondary | ICD-10-CM | POA: Insufficient documentation

## 2023-01-22 DIAGNOSIS — M6281 Muscle weakness (generalized): Secondary | ICD-10-CM | POA: Insufficient documentation

## 2023-01-22 DIAGNOSIS — R269 Unspecified abnormalities of gait and mobility: Secondary | ICD-10-CM | POA: Insufficient documentation

## 2023-01-22 DIAGNOSIS — R262 Difficulty in walking, not elsewhere classified: Secondary | ICD-10-CM | POA: Insufficient documentation

## 2023-01-22 NOTE — Therapy (Signed)
OUTPATIENT PHYSICAL THERAPY LOWER EXTREMITY TREATMENT  Patient Name: Melanie Juarez MRN: [id number] DOB:Oct 07, 1945, 77 y.o., female Today's Date: 01/22/2023  END OF SESSION:  PT End of Session - 01/22/23 1421     Visit Number 33    Number of Visits 39    Date for PT Re-Evaluation 02/01/23    PT Start Time 1309    PT Stop Time 1345    PT Time Calculation (min) 36 min    Equipment Utilized During Treatment Gait belt    Activity Tolerance Patient tolerated treatment well    Behavior During Therapy WFL for tasks assessed/performed              Past Medical History:  Diagnosis Date   Diabetes (HCC)    Hypertension    Past Surgical History:  Procedure Laterality Date   AMPUTATION Right 03/22/2022   Procedure: AMPUTATION BELOW KNEE-Guillotine;  Surgeon: Annice Needy, MD;  Location: [facility];  Service: Vascular;  Laterality: Right;   LOWER EXTREMITY ANGIOGRAPHY Right 03/21/2022   Procedure: Lower Extremity Angiography;  Surgeon: Annice Needy, MD;  Location: [facility];  Service: Cardiovascular;  Laterality: Right;   STUMP REVISION Right 03/26/2022   Procedure: BKA REVISION;  Surgeon: Annice Needy, MD;  Location: [facility];  Service: General;  Laterality: Right;   Patient Active Problem List   Diagnosis Date Noted   Hx of BKA, right (HCC) 08/08/2022   Lactic acidosis 03/21/2022   Obesity (BMI 30-39.9) 03/21/2022   PAD (peripheral artery disease) (HCC) 03/21/2022   Osteomyelitis (HCC) 03/19/2022   Diabetic infection of right foot (HCC) 03/19/2022   Essential hypertension 03/19/2022   Leukocytosis 03/19/2022   AKI (acute kidney injury) (HCC) 03/19/2022   Sepsis with acute renal failure without septic shock (HCC) 03/19/2022   Hyperlipidemia 05/17/2014   Type 2 diabetes mellitus (HCC) 05/17/2014   Anemia, unspecified 05/18/2013   Benign essential hypertension 05/18/2013   REFERRING PROVIDER: Annice Needy, MD  REFERRING DIAG: s/p R BKA  THERAPY DIAG:  Difficulty  in walking, not elsewhere classified  Muscle weakness (generalized)  Unsteadiness on feet  Hx of BKA, right (HCC)  Gait difficulty  Rationale for Evaluation and Treatment: Rehabilitation  ONSET DATE: 03/22/22  SUBJECTIVE:   SUBJECTIVE STATEMENT: Pt. Had a sore on R foot resulting in R BKA on 03/22/22.  Pt. Entered Pt with use of w/c and daughter Melanie Aye) present.  Pt. Wants to return to living independently.  Pt. Completing HEP from rehab center.  Pt. Has been unable to get into shower.    PERTINENT HISTORY: Melanie Juarez is a 77 y.o. ([birth year]) female who presents with chief complaint of     Chief Complaint  Patient presents with   Venous Insufficiency  .   History of Present Illness: Patient returns today in follow up of her right BKA.  It is healed.  She is in the process of getting her prosthesis now.  She is doing well.  No pain.  No left leg ulceration or rest pain.    Assessment/Plan   PAD (peripheral artery disease) (HCC) Check left ABI on follow up visit in 3 months.    Type 2 diabetes mellitus (HCC) blood glucose control important in reducing the progression of atherosclerotic disease. Also, involved in wound healing. On appropriate medications.    Essential hypertension blood pressure control important in reducing the progression of atherosclerotic disease. On appropriate oral medications.    Hx of BKA, right (HCC) Healed.  In the process of getting her prosthesis.  A new prescription given for physical therapy for prosthesis and ambulatory training.   Melanie Barren, MD   08/08/2022 10:42 AM  PAIN:  Are you having pain? No.  Pt. Reports phantom limb symptoms but not pain.    PRECAUTIONS: Fall  RED FLAGS: None   WEIGHT BEARING RESTRICTIONS: No  FALLS:  Has patient fallen in last 6 months? No  LIVING ENVIRONMENT: Lives with: lives with their daughter  Melanie Aye).   Lives in: House/apartment Stairs: No Has following equipment at home: Dan Humphreys - 2 wheeled,  Wheelchair (manual), and Ramped entry  OCCUPATION: Retired  PLOF: Independent.  Pt. Does not drive.    PATIENT GOALS: Ambulate with least assistive device.  Return home independently/ go back to church.    NEXT MD VISIT: Melanie Juarez 7/31.  MD f/u in September?  OBJECTIVE:   PATIENT SURVEYS:  FOTO initial 29/ goal 55.    COGNITION: Overall cognitive status: Within functional limits for tasks assessed   SENSATION: WFL  EDEMA:  No R residual limb swelling noted.  PT discussed use of shrinker and proper use of ply socks for improved fit of prosthetic leg.    POSTURE: rounded shoulders and forward head  PALPATION: Minimal tenderness along R distal residual limb/ incision.  Good incision healing/ PT discussed benefits of scar massage.    LOWER EXTREMITY ROM:  Active ROM Right eval Left eval  Hip flexion [facility] WFL  Hip extension NT NT  Hip abduction [facility]  Hip adduction    Hip internal rotation    Hip external rotation    Knee flexion [facility] WFL  Knee extension -3 deg. 0 deg.  Ankle dorsiflexion    Ankle plantarflexion    Ankle inversion    Ankle eversion     (Blank rows = not tested)  LOWER EXTREMITY MMT:  MMT Right eval Left eval  Hip flexion 4/5 4/5  Hip extension    Hip abduction 3+/5 4/5  Hip adduction    Hip internal rotation    Hip external rotation    Knee flexion 4+/5  4+/5  Knee extension 4+/5 5/5  Ankle dorsiflexion    Ankle plantarflexion    Ankle inversion    Ankle eversion     (Blank rows = not tested)   B UE AROM WFL.  B UE strength grossly 4/5 MMT except bicep/tricep 4+/5 MMT  FUNCTIONAL TESTS:  5 times sit to stand: TBD Timed up and go (TUG): TBD 6 minute walk test: TBD  GAIT: Distance walked: 45 feet in gym with RW/ min. A for safety and cuing Assistive device utilized: Walker - 2 wheeled Level of assistance: Min A Comments: amb. In gym with use of RW/min A for cuing to increase step length/ heel strike.  Pt. Fearful while walking  outside of //-bars.  Pt. Ambs. In //-bars with B UE assist and increase cadence/ step pattern.  Use of mirror for posture correction/ heel strike (pt. Does not looking at a mirror).    : 305 feet with SPC - pt's prosthetic started sliding into extreme ER, time paused briefly half-way through to allow pt time to reapply the leg  FOTO: 60 (goal met)  TODAY'S TREATMENT:  DATE: 01/22/2023   Subjective:  Pt. entered PT with use of small base QC and prosthetic leg donned. Pt. Brought in ply sock bag and has ordered QC for home use.    Treatment:   There.ex.:   Nustep level 4 (seat 10) x 8 minutes to improve cardiorespiratory endurance.  Discussed weekend activities.  No issues.  Neuro mm.:  Walking in //-bars (forward/backwards/lateral)- 4 laps each (light to no UE assit)/ walking over Airex pads and hurdles with focus on balance/ safety.  2 episodes of R toe clipping the hurdles during step pattern.  No LOB and able to progress to use of L UE assist only during hurdles.   Walking in gym with use of QC only and focus on consistent recip. Gait pattern - 1 lap - pt demonstrated improved reciprocal pattern; still requires occasional VC's for increased step length with LLE  Walking over obstacles forward and side ways x 4 reps in // bars  Sit to stands from gray chair/ Nustep with light UE assist and proper technique.  Good wt. Shift to prosthetic leg.    Ambulation to car from gym to car with QC and SBA/mod. I.      Not today: Fwd 6-inch step-ups: 1x10 each leg with 4# AW, BUE support Standing hip flexion/abduction/extension repeats with 4# AW, 1 minute bouts each direction/leg - increased RLE fatigue at end of exercise Alternating 6 inch step ups with QC and R handrail x 10 on each LE Forward Step-ups onto 6 inch step: 1x8 each leg, single UE support, 5# AW on each  leg Ambulation with QC along floor ladder to promote symmetrical step length: 2 laps - Min A occasional to correct loss of balance; pt better able to demonstrate reciprocal pattern towards last few laps  PATIENT EDUCATION:  Education details: Discussed HEP/ standing hip ex. At [employer] Person educated: Patient and Child(ren) Education method: Medical illustrator Education comprehension: verbalized understanding and returned demonstration  HOME EXERCISE PROGRAM: Access Code: [email or web address] URL: [email or web address] Date: 08/30/2022 Prepared by: Maylon Peppers  Exercises - Sit to Stand with Counter Support  - 1 x daily - 5 x weekly - 3 sets - 10 reps - Standing Hip Abduction with Counter Support  - 1 x daily - 5 x weekly - 3 sets - 10 reps - Standing March with Counter Support  - 1 x daily - 5 x weekly - 3 sets - 10 reps - Standing Hip Extension with Counter Support  - 1 x daily - 5 x weekly - 3 sets - 10 reps  ASSESSMENT:  CLINICAL IMPRESSION:       Patient presents to PT ready to focus on LE strengthening and balance/ gait training. Pts. Prosthetic leg fitting better today and Pt adjusted ply socks to 5 ply/3 ply socks.  Pt continues to demonstrate improved step length bilaterally while using the QC; she requires occasional cueing to increase LLE step length for improved reciprocal pattern. Pt is showing steady and consistent progress with gait, endurance and balance.  Pt will continue to benefit from skilled PT services to improve her ambulation and function with using a less restrictive assistive device (quad cane) and return her to her PLOF.  OBJECTIVE IMPAIRMENTS: Abnormal gait, decreased activity tolerance, decreased balance, decreased endurance, decreased mobility, difficulty walking, decreased ROM, decreased strength, decreased safety awareness, improper body mechanics, postural dysfunction, and pain.   ACTIVITY LIMITATIONS: carrying, lifting, standing,  squatting, stairs, transfers, bed mobility, bathing, toileting, dressing, and locomotion level  PARTICIPATION LIMITATIONS: meal prep, cleaning, laundry, shopping, community activity, and church  PERSONAL FACTORS: Fitness and Past/current experiences are also affecting patient's functional outcome.   REHAB POTENTIAL: Good  CLINICAL DECISION MAKING: Evolving/moderate complexity  EVALUATION COMPLEXITY: Moderate   GOALS: Goals reviewed with patient? Yes  LONG TERM GOALS: Target date: 02/01/23  Pt. Will increase FOTO to 46 to improve pain-free mobility.   Baseline: initial FOTO 29; 11/11: 60 Goal status: GOAL MET   2.  Pt. Will increase B LE muscle strength 1/2 muscle grade to improve pain-free mobility/ walking.   Baseline:  See above, 10/22: see above for updated scores Goal status: Partially met (see above for updated MMT)  3.  Pt. Able to ambulate 200 feet with mod. I with least assistive device to improve return to home.   Baseline: amb. Short distances with RW/ min. A from PT.  Limited endurance; 10/22: 424 feet with RW during Goal status: GOAL MET (10/22) 4. Pt will increase by at least 62m (121ft) when using LRAD (quad cane) in order to demonstrate clinically significant improvement in cardiopulmonary endurance and community ambulation  Baseline: 140 feet with quad cane; 11/11: 305 feet with quad cane  Goal status: GOAL MET (11/11)  5. Pt will be demonstrate ascending/descending at least 4 stairs with reciprocal pattern to improve independence with community mobility.  Baseline: step-to pattern with BUE assist  Goal status: initial  6. Pt will be able to ambulate at least 500 feet during while using the QC in order to show improvement in walking tolerance and community mobility.  Baseline: 11/11: 305 feet with QC  Goal status: not met, progressing    PLAN:  PT FREQUENCY: 2x/week  PT DURATION: 6 weeks  PLANNED INTERVENTIONS: Therapeutic exercises,  Therapeutic activity, Neuromuscular re-education, Balance training, Gait training, Patient/Family education, Self Care, Joint mobilization, Stair training, Prosthetic training, DME instructions, Manual therapy, and Re-evaluation  PLAN FOR NEXT SESSION: Progress gait training to use of QC with mod. I.  LE strengthening/ balance training.    Janet Berlin PT DPT 2:30 PM,01/22/23

## 2023-01-23 NOTE — Therapy (Signed)
OUTPATIENT PHYSICAL THERAPY LOWER EXTREMITY TREATMENT  Patient Name: Melanie Juarez MRN: [id number] DOB:24-Dec-1945, 77 y.o., female Today's Date: 01/24/2023  END OF SESSION:  PT End of Session - 01/24/23 1301     Visit Number 34    Number of Visits 39    Date for PT Re-Evaluation 02/01/23    PT Start Time 1301    PT Stop Time 1343    PT Time Calculation (min) 42 min    Equipment Utilized During Treatment Gait belt    Activity Tolerance Patient tolerated treatment well    Behavior During Therapy WFL for tasks assessed/performed               Past Medical History:  Diagnosis Date   Diabetes (HCC)    Hypertension    Past Surgical History:  Procedure Laterality Date   AMPUTATION Right 03/22/2022   Procedure: AMPUTATION BELOW KNEE-Guillotine;  Surgeon: Melanie Needy, MD;  Location: [facility];  Service: Vascular;  Laterality: Right;   LOWER EXTREMITY ANGIOGRAPHY Right 03/21/2022   Procedure: Lower Extremity Angiography;  Surgeon: Melanie Needy, MD;  Location: [facility];  Service: Cardiovascular;  Laterality: Right;   STUMP REVISION Right 03/26/2022   Procedure: BKA REVISION;  Surgeon: Melanie Needy, MD;  Location: [facility];  Service: General;  Laterality: Right;   Patient Active Problem List   Diagnosis Date Noted   Hx of BKA, right (HCC) 08/08/2022   Lactic acidosis 03/21/2022   Obesity (BMI 30-39.9) 03/21/2022   PAD (peripheral artery disease) (HCC) 03/21/2022   Osteomyelitis (HCC) 03/19/2022   Diabetic infection of right foot (HCC) 03/19/2022   Essential hypertension 03/19/2022   Leukocytosis 03/19/2022   AKI (acute kidney injury) (HCC) 03/19/2022   Sepsis with acute renal failure without septic shock (HCC) 03/19/2022   Hyperlipidemia 05/17/2014   Type 2 diabetes mellitus (HCC) 05/17/2014   Anemia, unspecified 05/18/2013   Benign essential hypertension 05/18/2013   REFERRING PROVIDER: Annice Needy, MD  REFERRING DIAG: s/p R BKA  THERAPY DIAG:  Difficulty  in walking, not elsewhere classified  Hx of BKA, right (HCC)  Muscle weakness (generalized)  Unsteadiness on feet  Gait difficulty  Rationale for Evaluation and Treatment: Rehabilitation  ONSET DATE: 03/22/22  SUBJECTIVE:   SUBJECTIVE STATEMENT: Pt. Had a sore on R foot resulting in R BKA on 03/22/22.  Pt. Entered Pt with use of w/c and daughter Melanie Juarez) present.  Pt. Wants to return to living independently.  Pt. Completing HEP from rehab center.  Pt. Has been unable to get into shower.    PERTINENT HISTORY: Melanie Juarez is a 77 y.o. (1945/06/03) female who presents with chief complaint of     Chief Complaint  Patient presents with   Venous Insufficiency  .   History of Present Illness: Patient returns today in follow up of her right BKA.  It is healed.  She is in the process of getting her prosthesis now.  She is doing well.  No pain.  No left leg ulceration or rest pain.    Assessment/Plan   PAD (peripheral artery disease) (HCC) Check left ABI on follow up visit in 3 months.    Type 2 diabetes mellitus (HCC) blood glucose control important in reducing the progression of atherosclerotic disease. Also, involved in wound healing. On appropriate medications.    Essential hypertension blood pressure control important in reducing the progression of atherosclerotic disease. On appropriate oral medications.    Hx of BKA, right (HCC)  Healed.  In the process of getting her prosthesis.  A new prescription given for physical therapy for prosthesis and ambulatory training.   Melanie Barren, MD   08/08/2022 10:42 AM  PAIN:  Are you having pain? No.  Pt. Reports phantom limb symptoms but not pain.    PRECAUTIONS: Fall  RED FLAGS: None   WEIGHT BEARING RESTRICTIONS: No  FALLS:  Has patient fallen in last 6 months? No  LIVING ENVIRONMENT: Lives with: lives with their daughter  Melanie Juarez).   Lives in: House/apartment Stairs: No Has following equipment at home: Dan Humphreys - 2 wheeled,  Wheelchair (manual), and Ramped entry  OCCUPATION: Retired  PLOF: Independent.  Pt. Does not drive.    PATIENT GOALS: Ambulate with least assistive device.  Return home independently/ go back to church.    NEXT MD VISIT: Melanie Juarez 7/31.  MD f/u in September?  OBJECTIVE:   PATIENT SURVEYS:  FOTO initial 29/ goal 40.    COGNITION: Overall cognitive status: Within functional limits for tasks assessed   SENSATION: WFL  EDEMA:  No R residual limb swelling noted.  PT discussed use of shrinker and proper use of ply socks for improved fit of prosthetic leg.    POSTURE: rounded shoulders and forward head  PALPATION: Minimal tenderness along R distal residual limb/ incision.  Good incision healing/ PT discussed benefits of scar massage.    LOWER EXTREMITY ROM:  Active ROM Right eval Left eval  Hip flexion [facility] WFL  Hip extension NT NT  Hip abduction [facility]  Hip adduction    Hip internal rotation    Hip external rotation    Knee flexion [facility] WFL  Knee extension -3 deg. 0 deg.  Ankle dorsiflexion    Ankle plantarflexion    Ankle inversion    Ankle eversion     (Blank rows = not tested)  LOWER EXTREMITY MMT:  MMT Right eval Left eval  Hip flexion 4/5 4/5  Hip extension    Hip abduction 3+/5 4/5  Hip adduction    Hip internal rotation    Hip external rotation    Knee flexion 4+/5  4+/5  Knee extension 4+/5 5/5  Ankle dorsiflexion    Ankle plantarflexion    Ankle inversion    Ankle eversion     (Blank rows = not tested)   B UE AROM WFL.  B UE strength grossly 4/5 MMT except bicep/tricep 4+/5 MMT  FUNCTIONAL TESTS:  5 times sit to stand: TBD Timed up and go (TUG): TBD 6 minute walk test: TBD  GAIT: Distance walked: 45 feet in gym with RW/ min. A for safety and cuing Assistive device utilized: Jillana Selph - 2 wheeled Level of assistance: Min A Comments: amb. In gym with use of RW/min A for cuing to increase step length/ heel strike.  Pt. Fearful while walking  outside of //-bars.  Pt. Ambs. In //-bars with B UE assist and increase cadence/ step pattern.  Use of mirror for posture correction/ heel strike (pt. Does not looking at a mirror).    : 305 feet with SPC - pt's prosthetic started sliding into extreme ER, time paused briefly half-way through to allow pt time to reapply the leg  FOTO: 60 (goal met)  TODAY'S TREATMENT:  DATE: 01/24/2023    Subjective:  Pt. entered PT with use of small base QC and prosthetic leg donned. Pt. Brought in ply sock bag and has ordered QC for home use.    Treatment:   There.ex.:   Nustep level 4 (seat 10) x 8 minutes to improve cardiorespiratory endurance.  Discussed weekend activities.  No issues.  Neuro mm.:  Walking in //-bars (forward/backwards)- 2 laps each (light to no UE assit)/ walking over Airex pads and hurdles with focus on balance/ safety.  2 episodes of R toe clipping the hurdles during step pattern.  No LOB and able to progress to use of L UE assist only during hurdles.   Walking in gym with use of QC only and focus on consistent recip. Gait pattern - 1 lap - pt demonstrated improved reciprocal pattern; still requires occasional VC's for increased step length with LLE  Walking over obstacles forward and side ways (1" wooden plank, airex, 2 6" hurdles x 2 reps in // bars each direction   Sit to stands from gray chair with light UE assist and proper technique.  Good wt. Shift to prosthetic leg.    Ambulation to car from gym with QC and SBA/mod. I.      Not today: Fwd 6-inch step-ups: 1x10 each leg with 4# AW, BUE support Standing hip flexion/abduction/extension repeats with 4# AW, 1 minute bouts each direction/leg - increased RLE fatigue at end of exercise Alternating 6 inch step ups with QC and R handrail x 10 on each LE Forward Step-ups onto 6 inch step: 1x8 each leg,  single UE support, 5# AW on each leg Ambulation with QC along floor ladder to promote symmetrical step length: 2 laps - Min A occasional to correct loss of balance; pt better able to demonstrate reciprocal pattern towards last few laps  PATIENT EDUCATION:  Education details: Discussed HEP/ standing hip ex. At [employer] Person educated: Patient and Child(ren) Education method: Medical illustrator Education comprehension: verbalized understanding and returned demonstration  HOME EXERCISE PROGRAM: Access Code: [email or web address] URL: [email or web address] Date: 08/30/2022 Prepared by: Maylon Peppers  Exercises - Sit to Stand with Counter Support  - 1 x daily - 5 x weekly - 3 sets - 10 reps - Standing Hip Abduction with Counter Support  - 1 x daily - 5 x weekly - 3 sets - 10 reps - Standing March with Counter Support  - 1 x daily - 5 x weekly - 3 sets - 10 reps - Standing Hip Extension with Counter Support  - 1 x daily - 5 x weekly - 3 sets - 10 reps  ASSESSMENT:  CLINICAL IMPRESSION:        Patient arrives to treatment session motivated to participate. Session focused on gait training with QC and obstacle negotiation. Continues to require SBA-CGA with use of QC. Continues to show consistent progress towards goals. Pt will continue to benefit from skilled PT services to improve her ambulation and function with using a less restrictive assistive device (quad cane) and return her to her PLOF.   OBJECTIVE IMPAIRMENTS: Abnormal gait, decreased activity tolerance, decreased balance, decreased endurance, decreased mobility, difficulty walking, decreased ROM, decreased strength, decreased safety awareness, improper body mechanics, postural dysfunction, and pain.   ACTIVITY LIMITATIONS: carrying, lifting, standing, squatting, stairs, transfers, bed mobility, bathing, toileting, dressing, and locomotion level  PARTICIPATION LIMITATIONS: meal prep, cleaning, laundry, shopping,  community activity, and church  PERSONAL FACTORS: Fitness and Past/current experiences are also affecting patient's functional outcome.  REHAB POTENTIAL: Good  CLINICAL DECISION MAKING: Evolving/moderate complexity  EVALUATION COMPLEXITY: Moderate   GOALS: Goals reviewed with patient? Yes  LONG TERM GOALS: Target date: 02/01/23  Pt. Will increase FOTO to 46 to improve pain-free mobility.   Baseline: initial FOTO 29; 11/11: 60 Goal status: GOAL MET   2.  Pt. Will increase B LE muscle strength 1/2 muscle grade to improve pain-free mobility/ walking.   Baseline:  See above, 10/22: see above for updated scores Goal status: Partially met (see above for updated MMT)  3.  Pt. Able to ambulate 200 feet with mod. I with least assistive device to improve return to home.   Baseline: amb. Short distances with RW/ min. A from PT.  Limited endurance; 10/22: 424 feet with RW during Goal status: GOAL MET (10/22) 4. Pt will increase by at least 51m (195ft) when using LRAD (quad cane) in order to demonstrate clinically significant improvement in cardiopulmonary endurance and community ambulation  Baseline: 140 feet with quad cane; 11/11: 305 feet with quad cane  Goal status: GOAL MET (11/11)  5. Pt will be demonstrate ascending/descending at least 4 stairs with reciprocal pattern to improve independence with community mobility.  Baseline: step-to pattern with BUE assist  Goal status: initial  6. Pt will be able to ambulate at least 500 feet during while using the QC in order to show improvement in walking tolerance and community mobility.  Baseline: 11/11: 305 feet with QC  Goal status: not met, progressing    PLAN:  PT FREQUENCY: 2x/week  PT DURATION: 6 weeks  PLANNED INTERVENTIONS: Therapeutic exercises, Therapeutic activity, Neuromuscular re-education, Balance training, Gait training, Patient/Family education, Self Care, Joint mobilization, Stair training, Prosthetic  training, DME instructions, Manual therapy, and Re-evaluation  PLAN FOR NEXT SESSION: Progress gait training to use of QC with mod. I.  LE strengthening/ balance training.    Maylon Peppers, PT, DPT Physical Therapist - [place]  1:01 PM,01/24/23

## 2023-01-24 ENCOUNTER — Ambulatory Visit: Payer: Medicare HMO

## 2023-01-24 ENCOUNTER — Encounter: Payer: Self-pay | Admitting: Physical Therapy

## 2023-01-24 DIAGNOSIS — R2681 Unsteadiness on feet: Secondary | ICD-10-CM | POA: Diagnosis not present

## 2023-01-24 DIAGNOSIS — M6281 Muscle weakness (generalized): Secondary | ICD-10-CM | POA: Diagnosis not present

## 2023-01-24 DIAGNOSIS — R269 Unspecified abnormalities of gait and mobility: Secondary | ICD-10-CM

## 2023-01-24 DIAGNOSIS — Z89511 Acquired absence of right leg below knee: Secondary | ICD-10-CM | POA: Diagnosis not present

## 2023-01-24 DIAGNOSIS — R262 Difficulty in walking, not elsewhere classified: Secondary | ICD-10-CM | POA: Diagnosis not present

## 2023-01-29 ENCOUNTER — Encounter: Payer: Self-pay | Admitting: Physical Therapy

## 2023-01-29 ENCOUNTER — Ambulatory Visit: Payer: Medicare HMO | Admitting: Physical Therapy

## 2023-01-29 DIAGNOSIS — R269 Unspecified abnormalities of gait and mobility: Secondary | ICD-10-CM | POA: Diagnosis not present

## 2023-01-29 DIAGNOSIS — M6281 Muscle weakness (generalized): Secondary | ICD-10-CM

## 2023-01-29 DIAGNOSIS — Z89511 Acquired absence of right leg below knee: Secondary | ICD-10-CM

## 2023-01-29 DIAGNOSIS — R262 Difficulty in walking, not elsewhere classified: Secondary | ICD-10-CM

## 2023-01-29 DIAGNOSIS — R2681 Unsteadiness on feet: Secondary | ICD-10-CM | POA: Diagnosis not present

## 2023-01-29 NOTE — Therapy (Signed)
OUTPATIENT PHYSICAL THERAPY LOWER EXTREMITY TREATMENT  Patient Name: Melanie Juarez MRN: [id number] DOB:1945/07/16, 77 y.o., female Today's Date: 01/29/2023  END OF SESSION:  PT End of Session - 01/29/23 1253     Visit Number 35    Number of Visits 39    Date for PT Re-Evaluation 02/01/23    PT Start Time 1253    PT Stop Time 1346    PT Time Calculation (min) 53 min    Equipment Utilized During Treatment Gait belt    Activity Tolerance Patient tolerated treatment well    Behavior During Therapy WFL for tasks assessed/performed             Past Medical History:  Diagnosis Date   Diabetes (HCC)    Hypertension    Past Surgical History:  Procedure Laterality Date   AMPUTATION Right 03/22/2022   Procedure: AMPUTATION BELOW KNEE-Guillotine;  Surgeon: Annice Needy, MD;  Location: [facility] ORS;  Service: Vascular;  Laterality: Right;   LOWER EXTREMITY ANGIOGRAPHY Right 03/21/2022   Procedure: Lower Extremity Angiography;  Surgeon: Annice Needy, MD;  Location: [facility] IN[facility]ASIVE [facility];  Service: Cardiovascular;  Laterality: Right;   STUMP REVISION Right 03/26/2022   Procedure: BKA REVISION;  Surgeon: Annice Needy, MD;  Location: [facility] ORS;  Service: General;  Laterality: Right;   Patient Active Problem List   Diagnosis Date Noted   Hx of BKA, right (HCC) 08/08/2022   Lactic acidosis 03/21/2022   Obesity (BMI 30-39.9) 03/21/2022   PAD (peripheral artery disease) (HCC) 03/21/2022   Osteomyelitis (HCC) 03/19/2022   Diabetic infection of right foot (HCC) 03/19/2022   Essential hypertension 03/19/2022   Leukocytosis 03/19/2022   AKI (acute kidney injury) (HCC) 03/19/2022   Sepsis with acute renal failure without septic shock (HCC) 03/19/2022   Hyperlipidemia 05/17/2014   Type 2 diabetes mellitus (HCC) 05/17/2014   Anemia, unspecified 05/18/2013   Benign essential hypertension 05/18/2013   REFERRING PROVIDER: Annice Needy, MD  REFERRING DIAG: s/p R BKA  THERAPY DIAG:  Difficulty in  walking, not elsewhere classified  Hx of BKA, right (HCC)  Muscle weakness (generalized)  Unsteadiness on feet  Gait difficulty  Rationale for Evaluation and Treatment: Rehabilitation  ONSET DATE: 03/22/22  SUBJECTIVE:   SUBJECTIVE STATEMENT: Pt. Had a sore on R foot resulting in R BKA on 03/22/22.  [name]  Pt. Wants to return to living independently.  Pt. Completing HEP from rehab center.  Pt. Has been unable to get into shower.    PERTINENT HISTORY: ELLESE Juarez is a 77 y.o. ([birth year]) female who presents with chief complaint of     Chief Complaint  Patient presents with   Venous Insufficiency  .   History of Present Illness: Patient returns today in follow up of her right BKA.  It is healed.  She is in the process of getting her prosthesis now.  She is doing well.  No pain.  No left leg ulceration or rest pain.    Assessment/Plan   PAD (peripheral artery disease) (HCC) Check left ABI on follow up visit in 3 months.    Type 2 diabetes mellitus (HCC) blood glucose control important in reducing the progression of atherosclerotic disease. Also, involved in wound healing. On appropriate medications.    Essential hypertension blood pressure control important in reducing the progression of atherosclerotic disease. On appropriate oral medications.    Hx of BKA, right (HCC) Healed.  In the process of getting her prosthesis.  A new prescription given for physical therapy for prosthesis and ambulatory training.   [name], MD   08/08/2022 10:42 AM  PAIN:  Are you having pain? No.  Pt. Reports phantom limb symptoms but not pain.    PRECAUTIONS: Fall  RED FLAGS: None   WEIGHT BEARING RESTRICTIONS: No  FALLS:  Has patient fallen in last 6 months? No  LIVING ENVIRONMENT: Lives with: lives with their daughter  [name].   Lives in: House/apartment Stairs: No Has following equipment at home: Dan Humphreys - 2 wheeled,  Wheelchair (manual), and Ramped entry  OCCUPATION: Retired  PLOF: Independent.  Pt. Does not drive.    PATIENT GOALS: Ambulate with least assistive device.  Return home independently/ go back to church.    NEXT MD VISIT: [name] 7/31.  MD f/u in September?  OBJECTIVE:   PATIENT SURVEYS:  FOTO initial 29/ goal 74.    COGNITION: Overall cognitive status: Within functional limits for tasks assessed   SENSATION: WFL  EDEMA:  No R residual limb swelling noted.  PT discussed use of shrinker and proper use of ply socks for improved fit of prosthetic leg.    POSTURE: rounded shoulders and forward head  PALPATION: Minimal tenderness along R distal residual limb/ incision.  Good incision healing/ PT discussed benefits of scar massage.    LOWER EXTREMITY ROM:  Active ROM Right eval Left eval  Hip flexion [facility] WFL  Hip extension NT NT  Hip abduction [facility]  Hip adduction    Hip internal rotation    Hip external rotation    Knee flexion [facility] WFL  Knee extension -3 deg. 0 deg.  Ankle dorsiflexion    Ankle plantarflexion    Ankle inversion    Ankle eversion     (Blank rows = not tested)  LOWER EXTREMITY MMT:  MMT Right eval Left eval  Hip flexion 4/5 4/5  Hip extension    Hip abduction 3+/5 4/5  Hip adduction    Hip internal rotation    Hip external rotation    Knee flexion 4+/5  4+/5  Knee extension 4+/5 5/5  Ankle dorsiflexion    Ankle plantarflexion    Ankle inversion    Ankle eversion     (Blank rows = not tested)   B UE AROM WFL.  B UE strength grossly 4/5 MMT except bicep/tricep 4+/5 MMT  FUNCTIONAL TESTS:  5 times sit to stand: TBD Timed up and go (TUG): TBD 6 minute walk test: TBD  GAIT: Distance walked: 45 feet in gym with RW/ min. A for safety and cuing Assistive device utilized: Walker - 2 wheeled Level of assistance: Min A Comments: amb. In gym with use of RW/min A for cuing to increase step length/ heel strike.  Pt. Fearful while walking  outside of //-bars.  Pt. Ambs. In //-bars with B UE assist and increase cadence/ step pattern.  Use of mirror for posture correction/ heel strike (pt. Does not looking at a mirror).    : 305 feet with SPC - pt's prosthetic started sliding into extreme ER, time paused briefly half-way through to allow pt time to reapply the leg  FOTO: 60 (goal met)  TODAY'S TREATMENT:  DATE: 01/29/2023    Subjective:  Pt. entered PT with use of small base QC and prosthetic leg donned.  No new complaints.    Treatment:   There.ex.:  No charge  Nustep level 4 (seat 9) x 10 minutes to improve cardiorespiratory endurance.  Discussed weekend activities.  No issues.  Neuro mm.:  Ascending/descending 4 stairs x 2 with recip. Pattern and R sided handrail/ L sided QC.    Walking at agility ladder working on consistent step length/ BOS with use of QC.  SBA for safety/ cuing.  Amb. With no assistive device on last lap.  Marked increase in R antalgic/ step to gait without QC.  No LOB.    Standing at stairs (6"): cone taps with L/R LE with use of QC and CGA for safety.    Walking in //-bars (forward/backwards)- 2 laps each (light to no UE assit).  High marching.  No LOB and able to progress to use of L UE assist only during hurdles.   Walking in gym with use of QC only and focus on consistent recip. Gait pattern - 1 lap - pt demonstrated improved reciprocal pattern; still requires occasional VC's for increased step length with LLE.  Walking in clinic/ hallway/ outside to car with no assistive device.        PATIENT EDUCATION:  Education details: Discussed HEP/ standing hip ex. At [employer] Person educated: Patient and Child(ren) Education method: Medical illustrator Education comprehension: verbalized understanding and returned demonstration  HOME EXERCISE  PROGRAM: Access Code: [email or web address] URL: [email or web address] Date: 08/30/2022 Prepared by: Maylon Peppers  Exercises - Sit to Stand with Counter Support  - 1 x daily - 5 x weekly - 3 sets - 10 reps - Standing Hip Abduction with Counter Support  - 1 x daily - 5 x weekly - 3 sets - 10 reps - Standing March with Counter Support  - 1 x daily - 5 x weekly - 3 sets - 10 reps - Standing Hip Extension with Counter Support  - 1 x daily - 5 x weekly - 3 sets - 10 reps  ASSESSMENT:  CLINICAL IMPRESSION:        Patient arrives to treatment session motivated to participate. Session focused on gait training with QC and obstacle negotiation. Continues to require SBA-CGA with use of QC. Continues to show consistent progress towards goals.  Pt. Motivated to ambulate short distances around home with no assistive device.  Pt. Demonstrates ability to ambulate without assistive device and CGA safely.  Pt will continue to benefit from skilled PT services to improve her ambulation and function with using a less restrictive assistive device (quad cane) and return her to her PLOF.   OBJECTIVE IMPAIRMENTS: Abnormal gait, decreased activity tolerance, decreased balance, decreased endurance, decreased mobility, difficulty walking, decreased ROM, decreased strength, decreased safety awareness, improper body mechanics, postural dysfunction, and pain.   ACTIVITY LIMITATIONS: carrying, lifting, standing, squatting, stairs, transfers, bed mobility, bathing, toileting, dressing, and locomotion level  PARTICIPATION LIMITATIONS: meal prep, cleaning, laundry, shopping, community activity, and church  PERSONAL FACTORS: Fitness and Past/current experiences are also affecting patient's functional outcome.   REHAB POTENTIAL: Good  CLINICAL DECISION MAKING: Evolving/moderate complexity  EVALUATION COMPLEXITY: Moderate   GOALS: Goals reviewed with patient? Yes  LONG TERM GOALS: Target date: 02/01/23  Pt. Will  increase FOTO to 46 to improve pain-free mobility.   Baseline: initial FOTO 29; 11/11: 60 Goal status: GOAL MET   2.  Pt. Will increase B LE  muscle strength 1/2 muscle grade to improve pain-free mobility/ walking.   Baseline:  See above, 10/22: see above for updated scores Goal status: Partially met (see above for updated MMT)  3.  Pt. Able to ambulate 200 feet with mod. I with least assistive device to improve return to home.   Baseline: amb. Short distances with RW/ min. A from PT.  Limited endurance; 10/22: 424 feet with RW during Goal status: GOAL MET (10/22) 4. Pt will increase by at least 17m (121ft) when using LRAD (quad cane) in order to demonstrate clinically significant improvement in cardiopulmonary endurance and community ambulation  Baseline: 140 feet with quad cane; 11/11: 305 feet with quad cane  Goal status: GOAL MET (11/11)  5. Pt will be demonstrate ascending/descending at least 4 stairs with reciprocal pattern to improve independence with community mobility.  Baseline: step-to pattern with BUE assist  Goal status: initial  6. Pt will be able to ambulate at least 500 feet during while using the QC in order to show improvement in walking tolerance and community mobility.  Baseline: 11/11: 305 feet with QC  Goal status: not met, progressing    PLAN:  PT FREQUENCY: 2x/week  PT DURATION: 6 weeks  PLANNED INTERVENTIONS: Therapeutic exercises, Therapeutic activity, Neuromuscular re-education, Balance training, Gait training, Patient/Family education, Self Care, Joint mobilization, Stair training, Prosthetic training, DME instructions, Manual therapy, and Re-evaluation  PLAN FOR NEXT SESSION: Progress gait training to use of QC with mod. I.  LE strengthening/ balance training.  CHECK GOALS/ schedule next visit.   Cammie Mcgee, PT, DPT # [phone] Physical Therapist - [place]  [facility], [place]  6:21 PM,01/29/23

## 2023-01-31 ENCOUNTER — Ambulatory Visit: Payer: Medicare HMO | Admitting: Physical Therapy

## 2023-01-31 DIAGNOSIS — R269 Unspecified abnormalities of gait and mobility: Secondary | ICD-10-CM | POA: Diagnosis not present

## 2023-01-31 DIAGNOSIS — Z89511 Acquired absence of right leg below knee: Secondary | ICD-10-CM | POA: Diagnosis not present

## 2023-01-31 DIAGNOSIS — R2681 Unsteadiness on feet: Secondary | ICD-10-CM

## 2023-01-31 DIAGNOSIS — R262 Difficulty in walking, not elsewhere classified: Secondary | ICD-10-CM | POA: Diagnosis not present

## 2023-01-31 DIAGNOSIS — M6281 Muscle weakness (generalized): Secondary | ICD-10-CM | POA: Diagnosis not present

## 2023-01-31 NOTE — Therapy (Signed)
OUTPATIENT PHYSICAL THERAPY LOWER EXTREMITY TREATMENT  Patient Name: Melanie Juarez MRN: [id number] DOB:August 02, 1945, 77 y.o., female Today's Date: 01/31/2023  END OF SESSION:  PT End of Session - 01/31/23 1300     Visit Number 36    Number of Visits 39    Date for PT Re-Evaluation 02/01/23    PT Start Time 1300    PT Stop Time 1347    PT Time Calculation (min) 47 min    Equipment Utilized During Treatment Gait belt    Activity Tolerance Patient tolerated treatment well    Behavior During Therapy WFL for tasks assessed/performed             Past Medical History:  Diagnosis Date   Diabetes (HCC)    Hypertension    Past Surgical History:  Procedure Laterality Date   AMPUTATION Right 03/22/2022   Procedure: AMPUTATION BELOW KNEE-Guillotine;  Surgeon: Annice Needy, MD;  Location: [facility] ORS;  Service: Vascular;  Laterality: Right;   LOWER EXTREMITY ANGIOGRAPHY Right 03/21/2022   Procedure: Lower Extremity Angiography;  Surgeon: Annice Needy, MD;  Location: [facility] INV[facility]IVE CV [facility];  Service: Cardiovascular;  Laterality: Right;   STUMP REVISION Right 03/26/2022   Procedure: BKA REVISION;  Surgeon: Annice Needy, MD;  Location: [facility] ORS;  Service: General;  Laterality: Right;   Patient Active Problem List   Diagnosis Date Noted   Hx of BKA, right (HCC) 08/08/2022   Lactic acidosis 03/21/2022   Obesity (BMI 30-39.9) 03/21/2022   PAD (peripheral artery disease) (HCC) 03/21/2022   Osteomyelitis (HCC) 03/19/2022   Diabetic infection of right foot (HCC) 03/19/2022   Essential hypertension 03/19/2022   Leukocytosis 03/19/2022   AKI (acute kidney injury) (HCC) 03/19/2022   Sepsis with acute renal failure without septic shock (HCC) 03/19/2022   Hyperlipidemia 05/17/2014   Type 2 diabetes mellitus (HCC) 05/17/2014   Anemia, unspecified 05/18/2013   Benign essential hypertension 05/18/2013   REFERRING PROVIDER: Annice Needy, MD  REFERRING DIAG: s/p R BKA  THERAPY DIAG:  Difficulty in  walking, not elsewhere classified  Hx of BKA, right (HCC)  Muscle weakness (generalized)  Unsteadiness on feet  Gait difficulty  Rationale for Evaluation and Treatment: Rehabilitation  ONSET DATE: 03/22/22  SUBJECTIVE:   SUBJECTIVE STATEMENT: Pt. Had a sore on R foot resulting in R BKA on 03/22/22.  [name]  Pt. Wants to return to living independently.  Pt. Completing HEP from rehab center.  Pt. Has been unable to get into shower.    PERTINENT HISTORY: TALAIJAH MOREHOUSE is a 77 y.o. (30-Sep-1945) female who presents with chief complaint of     Chief Complaint  Patient presents with   Venous Insufficiency  .   History of Present Illness: Patient returns today in follow up of her right BKA.  It is healed.  She is in the process of getting her prosthesis now.  She is doing well.  No pain.  No left leg ulceration or rest pain.    Assessment/Plan   PAD (peripheral artery disease) (HCC) Check left ABI on follow up visit in 3 months.    Type 2 diabetes mellitus (HCC) blood glucose control important in reducing the progression of atherosclerotic disease. Also, involved in wound healing. On appropriate medications.    Essential hypertension blood pressure control important in reducing the progression of atherosclerotic disease. On appropriate oral medications.    Hx of BKA, right (HCC) Healed.  In the process of getting her prosthesis.  A new prescription given for physical therapy for prosthesis and ambulatory training.   [name], MD   08/08/2022 10:42 AM  PAIN:  Are you having pain? No.  Pt. Reports phantom limb symptoms but not pain.    PRECAUTIONS: Fall  RED FLAGS: None   WEIGHT BEARING RESTRICTIONS: No  FALLS:  Has patient fallen in last 6 months? No  LIVING ENVIRONMENT: Lives with: lives with their daughter  [name].   Lives in: House/apartment Stairs: No Has following equipment at home: Dan Humphreys - 2 wheeled,  Wheelchair (manual), and Ramped entry  OCCUPATION: Retired  PLOF: Independent.  Pt. Does not drive.    PATIENT GOALS: Ambulate with least assistive device.  Return home independently/ go back to church.    NEXT MD VISIT: [name] 7/31.  MD f/u in September?  OBJECTIVE:   PATIENT SURVEYS:  FOTO initial 29/ goal 85.    COGNITION: Overall cognitive status: Within functional limits for tasks assessed   SENSATION: WFL  EDEMA:  No R residual limb swelling noted.  PT discussed use of shrinker and proper use of ply socks for improved fit of prosthetic leg.    POSTURE: rounded shoulders and forward head  PALPATION: Minimal tenderness along R distal residual limb/ incision.  Good incision healing/ PT discussed benefits of scar massage.    LOWER EXTREMITY ROM:  Active ROM Right eval Left eval  Hip flexion [facility] WFL  Hip extension NT NT  Hip abduction [facility]  Hip adduction    Hip internal rotation    Hip external rotation    Knee flexion [facility] WFL  Knee extension -3 deg. 0 deg.  Ankle dorsiflexion    Ankle plantarflexion    Ankle inversion    Ankle eversion     (Blank rows = not tested)  LOWER EXTREMITY MMT:  MMT Right eval Left eval  Hip flexion 4/5 4/5  Hip extension    Hip abduction 3+/5 4/5  Hip adduction    Hip internal rotation    Hip external rotation    Knee flexion 4+/5  4+/5  Knee extension 4+/5 5/5  Ankle dorsiflexion    Ankle plantarflexion    Ankle inversion    Ankle eversion     (Blank rows = not tested)   B UE AROM WFL.  B UE strength grossly 4/5 MMT except bicep/tricep 4+/5 MMT  FUNCTIONAL TESTS:  5 times sit to stand: TBD Timed up and go (TUG): TBD 6 minute walk test: TBD  GAIT: Distance walked: 45 feet in gym with RW/ min. A for safety and cuing Assistive device utilized: Walker - 2 wheeled Level of assistance: Min A Comments: amb. In gym with use of RW/min A for cuing to increase step length/ heel strike.  Pt. Fearful while walking  outside of //-bars.  Pt. Ambs. In //-bars with B UE assist and increase cadence/ step pattern.  Use of mirror for posture correction/ heel strike (pt. Does not looking at a mirror).    : 305 feet with SPC - pt's prosthetic started sliding into extreme ER, time paused briefly half-way through to allow pt time to reapply the leg  FOTO: 60 (goal met)  TODAY'S TREATMENT:  DATE: 01/31/2023    Subjective:  Pt. entered PT gym with use of small base QC and prosthetic leg donned.  No new complaints.  Pt. Will return to PT in over a week due to Christmas holiday.  Pt. Will continue to walk and stay active with daily household tasks/ HEP.    Treatment:   There.ex.:   Reviewed HEP  Nustep B UE/LE level 4 (seat 10) x 10 minutes to improve cardiorespiratory endurance.  Discussed weekend activities.  No issues.  Standing hip flexion/ abduction/ extension 20x at //-bars with UE assist required on //-bars, esp. During L hip ROM.    Neuro mm.:  Walking in //-bars (forward only)- 4 laps each (light to no UE assit).  High marching.  Improve LE control without UE assist.  Use of mirror for posture correction.  Pt. Tends to look down.    Walking in //-bars with 6" step ups/ downs with 1 UE assist/ progressing to no UE assist.    Ascending/descending 4 stairs x 2 with recip. Pattern with ascending and step to with descending with B UE assist.  Good control.    Walking at agility ladder working on consistent step length/ BOS with use of QC.  SBA for safety/ cuing.  Amb. With no assistive device on last lap.  Marked increase in R antalgic/ step to gait without QC.  No LOB.    Walking in gym with use of QC only and focus on consistent recip. Gait pattern - 1 lap - pt demonstrated improved reciprocal pattern; still requires occasional VC's for increased step length with LLE.  Walking in  clinic/ hallway/ outside to car with no assistive device.        PATIENT EDUCATION:  Education details: Discussed HEP/ standing hip ex. At [employer] Person educated: Patient and Child(ren) Education method: Medical illustrator Education comprehension: verbalized understanding and returned demonstration  HOME EXERCISE PROGRAM: Access Code: [email or web address] URL: [email or web address] Date: 08/30/2022 Prepared by: Maylon Peppers  Exercises - Sit to Stand with Counter Support  - 1 x daily - 5 x weekly - 3 sets - 10 reps - Standing Hip Abduction with Counter Support  - 1 x daily - 5 x weekly - 3 sets - 10 reps - Standing March with Counter Support  - 1 x daily - 5 x weekly - 3 sets - 10 reps - Standing Hip Extension with Counter Support  - 1 x daily - 5 x weekly - 3 sets - 10 reps  ASSESSMENT:  CLINICAL IMPRESSION:        Patient arrives to treatment session motivated to participate. Session focused on gait training with QC and obstacle negotiation. Continues to require SBA-CGA with use of QC. Continues to show consistent progress towards goals.  Pt. Motivated to ambulate short distances around home with no assistive device.  Pt. Demonstrates ability to ambulate without assistive device and CGA safely.  Pt will continue to benefit from skilled PT services to improve her ambulation and function with using a less restrictive assistive device (quad cane) and return her to her PLOF.   OBJECTIVE IMPAIRMENTS: Abnormal gait, decreased activity tolerance, decreased balance, decreased endurance, decreased mobility, difficulty walking, decreased ROM, decreased strength, decreased safety awareness, improper body mechanics, postural dysfunction, and pain.   ACTIVITY LIMITATIONS: carrying, lifting, standing, squatting, stairs, transfers, bed mobility, bathing, toileting, dressing, and locomotion level  PARTICIPATION LIMITATIONS: meal prep, cleaning, laundry, shopping, community  activity, and church  PERSONAL FACTORS: Fitness and Past/current experiences are  also affecting patient's functional outcome.   REHAB POTENTIAL: Good  CLINICAL DECISION MAKING: Evolving/moderate complexity  EVALUATION COMPLEXITY: Moderate   GOALS: Goals reviewed with patient? Yes  LONG TERM GOALS: Target date: 02/01/23  Pt. Will increase FOTO to 46 to improve pain-free mobility.   Baseline: initial FOTO 29; 11/11: 60 Goal status: GOAL MET   2.  Pt. Will increase B LE muscle strength 1/2 muscle grade to improve pain-free mobility/ walking.   Baseline:  See above, 10/22: see above for updated scores Goal status: Partially met (see above for updated MMT)  3.  Pt. Able to ambulate 200 feet with mod. I with least assistive device to improve return to home.   Baseline: amb. Short distances with RW/ min. A from PT.  Limited endurance; 10/22: 424 feet with RW during Goal status: GOAL MET (10/22) 4. Pt will increase by at least 75m (133ft) when using LRAD (quad cane) in order to demonstrate clinically significant improvement in cardiopulmonary endurance and community ambulation  Baseline: 140 feet with quad cane; 11/11: 305 feet with quad cane  Goal status: GOAL MET (11/11)  5. Pt will be demonstrate ascending/descending at least 4 stairs with reciprocal pattern to improve independence with community mobility.  Baseline: step-to pattern with BUE assist  Goal status: initial  6. Pt will be able to ambulate at least 500 feet during while using the QC in order to show improvement in walking tolerance and community mobility.  Baseline: 11/11: 305 feet with QC  Goal status: not met, progressing    PLAN:  PT FREQUENCY: 2x/week  PT DURATION: 6 weeks  PLANNED INTERVENTIONS: Therapeutic exercises, Therapeutic activity, Neuromuscular re-education, Balance training, Gait training, Patient/Family education, Self Care, Joint mobilization, Stair training, Prosthetic training,  DME instructions, Manual therapy, and Re-evaluation  PLAN FOR NEXT SESSION: Progress gait training to use of QC/ no assistive device with mod. I.  LE strengthening/ balance training.  CHECK GOALS/ RECERT next tx. Session.   Cammie Mcgee, PT, DPT # [phone] Physical Therapist - [place]  [facility]  7:59 PM,01/31/23

## 2023-02-12 ENCOUNTER — Ambulatory Visit: Payer: Medicare HMO | Admitting: Physical Therapy

## 2023-02-12 ENCOUNTER — Encounter: Payer: Self-pay | Admitting: Physical Therapy

## 2023-02-12 DIAGNOSIS — R262 Difficulty in walking, not elsewhere classified: Secondary | ICD-10-CM

## 2023-02-12 DIAGNOSIS — Z89511 Acquired absence of right leg below knee: Secondary | ICD-10-CM

## 2023-02-12 DIAGNOSIS — R269 Unspecified abnormalities of gait and mobility: Secondary | ICD-10-CM

## 2023-02-12 DIAGNOSIS — M6281 Muscle weakness (generalized): Secondary | ICD-10-CM | POA: Diagnosis not present

## 2023-02-12 DIAGNOSIS — R2681 Unsteadiness on feet: Secondary | ICD-10-CM | POA: Diagnosis not present

## 2023-02-12 NOTE — Therapy (Signed)
 OUTPATIENT PHYSICAL THERAPY LOWER EXTREMITY TREATMENT/ RECERTIFICATION  Patient Name: Melanie Juarez MRN: [id number] DOB:07/10/1945, 77 y.o., female Today's Date: 02/12/2023  END OF SESSION:  PT End of Session - 02/12/23 1307     Visit Number 37    Number of Visits 49    Date for PT Re-Evaluation 03/26/23    PT Start Time 1303    PT Stop Time 1348    PT Time Calculation (min) 45 min    Equipment Utilized During Treatment Gait belt    Activity Tolerance Patient tolerated treatment well    Behavior During Therapy WFL for tasks assessed/performed             Past Medical History:  Diagnosis Date   Diabetes (HCC)    Hypertension    Past Surgical History:  Procedure Laterality Date   AMPUTATION Right 03/22/2022   Procedure: AMPUTATION BELOW KNEE-Guillotine;  Surgeon: Marea Selinda RAMAN, MD;  Location: [facility];  Service: Vascular;  Laterality: Right;   LOWER EXTREMITY ANGIOGRAPHY Right 03/21/2022   Procedure: Lower Extremity Angiography;  Surgeon: Marea Selinda RAMAN, MD;  Location: [facility];  Service: Cardiovascular;  Laterality: Right;   STUMP REVISION Right 03/26/2022   Procedure: BKA REVISION;  Surgeon: Marea Selinda RAMAN, MD;  Location: [facility];  Service: General;  Laterality: Right;   Patient Active Problem List   Diagnosis Date Noted   Hx of BKA, right (HCC) 08/08/2022   Lactic acidosis 03/21/2022   Obesity (BMI 30-39.9) 03/21/2022   PAD (peripheral artery disease) (HCC) 03/21/2022   Osteomyelitis (HCC) 03/19/2022   Diabetic infection of right foot (HCC) 03/19/2022   Essential hypertension 03/19/2022   Leukocytosis 03/19/2022   AKI (acute kidney injury) (HCC) 03/19/2022   Sepsis with acute renal failure without septic shock (HCC) 03/19/2022   Hyperlipidemia 05/17/2014   Type 2 diabetes mellitus (HCC) 05/17/2014   Anemia, unspecified 05/18/2013   Benign essential hypertension 05/18/2013   REFERRING PROVIDER: Marea Selinda RAMAN, MD  REFERRING DIAG: s/p R BKA  THERAPY DIAG:   Difficulty in walking, not elsewhere classified  Hx of BKA, right (HCC)  Muscle weakness (generalized)  Unsteadiness on feet  Gait difficulty  Rationale for Evaluation and Treatment: Rehabilitation  ONSET DATE: 03/22/22  SUBJECTIVE:   SUBJECTIVE STATEMENT: Pt. Had a sore on R foot resulting in R BKA on 03/22/22.  Pt. Entered Pt with use of w/c and daughter Dorma) present.  Pt. Wants to return to living independently.  Pt. Completing HEP from rehab center.  Pt. Has been unable to get into shower.    PERTINENT HISTORY: Melanie Juarez is a 77 y.o. ([birth year]) female who presents with chief complaint of     Chief Complaint  Patient presents with   Venous Insufficiency  .   History of Present Illness: Patient returns today in follow up of her right BKA.  It is healed.  She is in the process of getting her prosthesis now.  She is doing well.  No pain.  No left leg ulceration or rest pain.    Assessment/Plan   PAD (peripheral artery disease) (HCC) Check left ABI on follow up visit in 3 months.    Type 2 diabetes mellitus (HCC) blood glucose control important in reducing the progression of atherosclerotic disease. Also, involved in wound healing. On appropriate medications.    Essential hypertension blood pressure control important in reducing the progression of atherosclerotic disease. On appropriate oral medications.    Hx of BKA, right (HCC) Healed.  In the process of getting her prosthesis.  A new prescription given for physical therapy for prosthesis and ambulatory training.   Selinda Gu, MD   08/08/2022 10:42 AM  PAIN:  Are you having pain? No.  Pt. Reports phantom limb symptoms but not pain.    PRECAUTIONS: Fall  RED FLAGS: None   WEIGHT BEARING RESTRICTIONS: No  FALLS:  Has patient fallen in last 6 months? No  LIVING ENVIRONMENT: Lives with: lives with their daughter  Dorma).   Lives in: House/apartment Stairs: No Has following equipment at home: Vannie  - 2 wheeled, Wheelchair (manual), and Ramped entry  OCCUPATION: Retired  PLOF: Independent.  Pt. Does not drive.    PATIENT GOALS: Ambulate with least assistive device.  Return home independently/ go back to church.    NEXT MD VISIT: Garrel Mulch 7/31.  MD f/u in September?  OBJECTIVE:   PATIENT SURVEYS:  FOTO initial 29/ goal 24.    COGNITION: Overall cognitive status: Within functional limits for tasks assessed   SENSATION: WFL  EDEMA:  No R residual limb swelling noted.  PT discussed use of shrinker and proper use of ply socks for improved fit of prosthetic leg.    POSTURE: rounded shoulders and forward head  PALPATION: Minimal tenderness along R distal residual limb/ incision.  Good incision healing/ PT discussed benefits of scar massage.    LOWER EXTREMITY ROM:  Active ROM Right eval Left eval  Hip flexion [facility] WFL  Hip extension NT NT  Hip abduction [facility]  Hip adduction    Hip internal rotation    Hip external rotation    Knee flexion [facility] WFL  Knee extension -3 deg. 0 deg.  Ankle dorsiflexion    Ankle plantarflexion    Ankle inversion    Ankle eversion     (Blank rows = not tested)  LOWER EXTREMITY MMT:  MMT Right eval Left eval  Hip flexion 4/5 4/5  Hip extension    Hip abduction 3+/5 4/5  Hip adduction    Hip internal rotation    Hip external rotation    Knee flexion 4+/5  4+/5  Knee extension 4+/5 5/5  Ankle dorsiflexion    Ankle plantarflexion    Ankle inversion    Ankle eversion     (Blank rows = not tested)   B UE AROM WFL.  B UE strength grossly 4/5 MMT except bicep/tricep 4+/5 MMT  FUNCTIONAL TESTS:  5 times sit to stand: TBD Timed up and go (TUG): TBD 6 minute walk test: TBD  GAIT: Distance walked: 45 feet in gym with RW/ min. A for safety and cuing Assistive device utilized: Walker - 2 wheeled Level of assistance: Min A Comments: amb. In gym with use of RW/min A for cuing to increase step length/ heel strike.  Pt. Fearful  while walking outside of //-bars.  Pt. Ambs. In //-bars with B UE assist and increase cadence/ step pattern.  Use of mirror for posture correction/ heel strike (pt. Does not looking at a mirror).    : 305 feet with SPC - pt's prosthetic started sliding into extreme ER, time paused briefly half-way through to allow pt time to reapply the leg  FOTO: 60 (goal met)  TODAY'S TREATMENT:  DATE: 02/12/2023    Subjective:  Pt. entered PT gym with use of small base QC and prosthetic leg donned.  Pt. Reports no new complaints over Christmas holiday.  Pt. Still has difficulty with vacuuming and pts. Dtr. Has to assist with house cleaning.  Pt. Received a vacuum for Christmas present.  Pt. Has cataract surgery next month.    Treatment:   There.ex.:   Nustep B UE/LE level 4 (seat 10) x 10 minutes to improve cardiorespiratory endurance.  Discussed weekend activities/vacuuming.    Standing LE ex. (2#): seated LAQ/ hip flexion/ abduction/ extension 20x at //-bars with UE assist required on //-bars, esp. During L hip ROM.    Neuro mm.:  Walking in //-bars (Forward/ backwards/ lateral walking in //-bars )- 4 laps each (light to no UE assit).  High marching.  Improve LE control without UE assist.  Use of mirror for posture correction.  Pt. Tends to look down.   2# ankle wts.:  6 step ups/ downs with 1 UE assist/ progressing to no UE assist.    Ascending/descending 4 stairs x 2 with recip. Pattern with ascending and step to with descending with B UE assist.  Good control.    Walking in gym with use of QC only and focus on consistent recip. Gait pattern - 1 lap - pt demonstrated improved reciprocal pattern; still requires occasional VC's for increased step length with LLE.  Walking in clinic/ hallway/ outside to car with no assistive device and had to use QC for last 15 feet secondary  to fatigue/balance issues.        PATIENT EDUCATION:  Education details: Discussed HEP/ standing hip ex. At [facility] Person educated: Patient and Child(ren) Education method: Medical Illustrator Education comprehension: verbalized understanding and returned demonstration  HOME EXERCISE PROGRAM: Access Code: [email or web address] URL: [email or web address] Date: 08/30/2022 Prepared by: Maryanne Finder  Exercises - Sit to Stand with Counter Support  - 1 x daily - 5 x weekly - 3 sets - 10 reps - Standing Hip Abduction with Counter Support  - 1 x daily - 5 x weekly - 3 sets - 10 reps - Standing March with Counter Support  - 1 x daily - 5 x weekly - 3 sets - 10 reps - Standing Hip Extension with Counter Support  - 1 x daily - 5 x weekly - 3 sets - 10 reps  ASSESSMENT:  CLINICAL IMPRESSION:        Patient arrives to treatment session motivated to participate. Session focused on gait training with QC and balance/ LE strengthening. Continues to require mod. I and SBA with use of QC. Continues to show consistent progress towards goals.  Pt. Motivated to ambulate short distances around home with no assistive device.  Pt. Demonstrates ability to ambulate without assistive device and CGA safely.  Pt will continue to benefit from skilled PT services to improve her ambulation and function with using a less restrictive assistive device (quad cane) and return her to her PLOF.   OBJECTIVE IMPAIRMENTS: Abnormal gait, decreased activity tolerance, decreased balance, decreased endurance, decreased mobility, difficulty walking, decreased ROM, decreased strength, decreased safety awareness, improper body mechanics, postural dysfunction, and pain.   ACTIVITY LIMITATIONS: carrying, lifting, standing, squatting, stairs, transfers, bed mobility, bathing, toileting, dressing, and locomotion level  PARTICIPATION LIMITATIONS: meal prep, cleaning, laundry, shopping, community activity, and  church  PERSONAL FACTORS: Fitness and Past/current experiences are also affecting patient's functional outcome.   REHAB POTENTIAL: Good  CLINICAL DECISION MAKING: Evolving/moderate  complexity  EVALUATION COMPLEXITY: Moderate   GOALS: Goals reviewed with patient? Yes  LONG TERM GOALS: Target date: 03/26/23  Pt. Will increase FOTO to 46 to improve pain-free mobility.   Baseline: initial FOTO 29; 11/11: 60 Goal status: GOAL MET   2.  Pt. Will increase B LE muscle strength 1/2 muscle grade to improve pain-free mobility/ walking.   Baseline:  See above, 10/22: see above for updated scores Goal status: Partially met (see above for updated MMT)  3.  Pt. Able to ambulate 200 feet with mod. I with least assistive device to improve return to home.   Baseline: amb. Short distances with RW/ min. A from PT.  Limited endurance; 10/22: 424 feet with RW during Goal status: GOAL MET (10/22)  4. Pt will increase by at least 7m (142ft) when using LRAD (quad cane) in order to demonstrate clinically significant improvement in cardiopulmonary endurance and community ambulation  Baseline: 140 feet with quad cane; 11/11: 305 feet with quad cane  Goal status: GOAL MET (11/11)  5. Pt will be demonstrate ascending/descending at least 4 stairs with reciprocal pattern to improve independence with community mobility.  Baseline: step-to pattern with BUE assist  Goal status: Partially met  6. Pt will be able to ambulate at least 500 feet during while using the QC in order to show improvement in walking tolerance and community mobility.  Baseline: 11/11: 305 feet with QC.  12/31: 250 feet with QC and progressing to no assistive device for short distances.    Goal status: not met, progressing  7.  Pt. Able to vacuum living room with good LE control and no LOB to improve independence.    Baseline: pt. Received vacuum for Christmas but dtr. Continues to vacuum at this time.   Goal status:  initial  8.  Pt. Able to ambulate from living to kitchen/ bathroom with mod. I and no assistive safety to improve independence/ functional mobility.  Baseline: pt. Starting to amb. With CGA in clinic without QC  Goal status: initial  PLAN:  PT FREQUENCY: 2x/week  PT DURATION: 6 weeks  PLANNED INTERVENTIONS: Therapeutic exercises, Therapeutic activity, Neuromuscular re-education, Balance training, Gait training, Patient/Family education, Self Care, Joint mobilization, Stair training, Prosthetic training, DME instructions, Manual therapy, and Re-evaluation  PLAN FOR NEXT SESSION: Progress gait training to use of QC/ no assistive device with mod. I.  LE strengthening/ balance training.    Ozell JAYSON Sero, PT, DPT # [phone] Physical Therapist - [place]  [facility]  6:09 PM,02/12/23

## 2023-02-18 ENCOUNTER — Encounter: Payer: Self-pay | Admitting: Physical Therapy

## 2023-02-18 ENCOUNTER — Ambulatory Visit: Payer: Medicare Other | Attending: Vascular Surgery | Admitting: Physical Therapy

## 2023-02-18 DIAGNOSIS — Z89511 Acquired absence of right leg below knee: Secondary | ICD-10-CM | POA: Insufficient documentation

## 2023-02-18 DIAGNOSIS — M6281 Muscle weakness (generalized): Secondary | ICD-10-CM | POA: Diagnosis present

## 2023-02-18 DIAGNOSIS — R2681 Unsteadiness on feet: Secondary | ICD-10-CM | POA: Insufficient documentation

## 2023-02-18 DIAGNOSIS — R269 Unspecified abnormalities of gait and mobility: Secondary | ICD-10-CM | POA: Insufficient documentation

## 2023-02-18 DIAGNOSIS — R262 Difficulty in walking, not elsewhere classified: Secondary | ICD-10-CM | POA: Diagnosis present

## 2023-02-18 NOTE — Therapy (Signed)
 OUTPATIENT PHYSICAL THERAPY LOWER EXTREMITY TREATMENT  Patient Name: Melanie Juarez MRN: [id number] DOB:[birth year], 78 y.o., female Today's Date: 02/18/2023  END OF SESSION:  PT End of Session - 02/18/23 1309     Visit Number 38    Number of Visits 49    Date for PT Re-Evaluation 03/26/23    PT Start Time 1303    PT Stop Time 1350    PT Time Calculation (min) 47 min    Equipment Utilized During Treatment Gait belt    Activity Tolerance Patient tolerated treatment well    Behavior During Therapy WFL for tasks assessed/performed             Past Medical History:  Diagnosis Date   Diabetes (HCC)    Hypertension    Past Surgical History:  Procedure Laterality Date   AMPUTATION Right 03/22/2022   Procedure: AMPUTATION BELOW KNEE-Guillotine;  Surgeon: Marea Selinda RAMAN, MD;  Location: [facility];  Service: Vascular;  Laterality: Right;   LOWER EXTREMITY ANGIOGRAPHY Right 03/21/2022   Procedure: Lower Extremity Angiography;  Surgeon: Marea Selinda RAMAN, MD;  Location: [facility];  Service: Cardiovascular;  Laterality: Right;   STUMP REVISION Right 03/26/2022   Procedure: BKA REVISION;  Surgeon: Marea Selinda RAMAN, MD;  Location: [facility];  Service: General;  Laterality: Right;   Patient Active Problem List   Diagnosis Date Noted   Hx of BKA, right (HCC) 08/08/2022   Lactic acidosis 03/21/2022   Obesity (BMI 30-39.9) 03/21/2022   PAD (peripheral artery disease) (HCC) 03/21/2022   Osteomyelitis (HCC) 03/19/2022   Diabetic infection of right foot (HCC) 03/19/2022   Essential hypertension 03/19/2022   Leukocytosis 03/19/2022   AKI (acute kidney injury) (HCC) 03/19/2022   Sepsis with acute renal failure without septic shock (HCC) 03/19/2022   Hyperlipidemia 05/17/2014   Type 2 diabetes mellitus (HCC) 05/17/2014   Anemia, unspecified 05/18/2013   Benign essential hypertension 05/18/2013   REFERRING PROVIDER: Marea Selinda RAMAN, MD  REFERRING DIAG: s/p R BKA  THERAPY DIAG:  Difficulty in  walking, not elsewhere classified  Hx of BKA, right (HCC)  Muscle weakness (generalized)  Unsteadiness on feet  Gait difficulty  Rationale for Evaluation and Treatment: Rehabilitation  ONSET DATE: 03/22/22  SUBJECTIVE:   SUBJECTIVE STATEMENT: Pt. Had a sore on R foot resulting in R BKA on 03/22/22.  Pt. Entered Pt with use of w/c and daughter Dorma) present.  Pt. Wants to return to living independently.  Pt. Completing HEP from rehab center.  Pt. Has been unable to get into shower.    PERTINENT HISTORY: SANDE PICKERT is a 78 y.o. (1945-03-08) female who presents with chief complaint of     Chief Complaint  Patient presents with   Venous Insufficiency  .   History of Present Illness: Patient returns today in follow up of her right BKA.  It is healed.  She is in the process of getting her prosthesis now.  She is doing well.  No pain.  No left leg ulceration or rest pain.    Assessment/Plan   PAD (peripheral artery disease) (HCC) Check left ABI on follow up visit in 3 months.    Type 2 diabetes mellitus (HCC) blood glucose control important in reducing the progression of atherosclerotic disease. Also, involved in wound healing. On appropriate medications.    Essential hypertension blood pressure control important in reducing the progression of atherosclerotic disease. On appropriate oral medications.    Hx of BKA, right (HCC) Healed.  In the process of getting her prosthesis.  A new prescription given for physical therapy for prosthesis and ambulatory training.   Selinda Gu, MD   08/08/2022 10:42 AM  PAIN:  Are you having pain? No.  Pt. Reports phantom limb symptoms but not pain.    PRECAUTIONS: Fall  RED FLAGS: None   WEIGHT BEARING RESTRICTIONS: No  FALLS:  Has patient fallen in last 6 months? No  LIVING ENVIRONMENT: Lives with: lives with their daughter  Dorma).   Lives in: House/apartment Stairs: No Has following equipment at home: Vannie - 2 wheeled,  Wheelchair (manual), and Ramped entry  OCCUPATION: Retired  PLOF: Independent.  Pt. Does not drive.    PATIENT GOALS: Ambulate with least assistive device.  Return home independently/ go back to church.    NEXT MD VISIT: Garrel Mulch 7/31.  MD f/u in September?  OBJECTIVE:   PATIENT SURVEYS:  FOTO initial 29/ goal 85.    COGNITION: Overall cognitive status: Within functional limits for tasks assessed   SENSATION: WFL  EDEMA:  No R residual limb swelling noted.  PT discussed use of shrinker and proper use of ply socks for improved fit of prosthetic leg.    POSTURE: rounded shoulders and forward head  PALPATION: Minimal tenderness along R distal residual limb/ incision.  Good incision healing/ PT discussed benefits of scar massage.    LOWER EXTREMITY ROM:  Active ROM Right eval Left eval  Hip flexion [facility] WFL  Hip extension NT NT  Hip abduction [facility]  Hip adduction    Hip internal rotation    Hip external rotation    Knee flexion [facility] WFL  Knee extension -3 deg. 0 deg.  Ankle dorsiflexion    Ankle plantarflexion    Ankle inversion    Ankle eversion     (Blank rows = not tested)  LOWER EXTREMITY MMT:  MMT Right eval Left eval  Hip flexion 4/5 4/5  Hip extension    Hip abduction 3+/5 4/5  Hip adduction    Hip internal rotation    Hip external rotation    Knee flexion 4+/5  4+/5  Knee extension 4+/5 5/5  Ankle dorsiflexion    Ankle plantarflexion    Ankle inversion    Ankle eversion     (Blank rows = not tested)   B UE AROM WFL.  B UE strength grossly 4/5 MMT except bicep/tricep 4+/5 MMT  FUNCTIONAL TESTS:  5 times sit to stand: TBD Timed up and go (TUG): TBD 6 minute walk test: TBD  GAIT: Distance walked: 45 feet in gym with RW/ min. A for safety and cuing Assistive device utilized: Walker - 2 wheeled Level of assistance: Min A Comments: amb. In gym with use of RW/min A for cuing to increase step length/ heel strike.  Pt. Fearful while walking  outside of //-bars.  Pt. Ambs. In //-bars with B UE assist and increase cadence/ step pattern.  Use of mirror for posture correction/ heel strike (pt. Does not looking at a mirror).    : 305 feet with SPC - pt's prosthetic started sliding into extreme ER, time paused briefly half-way through to allow pt time to reapply the leg  FOTO: 60 (goal met)  TODAY'S TREATMENT:  DATE: 02/18/2023    Subjective:  Pt. entered PT gym with use of small base QC and prosthetic leg donned.  Pt. States she is having some GI issues today.    Treatment:   There.ex.:   Nustep B UE/LE level 4 (seat 10) x 10 minutes to improve cardiorespiratory endurance.  Discussed weekend activities/vacuuming.    Standing LE ex. (3#): seated LAQ/ hip flexion/ abduction/ extension/ IR/ ER 20x2 at //-bars with UE assist required on //-bars, esp. During L hip ROM.    Neuro mm.:  Walking in //-bars with high marching.  Improved LE control without UE assist.  Use of mirror for posture correction.  Pt. Tends to look down.   Ascending/descending 4 stairs x 2 with recip. Pattern with ascending and step to with descending with B UE assist.  Good control.    Walking in gym with use of QC only and focus on consistent recip. Gait pattern - 1 lap - pt demonstrated improved reciprocal pattern; still requires occasional VC's for increased step length with LLE.  Walking in clinic/ hallway/ outside to car with no assistive device.   PATIENT EDUCATION:  Education details: Discussed HEP/ standing hip ex. At [employer] Person educated: Patient and Child(ren) Education method: Medical Illustrator Education comprehension: verbalized understanding and returned demonstration  HOME EXERCISE PROGRAM: Access Code: [email or web address] URL: [email or web address] Date: 08/30/2022 Prepared by: Maryanne Finder  Exercises - Sit to Stand with Counter Support  - 1 x daily - 5 x weekly - 3 sets - 10 reps - Standing Hip Abduction with Counter Support  - 1 x daily - 5 x weekly - 3 sets - 10 reps - Standing March with Counter Support  - 1 x daily - 5 x weekly - 3 sets - 10 reps - Standing Hip Extension with Counter Support  - 1 x daily - 5 x weekly - 3 sets - 10 reps  ASSESSMENT:  CLINICAL IMPRESSION:        Patient arrives to treatment session motivated to participate. Session focused on gait training with QC and balance/ LE strengthening. Continues to require mod. I and SBA with use of QC. Continues to show consistent progress towards goals.  Pt. Motivated to ambulate short distances around home with no assistive device.  Pt. Demonstrates ability to ambulate without assistive device and CGA safely.  Pt will continue to benefit from skilled PT services to improve her ambulation and function with using a less restrictive assistive device (quad cane) and return her to her PLOF.   OBJECTIVE IMPAIRMENTS: Abnormal gait, decreased activity tolerance, decreased balance, decreased endurance, decreased mobility, difficulty walking, decreased ROM, decreased strength, decreased safety awareness, improper body mechanics, postural dysfunction, and pain.   ACTIVITY LIMITATIONS: carrying, lifting, standing, squatting, stairs, transfers, bed mobility, bathing, toileting, dressing, and locomotion level  PARTICIPATION LIMITATIONS: meal prep, cleaning, laundry, shopping, community activity, and church  PERSONAL FACTORS: Fitness and Past/current experiences are also affecting patient's functional outcome.   REHAB POTENTIAL: Good  CLINICAL DECISION MAKING: Evolving/moderate complexity  EVALUATION COMPLEXITY: Moderate   GOALS: Goals reviewed with patient? Yes  LONG TERM GOALS: Target date: 03/26/23  Pt. Will increase FOTO to 46 to improve pain-free mobility.   Baseline: initial FOTO 29; 11/11: 60 Goal  status: GOAL MET   2.  Pt. Will increase B LE muscle strength 1/2 muscle grade to improve pain-free mobility/ walking.   Baseline:  See above, 10/22: see above for updated scores Goal status: Partially met (see above for  updated MMT)  3.  Pt. Able to ambulate 200 feet with mod. I with least assistive device to improve return to home.   Baseline: amb. Short distances with RW/ min. A from PT.  Limited endurance; 10/22: 424 feet with RW during Goal status: GOAL MET (10/22)  4. Pt will increase by at least 66m (11ft) when using LRAD (quad cane) in order to demonstrate clinically significant improvement in cardiopulmonary endurance and community ambulation  Baseline: 140 feet with quad cane; 11/11: 305 feet with quad cane  Goal status: GOAL MET (11/11)  5. Pt will be demonstrate ascending/descending at least 4 stairs with reciprocal pattern to improve independence with community mobility.  Baseline: step-to pattern with BUE assist  Goal status: Partially met  6. Pt will be able to ambulate at least 500 feet during while using the QC in order to show improvement in walking tolerance and community mobility.  Baseline: 11/11: 305 feet with QC.  12/31: 250 feet with QC and progressing to no assistive device for short distances.    Goal status: not met, progressing  7.  Pt. Able to vacuum living room with good LE control and no LOB to improve independence.    Baseline: pt. Received vacuum for Christmas but dtr. Continues to vacuum at this time.   Goal status: initial  8.  Pt. Able to ambulate from living to kitchen/ bathroom with mod. I and no assistive safety to improve independence/ functional mobility.  Baseline: pt. Starting to amb. With CGA in clinic without QC  Goal status: initial  PLAN:  PT FREQUENCY: 2x/week  PT DURATION: 6 weeks  PLANNED INTERVENTIONS: Therapeutic exercises, Therapeutic activity, Neuromuscular re-education, Balance training, Gait training,  Patient/Family education, Self Care, Joint mobilization, Stair training, Prosthetic training, DME instructions, Manual therapy, and Re-evaluation  PLAN FOR NEXT SESSION: Progress gait training to use of QC/ no assistive device with mod. I.  LE strengthening/ balance training.    Ozell JAYSON Sero, PT, DPT # [phone] Physical Therapist - [place]  [facility]  6:39 PM,02/18/23

## 2023-02-20 ENCOUNTER — Encounter: Payer: Self-pay | Admitting: Physical Therapy

## 2023-02-20 ENCOUNTER — Ambulatory Visit: Payer: Medicare Other | Admitting: Physical Therapy

## 2023-02-20 DIAGNOSIS — R2681 Unsteadiness on feet: Secondary | ICD-10-CM

## 2023-02-20 DIAGNOSIS — R269 Unspecified abnormalities of gait and mobility: Secondary | ICD-10-CM

## 2023-02-20 DIAGNOSIS — M6281 Muscle weakness (generalized): Secondary | ICD-10-CM

## 2023-02-20 DIAGNOSIS — R262 Difficulty in walking, not elsewhere classified: Secondary | ICD-10-CM

## 2023-02-20 DIAGNOSIS — Z89511 Acquired absence of right leg below knee: Secondary | ICD-10-CM

## 2023-02-20 NOTE — Therapy (Signed)
 OUTPATIENT PHYSICAL THERAPY LOWER EXTREMITY TREATMENT  Patient Name: VERNESSA LIKES MRN: [id number] DOB:Feb 28, 1945, 78 y.o., female Today's Date: 02/20/2023  END OF SESSION:  PT End of Session - 02/20/23 1311     Visit Number 39    Number of Visits 49    Date for PT Re-Evaluation 03/26/23    PT Start Time 1308    PT Stop Time 1354    PT Time Calculation (min) 46 min    Equipment Utilized During Treatment Gait belt    Activity Tolerance Patient tolerated treatment well    Behavior During Therapy WFL for tasks assessed/performed             Past Medical History:  Diagnosis Date   Diabetes (HCC)    Hypertension    Past Surgical History:  Procedure Laterality Date   AMPUTATION Right 03/22/2022   Procedure: AMPUTATION BELOW KNEE-Guillotine;  Surgeon: Marea Selinda RAMAN, MD;  Location: [facility];  Service: Vascular;  Laterality: Right;   LOWER EXTREMITY ANGIOGRAPHY Right 03/21/2022   Procedure: Lower Extremity Angiography;  Surgeon: Marea Selinda RAMAN, MD;  Location: [facility];  Service: Cardiovascular;  Laterality: Right;   STUMP REVISION Right 03/26/2022   Procedure: BKA REVISION;  Surgeon: Marea Selinda RAMAN, MD;  Location: [facility];  Service: General;  Laterality: Right;   Patient Active Problem List   Diagnosis Date Noted   Hx of BKA, right (HCC) 08/08/2022   Lactic acidosis 03/21/2022   Obesity (BMI 30-39.9) 03/21/2022   PAD (peripheral artery disease) (HCC) 03/21/2022   Osteomyelitis (HCC) 03/19/2022   Diabetic infection of right foot (HCC) 03/19/2022   Essential hypertension 03/19/2022   Leukocytosis 03/19/2022   AKI (acute kidney injury) (HCC) 03/19/2022   Sepsis with acute renal failure without septic shock (HCC) 03/19/2022   Hyperlipidemia 05/17/2014   Type 2 diabetes mellitus (HCC) 05/17/2014   Anemia, unspecified 05/18/2013   Benign essential hypertension 05/18/2013   REFERRING PROVIDER: Marea Selinda RAMAN, MD  REFERRING DIAG: s/p R BKA  THERAPY DIAG:  Difficulty in  walking, not elsewhere classified  Hx of BKA, right (HCC)  Muscle weakness (generalized)  Unsteadiness on feet  Gait difficulty  Rationale for Evaluation and Treatment: Rehabilitation  ONSET DATE: 03/22/22  SUBJECTIVE:   SUBJECTIVE STATEMENT: Pt. Had a sore on R foot resulting in R BKA on 03/22/22.  Pt. Entered Pt with use of w/c and daughter Dorma) present.  Pt. Wants to return to living independently.  Pt. Completing HEP from rehab center.  Pt. Has been unable to get into shower.    PERTINENT HISTORY: TAMLA WINKELS is a 78 y.o. (31-May-1945) female who presents with chief complaint of     Chief Complaint  Patient presents with   Venous Insufficiency  .   History of Present Illness: Patient returns today in follow up of her right BKA.  It is healed.  She is in the process of getting her prosthesis now.  She is doing well.  No pain.  No left leg ulceration or rest pain.    Assessment/Plan   PAD (peripheral artery disease) (HCC) Check left ABI on follow up visit in 3 months.    Type 2 diabetes mellitus (HCC) blood glucose control important in reducing the progression of atherosclerotic disease. Also, involved in wound healing. On appropriate medications.    Essential hypertension blood pressure control important in reducing the progression of atherosclerotic disease. On appropriate oral medications.    Hx of BKA, right (HCC) Healed.  In the process of getting her prosthesis.  A new prescription given for physical therapy for prosthesis and ambulatory training.   Selinda Gu, MD   08/08/2022 10:42 AM  PAIN:  Are you having pain? No.  Pt. Reports phantom limb symptoms but not pain.    PRECAUTIONS: Fall  RED FLAGS: None   WEIGHT BEARING RESTRICTIONS: No  FALLS:  Has patient fallen in last 6 months? No  LIVING ENVIRONMENT: Lives with: lives with their daughter  Dorma).   Lives in: House/apartment Stairs: No Has following equipment at home: Vannie - 2 wheeled,  Wheelchair (manual), and Ramped entry  OCCUPATION: Retired  PLOF: Independent.  Pt. Does not drive.    PATIENT GOALS: Ambulate with least assistive device.  Return home independently/ go back to church.    NEXT MD VISIT: Garrel Mulch 7/31.  MD f/u in September?  OBJECTIVE:   PATIENT SURVEYS:  FOTO initial 29/ goal 28.    COGNITION: Overall cognitive status: Within functional limits for tasks assessed   SENSATION: WFL  EDEMA:  No R residual limb swelling noted.  PT discussed use of shrinker and proper use of ply socks for improved fit of prosthetic leg.    POSTURE: rounded shoulders and forward head  PALPATION: Minimal tenderness along R distal residual limb/ incision.  Good incision healing/ PT discussed benefits of scar massage.    LOWER EXTREMITY ROM:  Active ROM Right eval Left eval  Hip flexion [facility] WFL  Hip extension NT NT  Hip abduction [facility]  Hip adduction    Hip internal rotation    Hip external rotation    Knee flexion [facility] WFL  Knee extension -3 deg. 0 deg.  Ankle dorsiflexion    Ankle plantarflexion    Ankle inversion    Ankle eversion     (Blank rows = not tested)  LOWER EXTREMITY MMT:  MMT Right eval Left eval  Hip flexion 4/5 4/5  Hip extension    Hip abduction 3+/5 4/5  Hip adduction    Hip internal rotation    Hip external rotation    Knee flexion 4+/5  4+/5  Knee extension 4+/5 5/5  Ankle dorsiflexion    Ankle plantarflexion    Ankle inversion    Ankle eversion     (Blank rows = not tested)   B UE AROM WFL.  B UE strength grossly 4/5 MMT except bicep/tricep 4+/5 MMT  FUNCTIONAL TESTS:  5 times sit to stand: TBD Timed up and go (TUG): TBD 6 minute walk test: TBD  GAIT: Distance walked: 45 feet in gym with RW/ min. A for safety and cuing Assistive device utilized: Walker - 2 wheeled Level of assistance: Min A Comments: amb. In gym with use of RW/min A for cuing to increase step length/ heel strike.  Pt. Fearful while walking  outside of //-bars.  Pt. Ambs. In //-bars with B UE assist and increase cadence/ step pattern.  Use of mirror for posture correction/ heel strike (pt. Does not looking at a mirror).    : 305 feet with SPC - pt's prosthetic started sliding into extreme ER, time paused briefly half-way through to allow pt time to reapply the leg  FOTO: 60 (goal met)  TODAY'S TREATMENT:  DATE: 02/20/2023    Subjective:  Pt. entered PT gym with use of small base QC and prosthetic leg donned.  Pt. Reports no pain in residual limb.  Pt. States she is wearing prosthetic leg all day.     Treatment:   There.ex.:   Nustep B UE/LE level 5 (seat 10) x 10 minutes to improve cardiorespiratory endurance.  Discussed weekend activities/vacuuming.    Seated LAQ/ marching 20x.  Standing marching in //-bars 20x.    Neuro mm.:  Walking in //-bars with no UE assist 3 laps/ high marching.  Improved LE control without UE assist.  Use of mirror for posture correction.   Standing step lunges L/R with no UE assist in //-bars (forward/ lateral)- 20x each.    Functional reaching: cone cross body reaching 12x2.    Walking in gym with no assistive device and focus on consistent recip. Gait pattern - 1 lap - pt demonstrated improved reciprocal pattern; still requires occasional VC's for increased step length with LLE.  Walking in clinic/ hallway/ outside to car with no assistive device.   PATIENT EDUCATION:  Education details: Discussed HEP/ standing hip ex. At [facility] Person educated: Patient and Child(ren) Education method: Medical Illustrator Education comprehension: verbalized understanding and returned demonstration  HOME EXERCISE PROGRAM: Access Code: [email or web address] URL: [email or web address] Date: 08/30/2022 Prepared by: Maryanne Finder  Exercises - Sit to Stand with  Counter Support  - 1 x daily - 5 x weekly - 3 sets - 10 reps - Standing Hip Abduction with Counter Support  - 1 x daily - 5 x weekly - 3 sets - 10 reps - Standing March with Counter Support  - 1 x daily - 5 x weekly - 3 sets - 10 reps - Standing Hip Extension with Counter Support  - 1 x daily - 5 x weekly - 3 sets - 10 reps  ASSESSMENT:  CLINICAL IMPRESSION:        Pt. Arrives to treatment on time and prepared to participate. Pt. Demonstrated an increased willingness to challenge the use of their above knee prostheses during today's session. Pt. Showed consistent progress with ambulation without the use of QC. Pt. Demonstrated uncertainty with ambulation when adding multi-planar LE movements. Pt. Still requires moderate assistance when turning and during transfers. Pt. Demonstrates ability to ambulate without assistive device and CGA safely.  Pt. will continue to benefit from skilled PT services to improve her ambulation and function with using a less restrictive assistive device (quad cane) and return her to her PLOF.   OBJECTIVE IMPAIRMENTS: Abnormal gait, decreased activity tolerance, decreased balance, decreased endurance, decreased mobility, difficulty walking, decreased ROM, decreased strength, decreased safety awareness, improper body mechanics, postural dysfunction, and pain.   ACTIVITY LIMITATIONS: carrying, lifting, standing, squatting, stairs, transfers, bed mobility, bathing, toileting, dressing, and locomotion level  PARTICIPATION LIMITATIONS: meal prep, cleaning, laundry, shopping, community activity, and church  PERSONAL FACTORS: Fitness and Past/current experiences are also affecting patient's functional outcome.   REHAB POTENTIAL: Good  CLINICAL DECISION MAKING: Evolving/moderate complexity  EVALUATION COMPLEXITY: Moderate   GOALS: Goals reviewed with patient? Yes  LONG TERM GOALS: Target date: 03/26/23  Pt. Will increase FOTO to 46 to improve pain-free mobility.    Baseline: initial FOTO 29; 11/11: 60 Goal status: GOAL MET   2.  Pt. Will increase B LE muscle strength 1/2 muscle grade to improve pain-free mobility/ walking.   Baseline:  See above, 10/22: see above for updated scores Goal status: Partially met (see above for  updated MMT)  3.  Pt. Able to ambulate 200 feet with mod. I with least assistive device to improve return to home.   Baseline: amb. Short distances with RW/ min. A from PT.  Limited endurance; 10/22: 424 feet with RW during Goal status: GOAL MET (10/22)  4. Pt will increase by at least 25m (132ft) when using LRAD (quad cane) in order to demonstrate clinically significant improvement in cardiopulmonary endurance and community ambulation  Baseline: 140 feet with quad cane; 11/11: 305 feet with quad cane  Goal status: GOAL MET (11/11)  5. Pt will be demonstrate ascending/descending at least 4 stairs with reciprocal pattern to improve independence with community mobility.  Baseline: step-to pattern with BUE assist  Goal status: Partially met  6. Pt will be able to ambulate at least 500 feet during while using the QC in order to show improvement in walking tolerance and community mobility.  Baseline: 11/11: 305 feet with QC.  12/31: 250 feet with QC and progressing to no assistive device for short distances.    Goal status: not met, progressing  7.  Pt. Able to vacuum living room with good LE control and no LOB to improve independence.    Baseline: pt. Received vacuum for Christmas but dtr. Continues to vacuum at this time.   Goal status: initial  8.  Pt. Able to ambulate from living to kitchen/ bathroom with mod. I and no assistive safety to improve independence/ functional mobility.  Baseline: pt. Starting to amb. With CGA in clinic without QC  Goal status: initial  PLAN:  PT FREQUENCY: 2x/week  PT DURATION: 6 weeks  PLANNED INTERVENTIONS: Therapeutic exercises, Therapeutic activity, Neuromuscular  re-education, Balance training, Gait training, Patient/Family education, Self Care, Joint mobilization, Stair training, Prosthetic training, DME instructions, Manual therapy, and Re-evaluation  PLAN FOR NEXT SESSION: Progress gait training to use of QC/ no assistive device with mod. I.  LE strengthening/ balance training.    Ozell JAYSON Sero, PT, DPT # [phone] Physical Therapist - [place]  [facility]  1:54 PM,02/20/23

## 2023-02-25 ENCOUNTER — Ambulatory Visit: Payer: Medicare Other | Admitting: Physical Therapy

## 2023-02-25 ENCOUNTER — Encounter: Payer: Self-pay | Admitting: Physical Therapy

## 2023-02-25 DIAGNOSIS — R269 Unspecified abnormalities of gait and mobility: Secondary | ICD-10-CM

## 2023-02-25 DIAGNOSIS — R2681 Unsteadiness on feet: Secondary | ICD-10-CM

## 2023-02-25 DIAGNOSIS — Z89511 Acquired absence of right leg below knee: Secondary | ICD-10-CM

## 2023-02-25 DIAGNOSIS — R262 Difficulty in walking, not elsewhere classified: Secondary | ICD-10-CM

## 2023-02-25 DIAGNOSIS — M6281 Muscle weakness (generalized): Secondary | ICD-10-CM

## 2023-02-25 NOTE — Therapy (Signed)
 OUTPATIENT PHYSICAL THERAPY LOWER EXTREMITY TREATMENT Physical Therapy Progress Note  Dates of reporting period  01/04/23   to   02/25/23   Patient Name: Melanie Juarez MRN: [id number] DOB:06/13/1945, 78 y.o., female Today's Date: 02/25/2023  END OF SESSION:  PT End of Session - 02/25/23 1313     Visit Number 40    Number of Visits 49    Date for PT Re-Evaluation 03/26/23    PT Start Time 1310    PT Stop Time 1400    PT Time Calculation (min) 50 min    Equipment Utilized During Treatment Gait belt    Activity Tolerance Patient tolerated treatment well    Behavior During Therapy WFL for tasks assessed/performed             Past Medical History:  Diagnosis Date   Diabetes (HCC)    Hypertension    Past Surgical History:  Procedure Laterality Date   AMPUTATION Right 03/22/2022   Procedure: AMPUTATION BELOW KNEE-Guillotine;  Surgeon: Marea Selinda RAMAN, MD;  Location: [facility];  Service: Vascular;  Laterality: Right;   LOWER EXTREMITY ANGIOGRAPHY Right 03/21/2022   Procedure: Lower Extremity Angiography;  Surgeon: Marea Selinda RAMAN, MD;  Location: [facility];  Service: Cardiovascular;  Laterality: Right;   STUMP REVISION Right 03/26/2022   Procedure: BKA REVISION;  Surgeon: Marea Selinda RAMAN, MD;  Location: [facility];  Service: General;  Laterality: Right;   Patient Active Problem List   Diagnosis Date Noted   Hx of BKA, right (HCC) 08/08/2022   Lactic acidosis 03/21/2022   Obesity (BMI 30-39.9) 03/21/2022   PAD (peripheral artery disease) (HCC) 03/21/2022   Osteomyelitis (HCC) 03/19/2022   Diabetic infection of right foot (HCC) 03/19/2022   Essential hypertension 03/19/2022   Leukocytosis 03/19/2022   AKI (acute kidney injury) (HCC) 03/19/2022   Sepsis with acute renal failure without septic shock (HCC) 03/19/2022   Hyperlipidemia 05/17/2014   Type 2 diabetes mellitus (HCC) 05/17/2014   Anemia, unspecified 05/18/2013   Benign essential hypertension 05/18/2013   REFERRING  PROVIDER: Marea Selinda RAMAN, MD  REFERRING DIAG: s/p R BKA  THERAPY DIAG:  Difficulty in walking, not elsewhere classified  Hx of BKA, right (HCC)  Muscle weakness (generalized)  Unsteadiness on feet  Gait difficulty  Rationale for Evaluation and Treatment: Rehabilitation  ONSET DATE: 03/22/22  SUBJECTIVE:   SUBJECTIVE STATEMENT: Pt. Had a sore on R foot resulting in R BKA on 03/22/22.  Pt. Entered Pt with use of w/c and daughter Melanie Juarez) present.  Pt. Wants to return to living independently.  Pt. Completing HEP from rehab center.  Pt. Has been unable to get into shower.    PERTINENT HISTORY: [name] is a 78 y.o. ([birth year]) female who presents with chief complaint of     Chief Complaint  Patient presents with   Venous Insufficiency  .   History of Present Illness: Patient returns today in follow up of her right BKA.  It is healed.  She is in the process of getting her prosthesis now.  She is doing well.  No pain.  No left leg ulceration or rest pain.    Assessment/Plan   PAD (peripheral artery disease) (HCC) Check left ABI on follow up visit in 3 months.    Type 2 diabetes mellitus (HCC) blood glucose control important in reducing the progression of atherosclerotic disease. Also, involved in wound healing. On appropriate medications.    Essential hypertension blood pressure control important in reducing the  progression of atherosclerotic disease. On appropriate oral medications.    Hx of BKA, right (HCC) Healed.  In the process of getting her prosthesis.  A new prescription given for physical therapy for prosthesis and ambulatory training.   Selinda Gu, MD   08/08/2022 10:42 AM  PAIN:  Are you having pain? No.  Pt. Reports phantom limb symptoms but not pain.    PRECAUTIONS: Fall  RED FLAGS: None   WEIGHT BEARING RESTRICTIONS: No  FALLS:  Has patient fallen in last 6 months? No  LIVING ENVIRONMENT: Lives with: lives with their daughter  Melanie Juarez).   Lives  in: House/apartment Stairs: No Has following equipment at home: Vannie - 2 wheeled, Wheelchair (manual), and Ramped entry  OCCUPATION: Retired  PLOF: Independent.  Pt. Does not drive.    PATIENT GOALS: Ambulate with least assistive device.  Return home independently/ go back to church.    NEXT MD VISIT: Garrel Mulch 7/31.  MD f/u in September?  OBJECTIVE:   PATIENT SURVEYS:  FOTO initial 29/ goal 69.    COGNITION: Overall cognitive status: Within functional limits for tasks assessed   SENSATION: WFL  EDEMA:  No R residual limb swelling noted.  PT discussed use of shrinker and proper use of ply socks for improved fit of prosthetic leg.    POSTURE: rounded shoulders and forward head  PALPATION: Minimal tenderness along R distal residual limb/ incision.  Good incision healing/ PT discussed benefits of scar massage.    LOWER EXTREMITY ROM:  Active ROM Right eval Left eval  Hip flexion [facility] WFL  Hip extension NT NT  Hip abduction [facility]  Hip adduction    Hip internal rotation    Hip external rotation    Knee flexion [facility] WFL  Knee extension -3 deg. 0 deg.  Ankle dorsiflexion    Ankle plantarflexion    Ankle inversion    Ankle eversion     (Blank rows = not tested)  LOWER EXTREMITY MMT:  MMT Right eval Left eval  Hip flexion 4/5 4/5  Hip extension    Hip abduction 3+/5 4/5  Hip adduction    Hip internal rotation    Hip external rotation    Knee flexion 4+/5  4+/5  Knee extension 4+/5 5/5  Ankle dorsiflexion    Ankle plantarflexion    Ankle inversion    Ankle eversion     (Blank rows = not tested)   B UE AROM WFL.  B UE strength grossly 4/5 MMT except bicep/tricep 4+/5 MMT  FUNCTIONAL TESTS:  5 times sit to stand: TBD Timed up and go (TUG): TBD 6 minute walk test: TBD  GAIT: Distance walked: 45 feet in gym with RW/ min. A for safety and cuing Assistive device utilized: Walker - 2 wheeled Level of assistance: Min A Comments: amb. In gym with use of  RW/min A for cuing to increase step length/ heel strike.  Pt. Fearful while walking outside of //-bars.  Pt. Ambs. In //-bars with B UE assist and increase cadence/ step pattern.  Use of mirror for posture correction/ heel strike (pt. Does not looking at a mirror).    : 305 feet with SPC - pt's prosthetic started sliding into extreme ER, time paused briefly half-way through to allow pt time to reapply the leg  FOTO: 60 (goal met)  TODAY'S TREATMENT:  DATE: 02/25/2023    Subjective:  Pt. entered PT gym with use of small base QC and prosthetic leg donned.  Pt. Reports no pain in residual limb.  Pt. States she is wearing prosthetic leg all day.  No new complaints.    Treatment:   Neuro mm.:  Walking in //-bars with min. to no UE assist 6 laps/ high marching.  Improved LE control without UE assist.  Use of mirror for posture correction.   Blaze pod functional reaching (6 pods on random setting)- 4x (2x on L UE/R UE each).  RPE: 5.    Standing step lunges L/R with no UE assist in //-bars (forward/ lateral/ backwards)- 4 laps each.     Walking in gym with no assistive device and focus on consistent recip. Gait pattern - 1 lap - pt demonstrated reciprocal pattern; still requires occasional VC's for increased step length with LLE.  Walking in clinic/ hallway/ outside to car with QC progressing to no assistive device.  There.ex.:   Nustep B UE/LE level 5 (seat 10) x 10 minutes to improve cardiorespiratory endurance.  Discussed weekend activities/vacuuming.    Seated marching/ abduction B with RTB resistance 15x each.  Issued RTB  PATIENT EDUCATION:  Education details: Discussed HEP/ standing hip ex. At [employer] Person educated: Patient and Child(ren) Education method: Medical Illustrator Education comprehension: verbalized understanding and returned  demonstration  HOME EXERCISE PROGRAM: Access Code: [email or web address] URL: [email or web address] Date: 08/30/2022 Prepared by: Maryanne Finder  Exercises - Sit to Stand with Counter Support  - 1 x daily - 5 x weekly - 3 sets - 10 reps - Standing Hip Abduction with Counter Support  - 1 x daily - 5 x weekly - 3 sets - 10 reps - Standing March with Counter Support  - 1 x daily - 5 x weekly - 3 sets - 10 reps - Standing Hip Extension with Counter Support  - 1 x daily - 5 x weekly - 3 sets - 10 reps  ASSESSMENT:  CLINICAL IMPRESSION:        Pt. Arrived to treatment session ready to participate and demonstrated less B antalgic gait. NPS upon arrival was a 0/10 and remained at a 0/10 throughout treatment. Pt. Improved with ambulation activities including side-stepping and recovery steps within the //-bars and progressed to using only one hand for assistance during session. Pt. Showed improvements with forward and backwards walking without AD, only requiring min. Assist. In //-bars. Pt. Verbalized increased confidence with prosthesis and decreased instances of pain limiting movement. Pt. will continue to benefit from skilled PT services to improve her ambulation and function with using a less restrictive assistive device (quad cane) and return her to her PLOF.    OBJECTIVE IMPAIRMENTS: Abnormal gait, decreased activity tolerance, decreased balance, decreased endurance, decreased mobility, difficulty walking, decreased ROM, decreased strength, decreased safety awareness, improper body mechanics, postural dysfunction, and pain.   ACTIVITY LIMITATIONS: carrying, lifting, standing, squatting, stairs, transfers, bed mobility, bathing, toileting, dressing, and locomotion level  PARTICIPATION LIMITATIONS: meal prep, cleaning, laundry, shopping, community activity, and church  PERSONAL FACTORS: Fitness and Past/current experiences are also affecting patient's functional outcome.   REHAB POTENTIAL:  Good  CLINICAL DECISION MAKING: Evolving/moderate complexity  EVALUATION COMPLEXITY: Moderate   GOALS: Goals reviewed with patient? Yes  LONG TERM GOALS: Target date: 03/26/23  Pt. Will increase FOTO to 46 to improve pain-free mobility.   Baseline: initial FOTO 29; 11/11: 60 Goal status: GOAL MET   2.  Pt. Will  increase B LE muscle strength 1/2 muscle grade to improve pain-free mobility/ walking.   Baseline:  See above, 10/22: see above for updated scores Goal status: Partially met (see above for updated MMT)  3.  Pt. Able to ambulate 200 feet with mod. I with least assistive device to improve return to home.   Baseline: amb. Short distances with RW/ min. A from PT.  Limited endurance; 10/22: 424 feet with RW during Goal status: GOAL MET (10/22)  4. Pt will increase by at least 61m (137ft) when using LRAD (quad cane) in order to demonstrate clinically significant improvement in cardiopulmonary endurance and community ambulation  Baseline: 140 feet with quad cane; 11/11: 305 feet with quad cane  Goal status: GOAL MET (11/11)  5. Pt will be demonstrate ascending/descending at least 4 stairs with reciprocal pattern to improve independence with community mobility.  Baseline: step-to pattern with BUE assist  Goal status: Partially met  6. Pt will be able to ambulate at least 500 feet during while using the QC in order to show improvement in walking tolerance and community mobility.  Baseline: 11/11: 305 feet with QC.  12/31: 250 feet with QC and progressing to no assistive device for short distances.    Goal status: not met, progressing  7.  Pt. Able to vacuum living room with good LE control and no LOB to improve independence.    Baseline: pt. Received vacuum for Christmas but dtr. Continues to vacuum at this time.   Goal status: Not met  8.  Pt. Able to ambulate from living to kitchen/ bathroom with mod. I and no assistive safety to improve independence/ functional  mobility.  Baseline: pt. Starting to amb. With CGA in clinic without QC  Goal status: Not met  PLAN:  PT FREQUENCY: 2x/week  PT DURATION: 6 weeks  PLANNED INTERVENTIONS: Therapeutic exercises, Therapeutic activity, Neuromuscular re-education, Balance training, Gait training, Patient/Family education, Self Care, Joint mobilization, Stair training, Prosthetic training, DME instructions, Manual therapy, and Re-evaluation  PLAN FOR NEXT SESSION: Progress gait training to use of QC/ no assistive device with mod. I.  LE strengthening/ balance training with multi-planar movements.  Ozell JAYSON Sero, PT, DPT # [phone] Physical Therapist - [place]  [facility] 2:37 PM,02/25/23

## 2023-02-27 ENCOUNTER — Encounter: Payer: Medicare HMO | Admitting: Physical Therapy

## 2023-03-04 ENCOUNTER — Ambulatory Visit: Payer: Medicare Other | Admitting: Physical Therapy

## 2023-03-04 ENCOUNTER — Encounter: Payer: Self-pay | Admitting: Physical Therapy

## 2023-03-04 DIAGNOSIS — M6281 Muscle weakness (generalized): Secondary | ICD-10-CM

## 2023-03-04 DIAGNOSIS — R269 Unspecified abnormalities of gait and mobility: Secondary | ICD-10-CM

## 2023-03-04 DIAGNOSIS — R262 Difficulty in walking, not elsewhere classified: Secondary | ICD-10-CM | POA: Diagnosis not present

## 2023-03-04 DIAGNOSIS — Z89511 Acquired absence of right leg below knee: Secondary | ICD-10-CM

## 2023-03-04 DIAGNOSIS — R2681 Unsteadiness on feet: Secondary | ICD-10-CM

## 2023-03-04 NOTE — Therapy (Signed)
OUTPATIENT PHYSICAL THERAPY LOWER EXTREMITY TREATMENT  Patient Name: Melanie Juarez MRN: [id number] DOB:1945-03-06, 78 y.o., female Today's Date: 03/04/2023  END OF SESSION:  PT End of Session - 03/04/23 1304     Visit Number 41    Number of Visits 49    Date for PT Re-Evaluation 03/26/23    PT Start Time 1304    PT Stop Time 1359    PT Time Calculation (min) 55 min    Equipment Utilized During Treatment Gait belt    Activity Tolerance Patient tolerated treatment well    Behavior During Therapy WFL for tasks assessed/performed             Past Medical History:  Diagnosis Date   Diabetes (HCC)    Hypertension    Past Surgical History:  Procedure Laterality Date   AMPUTATION Right 03/22/2022   Procedure: AMPUTATION BELOW KNEE-Guillotine;  Surgeon: Annice Needy, MD;  Location: [facility] ORS;  Service: Vascular;  Laterality: Right;   LOWER EXTREMITY ANGIOGRAPHY Right 03/21/2022   Procedure: Lower Extremity Angiography;  Surgeon: Annice Needy, MD;  Location: [facility] INVASIVE CV LAB;  Service: Cardiovascular;  Laterality: Right;   STUMP REVISION Right 03/26/2022   Procedure: BKA REVISION;  Surgeon: Annice Needy, MD;  Location: [facility] ORS;  Service: General;  Laterality: Right;   Patient Active Problem List   Diagnosis Date Noted   Hx of BKA, right (HCC) 08/08/2022   Lactic acidosis 03/21/2022   Obesity (BMI 30-39.9) 03/21/2022   PAD (peripheral artery disease) (HCC) 03/21/2022   Osteomyelitis (HCC) 03/19/2022   Diabetic infection of right foot (HCC) 03/19/2022   Essential hypertension 03/19/2022   Leukocytosis 03/19/2022   AKI (acute kidney injury) (HCC) 03/19/2022   Sepsis with acute renal failure without septic shock (HCC) 03/19/2022   Hyperlipidemia 05/17/2014   Type 2 diabetes mellitus (HCC) 05/17/2014   Anemia, unspecified 05/18/2013   Benign essential hypertension 05/18/2013   REFERRING PROVIDER: Annice Needy, MD  REFERRING DIAG: s/p R BKA  THERAPY DIAG:  Difficulty in  walking, not elsewhere classified  Hx of BKA, right (HCC)  Muscle weakness (generalized)  Unsteadiness on feet  Gait difficulty  Rationale for Evaluation and Treatment: Rehabilitation  ONSET DATE: 03/22/22  SUBJECTIVE:   SUBJECTIVE STATEMENT: Pt. Had a sore on R foot resulting in R BKA on 03/22/22.  Pt. Entered Pt with use of w/c and daughter Melanie Juarez) present.  Pt. Wants to return to living independently.  Pt. Completing HEP from rehab center.  Pt. Has been unable to get into shower.    PERTINENT HISTORY: [name] is a 78 y.o. (12-09-1945) female who presents with chief complaint of     Chief Complaint  Patient presents with   Venous Insufficiency  .   History of Present Illness: Patient returns today in follow up of her right BKA.  It is healed.  She is in the process of getting her prosthesis now.  She is doing well.  No pain.  No left leg ulceration or rest pain.    Assessment/Plan   PAD (peripheral artery disease) (HCC) Check left ABI on follow up visit in 3 months.    Type 2 diabetes mellitus (HCC) blood glucose control important in reducing the progression of atherosclerotic disease. Also, involved in wound healing. On appropriate medications.    Essential hypertension blood pressure control important in reducing the progression of atherosclerotic disease. On appropriate oral medications.    Hx of BKA, right (HCC) Healed.  In the process of getting her prosthesis.  A new prescription given for physical therapy for prosthesis and ambulatory training.   [name], MD   08/08/2022 10:42 AM  PAIN:  Are you having pain? No.  Pt. Reports phantom limb symptoms but not pain.    PRECAUTIONS: Fall  RED FLAGS: None   WEIGHT BEARING RESTRICTIONS: No  FALLS:  Has patient fallen in last 6 months? No  LIVING ENVIRONMENT: Lives with: lives with their daughter  Melanie Juarez).   Lives in: House/apartment Stairs: No Has following equipment at home: Dan Humphreys - 2 wheeled,  Wheelchair (manual), and Ramped entry  OCCUPATION: Retired  PLOF: Independent.  Pt. Does not drive.    PATIENT GOALS: Ambulate with least assistive device.  Return home independently/ go back to church.    NEXT MD VISIT: [name] 7/31.  MD f/u in September?  OBJECTIVE:   PATIENT SURVEYS:  FOTO initial 29/ goal 102.    COGNITION: Overall cognitive status: Within functional limits for tasks assessed   SENSATION: WFL  EDEMA:  No R residual limb swelling noted.  PT discussed use of shrinker and proper use of ply socks for improved fit of prosthetic leg.    POSTURE: rounded shoulders and forward head  PALPATION: Minimal tenderness along R distal residual limb/ incision.  Good incision healing/ PT discussed benefits of scar massage.    LOWER EXTREMITY ROM:  Active ROM Right eval Left eval  Hip flexion [facility] WFL  Hip extension NT NT  Hip abduction [facility]  Hip adduction    Hip internal rotation    Hip external rotation    Knee flexion [facility] WFL  Knee extension -3 deg. 0 deg.  Ankle dorsiflexion    Ankle plantarflexion    Ankle inversion    Ankle eversion     (Blank rows = not tested)  LOWER EXTREMITY MMT:  MMT Right eval Left eval  Hip flexion 4/5 4/5  Hip extension    Hip abduction 3+/5 4/5  Hip adduction    Hip internal rotation    Hip external rotation    Knee flexion 4+/5  4+/5  Knee extension 4+/5 5/5  Ankle dorsiflexion    Ankle plantarflexion    Ankle inversion    Ankle eversion     (Blank rows = not tested)   B UE AROM WFL.  B UE strength grossly 4/5 MMT except bicep/tricep 4+/5 MMT  FUNCTIONAL TESTS:  5 times sit to stand: TBD Timed up and go (TUG): TBD 6 minute walk test: TBD  GAIT: Distance walked: 45 feet in gym with RW/ min. A for safety and cuing Assistive device utilized: Walker - 2 wheeled Level of assistance: Min A Comments: amb. In gym with use of RW/min A for cuing to increase step length/ heel strike.  Pt. Fearful while walking  outside of //-bars.  Pt. Ambs. In //-bars with B UE assist and increase cadence/ step pattern.  Use of mirror for posture correction/ heel strike (pt. Does not looking at a mirror).    : 305 feet with SPC - pt's prosthetic started sliding into extreme ER, time paused briefly half-way through to allow pt time to reapply the leg  FOTO: 60 (goal met)  TODAY'S TREATMENT:  DATE: 03/04/2023    Subjective:  Pt. entered PT gym with use of small base QC and R prosthetic leg donned.  Pt. Reports no pain in residual limb and states she had a nice weekend.  Pt. returning to [name] in a couple weeks for prosthetic check.  Pt. Has cataract surgery on Wednesday and will return to PT next week.     Treatment:   There.ex.:  No charge  Nustep B UE/LE level 5 (seat 10) x 10 minutes to improve cardiorespiratory endurance.  Discussed weekend activities.   Neuro mm.:  Blaze pod LE touching at star (6 pods on random setting)- RPE: 5.  1 min. X 2.  3 min. X 1.  CGA for safety and cuing.  No LOB but extra time/ focus required.   Walking in //-bars with min. to no UE assist 6 laps/ high marching.  Improved LE control without UE assist.  Use of mirror for posture correction.   Walking in gym/ hallway (154 feet) with no assistive device and focus on consistent recip. Gait pattern.  Pt. still requires occasional VC's for increased step length with LLE.   Pt. Requires CGA/SBA while walking with no assistive device for safety/ cuing.  Occasional reaching for wall in hallway to check balance.    Lateral walking/ marching in //-bars with 6" and 12" hurdles (2 laps)- CGA for safety/ cuing.    Reviewed HEP  PATIENT EDUCATION:  Education details: Discussed HEP/ standing hip ex. At [employer] Person educated: Patient and Child(ren) Education method: Software engineer Education comprehension: verbalized understanding and returned demonstration  HOME EXERCISE PROGRAM: Access Code: [email or web address] URL: [email or web address] Date: 08/30/2022 Prepared by: Maylon Peppers  Exercises - Sit to Stand with Counter Support  - 1 x daily - 5 x weekly - 3 sets - 10 reps - Standing Hip Abduction with Counter Support  - 1 x daily - 5 x weekly - 3 sets - 10 reps - Standing March with Counter Support  - 1 x daily - 5 x weekly - 3 sets - 10 reps - Standing Hip Extension with Counter Support  - 1 x daily - 5 x weekly - 3 sets - 10 reps  ASSESSMENT:  CLINICAL IMPRESSION:        Pt. Arrived to treatment session ready to participate and demonstrated less  antalgic gait. NPS upon arrival was a 0/10 and remained at a 0/10 throughout treatment. Pt. Improved with ambulation activities including side-stepping with hurdles and star activities with Blaze pods.  Pt. Ambulates with increase distance walked in hallway/gym without use of QC. Pt. will continue to benefit from skilled PT services to improve her ambulation and function with using a less restrictive assistive device (quad cane) and return her to her PLOF.   OBJECTIVE IMPAIRMENTS: Abnormal gait, decreased activity tolerance, decreased balance, decreased endurance, decreased mobility, difficulty walking, decreased ROM, decreased strength, decreased safety awareness, improper body mechanics, postural dysfunction, and pain.   ACTIVITY LIMITATIONS: carrying, lifting, standing, squatting, stairs, transfers, bed mobility, bathing, toileting, dressing, and locomotion level  PARTICIPATION LIMITATIONS: meal prep, cleaning, laundry, shopping, community activity, and church  PERSONAL FACTORS: Fitness and Past/current experiences are also affecting patient's functional outcome.   REHAB POTENTIAL: Good  CLINICAL DECISION MAKING: Evolving/moderate complexity  EVALUATION COMPLEXITY: Moderate   GOALS: Goals  reviewed with patient? Yes  LONG TERM GOALS: Target date: 03/26/23  Pt. Will increase FOTO to 46 to improve pain-free mobility.   Baseline: initial FOTO 29; 11/11:  60 Goal status: GOAL MET   2.  Pt. Will increase B LE muscle strength 1/2 muscle grade to improve pain-free mobility/ walking.   Baseline:  See above, 10/22: see above for updated scores Goal status: Partially met (see above for updated MMT)  3.  Pt. Able to ambulate 200 feet with mod. I with least assistive device to improve return to home.   Baseline: amb. Short distances with RW/ min. A from PT.  Limited endurance; 10/22: 424 feet with RW during Goal status: GOAL MET (10/22)  4. Pt will increase by at least 70m (172ft) when using LRAD (quad cane) in order to demonstrate clinically significant improvement in cardiopulmonary endurance and community ambulation  Baseline: 140 feet with quad cane; 11/11: 305 feet with quad cane  Goal status: GOAL MET (11/11)  5. Pt will be demonstrate ascending/descending at least 4 stairs with reciprocal pattern to improve independence with community mobility.  Baseline: step-to pattern with BUE assist  Goal status: Partially met  6. Pt will be able to ambulate at least 500 feet during while using the QC in order to show improvement in walking tolerance and community mobility.  Baseline: 11/11: 305 feet with QC.  12/31: 250 feet with QC and progressing to no assistive device for short distances.    Goal status: not met, progressing  7.  Pt. Able to vacuum living room with good LE control and no LOB to improve independence.    Baseline: pt. Received vacuum for Christmas but dtr. Continues to vacuum at this time.   Goal status: Not met  8.  Pt. Able to ambulate from living to kitchen/ bathroom with mod. I and no assistive safety to improve independence/ functional mobility.  Baseline: pt. Starting to amb. With CGA in clinic without QC  Goal status: Not met  PLAN:  PT  FREQUENCY: 2x/week  PT DURATION: 6 weeks  PLANNED INTERVENTIONS: Therapeutic exercises, Therapeutic activity, Neuromuscular re-education, Balance training, Gait training, Patient/Family education, Self Care, Joint mobilization, Stair training, Prosthetic training, DME instructions, Manual therapy, and Re-evaluation  PLAN FOR NEXT SESSION: Progress gait training to use of QC/ no assistive device with mod. I.  LE strengthening/ balance training with multi-planar movements.  Cammie Mcgee, PT, DPT # [phone] Physical Therapist - [place]  [facility] 8:07 PM,03/04/23

## 2023-03-06 ENCOUNTER — Encounter: Payer: Medicare HMO | Admitting: Physical Therapy

## 2023-03-11 ENCOUNTER — Encounter: Payer: Medicare HMO | Admitting: Physical Therapy

## 2023-03-13 ENCOUNTER — Ambulatory Visit: Payer: Medicare Other | Admitting: Physical Therapy

## 2023-03-18 ENCOUNTER — Encounter: Payer: Medicare HMO | Admitting: Physical Therapy

## 2023-03-20 ENCOUNTER — Ambulatory Visit: Payer: Medicare Other | Admitting: Physical Therapy

## 2023-03-25 ENCOUNTER — Ambulatory Visit: Payer: Medicare Other | Admitting: Physical Therapy

## 2023-03-27 ENCOUNTER — Ambulatory Visit: Payer: Medicare Other | Admitting: Physical Therapy

## 2023-04-03 ENCOUNTER — Ambulatory Visit: Payer: Medicare Other | Admitting: Physical Therapy

## 2023-04-09 ENCOUNTER — Ambulatory Visit: Payer: Medicare Other | Admitting: Physical Therapy

## 2023-04-11 ENCOUNTER — Ambulatory Visit: Payer: Medicare Other | Attending: Vascular Surgery | Admitting: Physical Therapy

## 2023-04-16 ENCOUNTER — Ambulatory Visit: Payer: Medicare Other | Admitting: Physical Therapy

## 2023-04-18 ENCOUNTER — Ambulatory Visit: Payer: Medicare Other | Admitting: Physical Therapy

## 2023-04-23 ENCOUNTER — Ambulatory Visit: Payer: Medicare Other | Attending: Vascular Surgery | Admitting: Physical Therapy

## 2023-04-23 ENCOUNTER — Encounter: Payer: Self-pay | Admitting: Physical Therapy

## 2023-04-23 DIAGNOSIS — R262 Difficulty in walking, not elsewhere classified: Secondary | ICD-10-CM | POA: Diagnosis present

## 2023-04-23 DIAGNOSIS — M6281 Muscle weakness (generalized): Secondary | ICD-10-CM | POA: Insufficient documentation

## 2023-04-23 DIAGNOSIS — Z89511 Acquired absence of right leg below knee: Secondary | ICD-10-CM | POA: Diagnosis present

## 2023-04-23 DIAGNOSIS — R2681 Unsteadiness on feet: Secondary | ICD-10-CM | POA: Diagnosis present

## 2023-04-23 DIAGNOSIS — R269 Unspecified abnormalities of gait and mobility: Secondary | ICD-10-CM | POA: Diagnosis present

## 2023-04-23 NOTE — Therapy (Signed)
 OUTPATIENT PHYSICAL THERAPY LOWER EXTREMITY TREATMENT/ RECERTIFICATION  Patient Name: Melanie Juarez MRN: [id number] DOB:12-08-1945, 78 y.o., female Today's Date: 04/23/2023  END OF SESSION:  PT End of Session - 04/23/23 0814     Visit Number 42    Number of Visits 54    Date for PT Re-Evaluation 06/04/23    PT Start Time 0814    PT Stop Time 0902    PT Time Calculation (min) 48 min    Equipment Utilized During Treatment Gait belt    Activity Tolerance Patient tolerated treatment well    Behavior During Therapy [facility] for tasks assessed/performed            Past Medical History:  Diagnosis Date   Diabetes (HCC)    Hypertension    Past Surgical History:  Procedure Laterality Date   AMPUTATION Right 03/22/2022   Procedure: AMPUTATION BELOW KNEE-Guillotine;  Surgeon: Annice Needy, MD;  Location: [facility] ORS;  Service: Vascular;  Laterality: Right;   LOWER EXTREMITY ANGIOGRAPHY Right 03/21/2022   Procedure: Lower Extremity Angiography;  Surgeon: Annice Needy, MD;  Location: [facility] INVASIVE CV LAB;  Service: Cardiovascular;  Laterality: Right;   STUMP REVISION Right 03/26/2022   Procedure: BKA REVISION;  Surgeon: Annice Needy, MD;  Location: [facility] ORS;  Service: General;  Laterality: Right;   Patient Active Problem List   Diagnosis Date Noted   Hx of BKA, right (HCC) 08/08/2022   Lactic acidosis 03/21/2022   Obesity (BMI 30-39.9) 03/21/2022   PAD (peripheral artery disease) (HCC) 03/21/2022   Osteomyelitis (HCC) 03/19/2022   Diabetic infection of right foot (HCC) 03/19/2022   Essential hypertension 03/19/2022   Leukocytosis 03/19/2022   AKI (acute kidney injury) (HCC) 03/19/2022   Sepsis with acute renal failure without septic shock (HCC) 03/19/2022   Hyperlipidemia 05/17/2014   Type 2 diabetes mellitus (HCC) 05/17/2014   Anemia, unspecified 05/18/2013   Benign essential hypertension 05/18/2013   REFERRING PROVIDER: Annice Needy, MD  REFERRING DIAG: s/p R BKA  THERAPY DIAG:   Difficulty in walking, not elsewhere classified  Hx of BKA, right (HCC)  Muscle weakness (generalized)  Unsteadiness on feet  Gait difficulty  Rationale for Evaluation and Treatment: Rehabilitation  ONSET DATE: 03/22/22  SUBJECTIVE:   SUBJECTIVE STATEMENT: Pt. Had a sore on R foot resulting in R BKA on 03/22/22.  [name]  Pt. Wants to return to living independently.  Pt. Completing HEP from rehab center.  Pt. Has been unable to get into shower.    PERTINENT HISTORY: Melanie Juarez is a 78 y.o. (02-18-1945) female who presents with chief complaint of     Chief Complaint  Patient presents with   Venous Insufficiency  .   History of Present Illness: Patient returns today in follow up of her right BKA.  It is healed.  She is in the process of getting her prosthesis now.  She is doing well.  No pain.  No left leg ulceration or rest pain.    Assessment/Plan   PAD (peripheral artery disease) (HCC) Check left ABI on follow up visit in 3 months.    Type 2 diabetes mellitus (HCC) blood glucose control important in reducing the progression of atherosclerotic disease. Also, involved in wound healing. On appropriate medications.    Essential hypertension blood pressure control important in reducing the progression of atherosclerotic disease. On appropriate oral medications.    Hx of BKA, right (HCC) Healed.  In the process of getting her prosthesis.  A new prescription given for physical therapy for prosthesis and ambulatory training.   [name], MD   08/08/2022 10:42 AM  PAIN:  Are you having pain? No.  Pt. Reports phantom limb symptoms but not pain.    PRECAUTIONS: Fall  RED FLAGS: None   WEIGHT BEARING RESTRICTIONS: No  FALLS:  Has patient fallen in last 6 months? No  LIVING ENVIRONMENT: Lives with: lives with their daughter  [name].   Lives in: House/apartment Stairs: No Has following equipment at home: Dan Humphreys  - 2 wheeled, Wheelchair (manual), and Ramped entry  OCCUPATION: Retired  PLOF: Independent.  Pt. Does not drive.    PATIENT GOALS: Ambulate with least assistive device.  Return home independently/ go back to church.    NEXT MD VISIT: [name] 7/31.  MD f/u in September?  OBJECTIVE:   PATIENT SURVEYS:  FOTO initial 29/ goal 13.    COGNITION: Overall cognitive status: Within functional limits for tasks assessed   SENSATION: WFL  EDEMA:  No R residual limb swelling noted.  PT discussed use of shrinker and proper use of ply socks for improved fit of prosthetic leg.    POSTURE: rounded shoulders and forward head  PALPATION: Minimal tenderness along R distal residual limb/ incision.  Good incision healing/ PT discussed benefits of scar massage.    LOWER EXTREMITY ROM:  Active ROM Right eval Left eval  Hip flexion [facility] WFL  Hip extension NT NT  Hip abduction [facility]  Hip adduction    Hip internal rotation    Hip external rotation    Knee flexion [facility] WFL  Knee extension -3 deg. 0 deg.  Ankle dorsiflexion    Ankle plantarflexion    Ankle inversion    Ankle eversion     (Blank rows = not tested)  LOWER EXTREMITY MMT:  MMT Right eval Left eval  Hip flexion 4/5 4/5  Hip extension    Hip abduction 3+/5 4/5  Hip adduction    Hip internal rotation    Hip external rotation    Knee flexion 4+/5  4+/5  Knee extension 4+/5 5/5  Ankle dorsiflexion    Ankle plantarflexion    Ankle inversion    Ankle eversion     (Blank rows = not tested)   B UE AROM WFL.  B UE strength grossly 4/5 MMT except bicep/tricep 4+/5 MMT  FUNCTIONAL TESTS:  5 times sit to stand: TBD Timed up and go (TUG): TBD 6 minute walk test: TBD  GAIT: Distance walked: 45 feet in gym with RW/ min. A for safety and cuing Assistive device utilized: Walker - 2 wheeled Level of assistance: Min A Comments: amb. In gym with use of RW/min A for cuing to increase step length/ heel strike.  Pt. Fearful  while walking outside of //-bars.  Pt. Ambs. In //-bars with B UE assist and increase cadence/ step pattern.  Use of mirror for posture correction/ heel strike (pt. Does not looking at a mirror).    : 305 feet with SPC - pt's prosthetic started sliding into extreme ER, time paused briefly half-way through to allow pt time to reapply the leg  FOTO: 60 (goal met)  TODAY'S TREATMENT:  DATE: 04/23/2023    Subjective:  Pt. entered PT gym with use of RW and R prosthetic leg donned.  Pt. Has not been seen by PT over past 2 months secondary complications after R eye cataract/ retina surgery.  Pt. Is planning to have L cataract surgery in near future.  No falls reported.  Pt. Has been walking short distances at home with use of QC.      Treatment:   There.ex.:    Nustep B UE/LE level 4 (seat 10) x 10 minutes to improve cardiorespiratory endurance.  Discussed eye surgery/ weekend activities.   B hip strength: flexion 4/5 MMT, L quad 5, R quad 4, B hamstring 4+/5.    Standing //-bars ex.: marching/ hip abduction/ hip extension 20x each.  Light UE assist and mirror feedback for proper technique.    Neuro mm.:  Walking in clinic/ hallway with QC and gait reassessment.  Consistent recip. Gait pattern.    Sit to stands with no UE assist 1x.  Pt. Challenged with no UE and benefits from 1 UE assist for safety.    Walking in gym/ hallway (180 feet) with no assistive device and focus on consistent recip. Gait pattern.  Pt. still requires occasional VC's for increased step length with LLE.   Pt. Requires CGA/SBA while walking with no assistive device for safety/ cuing.  Occasional reaching for wall in hallway to check balance.    Goal reassessment/ reviewed HEP  PATIENT EDUCATION:  Education details: Discussed HEP/ standing hip ex. At [employer] Person educated: Patient and  Child(ren) Education method: Medical illustrator Education comprehension: verbalized understanding and returned demonstration  HOME EXERCISE PROGRAM: Access Code: [email or web address] URL: [email or web address] Date: 08/30/2022 Prepared by: Maylon Peppers  Exercises - Sit to Stand with Counter Support  - 1 x daily - 5 x weekly - 3 sets - 10 reps - Standing Hip Abduction with Counter Support  - 1 x daily - 5 x weekly - 3 sets - 10 reps - Standing March with Counter Support  - 1 x daily - 5 x weekly - 3 sets - 10 reps - Standing Hip Extension with Counter Support  - 1 x daily - 5 x weekly - 3 sets - 10 reps  ASSESSMENT:  CLINICAL IMPRESSION:        Pt. Arrived to treatment session ready to participate and demonstrated less  antalgic gait. NPS upon arrival was a 0/10 and remained at a 0/10 throughout treatment. Pt. Improved with ambulation activities including side-stepping with hurdles and star activities with Blaze pods.  Pt. Ambulates with increase distance walked in hallway/gym without use of QC. Pt. will continue to benefit from skilled PT services to improve her ambulation and function with using a less restrictive assistive device (quad cane) and return her to her PLOF.   OBJECTIVE IMPAIRMENTS: Abnormal gait, decreased activity tolerance, decreased balance, decreased endurance, decreased mobility, difficulty walking, decreased ROM, decreased strength, decreased safety awareness, improper body mechanics, postural dysfunction, and pain.   ACTIVITY LIMITATIONS: carrying, lifting, standing, squatting, stairs, transfers, bed mobility, bathing, toileting, dressing, and locomotion level  PARTICIPATION LIMITATIONS: meal prep, cleaning, laundry, shopping, community activity, and church  PERSONAL FACTORS: Fitness and Past/current experiences are also affecting patient's functional outcome.   REHAB POTENTIAL: Good  CLINICAL DECISION MAKING: Evolving/moderate  complexity  EVALUATION COMPLEXITY: Moderate   GOALS: Goals reviewed with patient? Yes  LONG TERM GOALS: Target date: 06/04/23  1.  Pt. Will increase B LE muscle strength 1/2 muscle grade  to improve pain-free mobility/ walking.   Baseline:  See above, 10/22: see above for updated scores Goal status: Partially met (see above for updated MMT)  2. Pt will be demonstrate ascending/descending at least 4 stairs with reciprocal pattern to improve independence with community mobility.  Baseline: step-to pattern with BUE assist  Goal status: Partially met  3. Pt will be able to ambulate at least 500 feet during while using the QC in order to show improvement in walking tolerance and community mobility.  Baseline: 11/11: 305 feet with QC.  12/31: 250 feet with QC and progressing to no assistive device for short distances.    Goal status: not met, progressing  4.  Pt. Able to vacuum living room with good LE control and no LOB to improve independence.    Baseline: pt. Received vacuum for Christmas but dtr. Continues to vacuum at this time.   Goal status: Not met  5.  Pt. Able to ambulate from living to kitchen/ bathroom with mod. I and no assistive safety to improve independence/ functional mobility.  Baseline: pt. Starting to amb. With CGA in clinic without QC  Goal status: Not met  PLAN:  PT FREQUENCY: 2x/week  PT DURATION: 6 weeks  PLANNED INTERVENTIONS: Therapeutic exercises, Therapeutic activity, Neuromuscular re-education, Balance training, Gait training, Patient/Family education, Self Care, Joint mobilization, Stair training, Prosthetic training, DME instructions, Manual therapy, and Re-evaluation  PLAN FOR NEXT SESSION: Progress gait training to use of QC/ no assistive device with mod. I.  LE strengthening/ balance training with multi-planar movements.  COMPLETE .  Cammie Mcgee, PT, DPT # [phone] Physical Therapist - [place]  [facility] 1:26  PM,04/23/23

## 2023-04-25 ENCOUNTER — Ambulatory Visit: Payer: Medicare Other | Admitting: Physical Therapy

## 2023-04-30 ENCOUNTER — Encounter: Admitting: Physical Therapy

## 2023-05-15 ENCOUNTER — Encounter: Payer: Self-pay | Admitting: Physical Therapy

## 2023-05-15 ENCOUNTER — Ambulatory Visit: Attending: Vascular Surgery | Admitting: Physical Therapy

## 2023-05-15 DIAGNOSIS — Z89511 Acquired absence of right leg below knee: Secondary | ICD-10-CM | POA: Diagnosis present

## 2023-05-15 DIAGNOSIS — R262 Difficulty in walking, not elsewhere classified: Secondary | ICD-10-CM | POA: Insufficient documentation

## 2023-05-15 DIAGNOSIS — R269 Unspecified abnormalities of gait and mobility: Secondary | ICD-10-CM | POA: Insufficient documentation

## 2023-05-15 DIAGNOSIS — R2681 Unsteadiness on feet: Secondary | ICD-10-CM | POA: Diagnosis present

## 2023-05-15 DIAGNOSIS — M6281 Muscle weakness (generalized): Secondary | ICD-10-CM | POA: Diagnosis present

## 2023-05-15 NOTE — Therapy (Signed)
 OUTPATIENT PHYSICAL THERAPY LOWER EXTREMITY TREATMENT  Patient Name: Melanie Juarez MRN: [id number] DOB:September 17, 1945, 78 y.o., female Today's Date: 05/15/2023  END OF SESSION:  PT End of Session - 05/15/23 1252     Visit Number 43    Number of Visits 54    Date for PT Re-Evaluation 06/04/23    PT Start Time 1253    PT Stop Time 1332    PT Time Calculation (min) 39 min    Equipment Utilized During Treatment Gait belt    Activity Tolerance Patient tolerated treatment well    Behavior During Therapy WFL for tasks assessed/performed            Past Medical History:  Diagnosis Date   Diabetes (HCC)    Hypertension    Past Surgical History:  Procedure Laterality Date   AMPUTATION Right 03/22/2022   Procedure: AMPUTATION BELOW KNEE-Guillotine;  Surgeon: Annice Needy, MD;  Location: [facility] ORS;  Service: Vascular;  Laterality: Right;   LOWER EXTREMITY ANGIOGRAPHY Right 03/21/2022   Procedure: Lower Extremity Angiography;  Surgeon: Annice Needy, MD;  Location: [facility] INVASIVE CV [facility];  Service: Cardiovascular;  Laterality: Right;   STUMP REVISION Right 03/26/2022   Procedure: BKA REVISION;  Surgeon: Annice Needy, MD;  Location: [facility] ORS;  Service: General;  Laterality: Right;   Patient Active Problem List   Diagnosis Date Noted   Hx of BKA, right (HCC) 08/08/2022   Lactic acidosis 03/21/2022   Obesity (BMI 30-39.9) 03/21/2022   PAD (peripheral artery disease) (HCC) 03/21/2022   Osteomyelitis (HCC) 03/19/2022   Diabetic infection of right foot (HCC) 03/19/2022   Essential hypertension 03/19/2022   Leukocytosis 03/19/2022   AKI (acute kidney injury) (HCC) 03/19/2022   Sepsis with acute renal failure without septic shock (HCC) 03/19/2022   Hyperlipidemia 05/17/2014   Type 2 diabetes mellitus (HCC) 05/17/2014   Anemia, unspecified 05/18/2013   Benign essential hypertension 05/18/2013   REFERRING PROVIDER: Annice Needy, MD  REFERRING DIAG: s/p R BKA  THERAPY DIAG:  Difficulty in  walking, not elsewhere classified  Hx of BKA, right (HCC)  Muscle weakness (generalized)  Unsteadiness on feet  Gait difficulty  Rationale for Evaluation and Treatment: Rehabilitation  ONSET DATE: 03/22/22  SUBJECTIVE:   SUBJECTIVE STATEMENT: Pt. Had a sore on R foot resulting in R BKA on 03/22/22.  [name]  Pt. Wants to return to living independently.  Pt. Completing HEP from rehab center.  Pt. Has been unable to get into shower.    PERTINENT HISTORY: KINDEL Juarez is a 78 y.o. (03-Feb-1946) female who presents with chief complaint of     Chief Complaint  Patient presents with   Venous Insufficiency  .   History of Present Illness: Patient returns today in follow up of her right BKA.  It is healed.  She is in the process of getting her prosthesis now.  She is doing well.  No pain.  No left leg ulceration or rest pain.    Assessment/Plan   PAD (peripheral artery disease) (HCC) Check left ABI on follow up visit in 3 months.    Type 2 diabetes mellitus (HCC) blood glucose control important in reducing the progression of atherosclerotic disease. Also, involved in wound healing. On appropriate medications.    Essential hypertension blood pressure control important in reducing the progression of atherosclerotic disease. On appropriate oral medications.    Hx of BKA, right (HCC)  Healed.  In the process of getting her prosthesis.  A new prescription given for physical therapy for prosthesis and ambulatory training.   [name], MD   08/08/2022 10:42 AM  PAIN:  Are you having pain? No.  Pt. Reports phantom limb symptoms but not pain.    PRECAUTIONS: Fall  RED FLAGS: None   WEIGHT BEARING RESTRICTIONS: No  FALLS:  Has patient fallen in last 6 months? No  LIVING ENVIRONMENT: Lives with: lives with their daughter  [name].   Lives in: House/apartment Stairs: No Has following equipment at home: Dan Humphreys - 2 wheeled,  Wheelchair (manual), and Ramped entry  OCCUPATION: Retired  PLOF: Independent.  Pt. Does not drive.    PATIENT GOALS: Ambulate with least assistive device.  Return home independently/ go back to church.    NEXT MD VISIT: [name] 7/31.  MD f/u in September?  OBJECTIVE:   PATIENT SURVEYS:  FOTO initial 29/ goal 33.    COGNITION: Overall cognitive status: Within functional limits for tasks assessed   SENSATION: WFL  EDEMA:  No R residual limb swelling noted.  PT discussed use of shrinker and proper use of ply socks for improved fit of prosthetic leg.    POSTURE: rounded shoulders and forward head  PALPATION: Minimal tenderness along R distal residual limb/ incision.  Good incision healing/ PT discussed benefits of scar massage.    LOWER EXTREMITY ROM:  Active ROM Right eval Left eval  Hip flexion [facility] WFL  Hip extension NT NT  Hip abduction [facility]  Hip adduction    Hip internal rotation    Hip external rotation    Knee flexion [facility] WFL  Knee extension -3 deg. 0 deg.  Ankle dorsiflexion    Ankle plantarflexion    Ankle inversion    Ankle eversion     (Blank rows = not tested)  LOWER EXTREMITY MMT:  MMT Right eval Left eval  Hip flexion 4/5 4/5  Hip extension    Hip abduction 3+/5 4/5  Hip adduction    Hip internal rotation    Hip external rotation    Knee flexion 4+/5  4+/5  Knee extension 4+/5 5/5  Ankle dorsiflexion    Ankle plantarflexion    Ankle inversion    Ankle eversion     (Blank rows = not tested)   B UE AROM WFL.  B UE strength grossly 4/5 MMT except bicep/tricep 4+/5 MMT  FUNCTIONAL TESTS:  5 times sit to stand: TBD Timed up and go (TUG): TBD 6 minute walk test: TBD  GAIT: Distance walked: 45 feet in gym with RW/ min. A for safety and cuing Assistive device utilized: Walker - 2 wheeled Level of assistance: Min A Comments: amb. In gym with use of RW/min A for cuing to increase step length/ heel strike.  Pt. Fearful while walking  outside of //-bars.  Pt. Ambs. In //-bars with B UE assist and increase cadence/ step pattern.  Use of mirror for posture correction/ heel strike (pt. Does not looking at a mirror).    : 305 feet with SPC - pt's prosthetic started sliding into extreme ER, time paused briefly half-way through to allow pt time to reapply the leg  FOTO: 60 (goal met)  B hip strength: flexion 4/5 MMT, L quad 5, R quad 4, B hamstring 4+/5.    TODAY'S TREATMENT:  DATE: 05/15/2023    Subjective:  Pt. entered PT gym with use of QC and R prosthetic leg donned.  Pt. Has not been seen by PT over past couple weeks secondary to schedule/ insurance authorization.  No falls reported.  Pt. Is planning to have L cataract surgery in near future.  No falls reported.  Pt. Has been walking short distances at home with use of QC.      Treatment:   There.ex.:    Standing //-bars ex.: marching/ hip abduction/ hip extension 20x each.  Light UE assist and mirror feedback for proper technique.    Discussed HEP.  Neuro mm.:  Walking in clinic/ gym with QC and gait reassessment.  Consistent recip. Gait pattern.    Sit to stands with 1 UE assist for safety.  Pt. Challenged with no UE and benefits from 1 UE assist for safety.    Ascending/ descending 4 stairs x 3 with step to gait and handrail support.    Walking in gym/ hallway (105 feet) with no assistive device and focus on consistent recip. Gait pattern.  Pt. still requires occasional VC's for increased step length with LLE.   Pt. Requires CGA/SBA while walking with no assistive device for safety/ cuing.  Occasional reaching for wall in hallway to check balance.      NOT TODAY: Nustep B UE/LE level 4 (seat 10) x 10 minutes to improve cardiorespiratory endurance.  Discussed eye surgery/ weekend activities.   PATIENT EDUCATION:  Education details:  Discussed HEP/ standing hip ex. At [employer] Person educated: Patient and Child(ren) Education method: Medical illustrator Education comprehension: verbalized understanding and returned demonstration  HOME EXERCISE PROGRAM: Access Code: [email or web address] URL: [email or web address] Date: 08/30/2022 Prepared by: Maylon Peppers  Exercises - Sit to Stand with Counter Support  - 1 x daily - 5 x weekly - 3 sets - 10 reps - Standing Hip Abduction with Counter Support  - 1 x daily - 5 x weekly - 3 sets - 10 reps - Standing March with Counter Support  - 1 x daily - 5 x weekly - 3 sets - 10 reps - Standing Hip Extension with Counter Support  - 1 x daily - 5 x weekly - 3 sets - 10 reps  ASSESSMENT:  CLINICAL IMPRESSION:        Pt. Arrived to treatment session ready to participate and demonstrated less antalgic gait. NPS upon arrival was a 0/10 and remained at a 0/10 throughout treatment. Pt. Improved with ambulation without use of QC and more consistent recip. Gait pattern. Pt. Ambulates with increase distance walked in hallway/gym without use of QC. Pt. will continue to benefit from skilled PT services to improve her ambulation and function with using a less restrictive assistive device (quad cane) and return her to her PLOF.   OBJECTIVE IMPAIRMENTS: Abnormal gait, decreased activity tolerance, decreased balance, decreased endurance, decreased mobility, difficulty walking, decreased ROM, decreased strength, decreased safety awareness, improper body mechanics, postural dysfunction, and pain.   ACTIVITY LIMITATIONS: carrying, lifting, standing, squatting, stairs, transfers, bed mobility, bathing, toileting, dressing, and locomotion level  PARTICIPATION LIMITATIONS: meal prep, cleaning, laundry, shopping, community activity, and church  PERSONAL FACTORS: Fitness and Past/current experiences are also affecting patient's functional outcome.   REHAB POTENTIAL: Good  CLINICAL  DECISION MAKING: Evolving/moderate complexity  EVALUATION COMPLEXITY: Moderate   GOALS: Goals reviewed with patient? Yes  LONG TERM GOALS: Target date: 06/04/23  1.  Pt. Will increase B LE muscle strength 1/2 muscle grade to  improve pain-free mobility/ walking.   Baseline:  See above, 10/22: see above for updated scores Goal status: Partially met (see above for updated MMT)  2. Pt will be demonstrate ascending/descending at least 4 stairs with reciprocal pattern to improve independence with community mobility.  Baseline: step-to pattern with BUE assist  Goal status: Partially met  3. Pt will be able to ambulate at least 500 feet during while using the QC in order to show improvement in walking tolerance and community mobility.  Baseline: 11/11: 305 feet with QC.  12/31: 250 feet with QC and progressing to no assistive device for short distances.    Goal status: not met, progressing  4.  Pt. Able to vacuum living room with good LE control and no LOB to improve independence.    Baseline: pt. Received vacuum for Christmas but dtr. Continues to vacuum at this time.   Goal status: Not met  5.  Pt. Able to ambulate from living to kitchen/ bathroom with mod. I and no assistive safety to improve independence/ functional mobility.  Baseline: pt. Starting to amb. With CGA in clinic without QC  Goal status: Not met  PLAN:  PT FREQUENCY: 2x/week  PT DURATION: 6 weeks  PLANNED INTERVENTIONS: Therapeutic exercises, Therapeutic activity, Neuromuscular re-education, Balance training, Gait training, Patient/Family education, Self Care, Joint mobilization, Stair training, Prosthetic training, DME instructions, Manual therapy, and Re-evaluation  PLAN FOR NEXT SESSION: Progress gait training to use of QC/ no assistive device with mod. I.  LE strengthening/ balance training with multi-planar movements.  COMPLETE .  Cammie Mcgee, PT, DPT # [phone] Physical Therapist - [place]   [facility] 2:09 PM,05/15/23

## 2023-05-20 ENCOUNTER — Ambulatory Visit: Admitting: Physical Therapy

## 2023-05-22 ENCOUNTER — Ambulatory Visit: Admitting: Physical Therapy

## 2023-05-27 ENCOUNTER — Ambulatory Visit: Admitting: Physical Therapy

## 2023-05-27 ENCOUNTER — Encounter: Payer: Self-pay | Admitting: Physical Therapy

## 2023-05-27 DIAGNOSIS — R2681 Unsteadiness on feet: Secondary | ICD-10-CM

## 2023-05-27 DIAGNOSIS — R269 Unspecified abnormalities of gait and mobility: Secondary | ICD-10-CM

## 2023-05-27 DIAGNOSIS — M6281 Muscle weakness (generalized): Secondary | ICD-10-CM

## 2023-05-27 DIAGNOSIS — R262 Difficulty in walking, not elsewhere classified: Secondary | ICD-10-CM

## 2023-05-27 DIAGNOSIS — Z89511 Acquired absence of right leg below knee: Secondary | ICD-10-CM

## 2023-05-27 NOTE — Therapy (Signed)
 OUTPATIENT PHYSICAL THERAPY LOWER EXTREMITY TREATMENT  Patient Name: Melanie Juarez MRN: [id number] DOB:1946-02-07, 78 y.o., female Today's Date: 05/27/2023  END OF SESSION:  PT End of Session - 05/27/23 1302     Visit Number 44    Number of Visits 54    Date for PT Re-Evaluation 06/04/23    PT Start Time 1302    PT Stop Time 1347    PT Time Calculation (min) 45 min    Equipment Utilized During Treatment Gait belt    Activity Tolerance Patient tolerated treatment well    Behavior During Therapy WFL for tasks assessed/performed            Past Medical History:  Diagnosis Date   Diabetes (HCC)    Hypertension    Past Surgical History:  Procedure Laterality Date   AMPUTATION Right 03/22/2022   Procedure: AMPUTATION BELOW KNEE-Guillotine;  Surgeon: Annice Needy, MD;  Location: [facility] ORS;  Service: Vascular;  Laterality: Right;   LOWER EXTREMITY ANGIOGRAPHY Right 03/21/2022   Procedure: Lower Extremity Angiography;  Surgeon: Annice Needy, MD;  Location: [facility] INVASIVE CV LAB;  Service: Cardiovascular;  Laterality: Right;   STUMP REVISION Right 03/26/2022   Procedure: BKA REVISION;  Surgeon: Annice Needy, MD;  Location: [facility] ORS;  Service: General;  Laterality: Right;   Patient Active Problem List   Diagnosis Date Noted   Hx of BKA, right (HCC) 08/08/2022   Lactic acidosis 03/21/2022   Obesity (BMI 30-39.9) 03/21/2022   PAD (peripheral artery disease) (HCC) 03/21/2022   Osteomyelitis (HCC) 03/19/2022   Diabetic infection of right foot (HCC) 03/19/2022   Essential hypertension 03/19/2022   Leukocytosis 03/19/2022   AKI (acute kidney injury) (HCC) 03/19/2022   Sepsis with acute renal failure without septic shock (HCC) 03/19/2022   Hyperlipidemia 05/17/2014   Type 2 diabetes mellitus (HCC) 05/17/2014   Anemia, unspecified 05/18/2013   Benign essential hypertension 05/18/2013   REFERRING PROVIDER: Annice Needy, MD  REFERRING DIAG: s/p R BKA  THERAPY DIAG:  Difficulty in  walking, not elsewhere classified  Hx of BKA, right (HCC)  Muscle weakness (generalized)  Unsteadiness on feet  Gait difficulty  Rationale for Evaluation and Treatment: Rehabilitation  ONSET DATE: 03/22/22  SUBJECTIVE:   SUBJECTIVE STATEMENT: Pt. Had a sore on R foot resulting in R BKA on 03/22/22.  Pt. Entered Pt with use of w/c and daughter Melanie Juarez) present.  Pt. Wants to return to living independently.  Pt. Completing HEP from rehab center.  Pt. Has been unable to get into shower.    PERTINENT HISTORY: Melanie Juarez is a 78 y.o. (02/01/1946) female who presents with chief complaint of     Chief Complaint  Patient presents with   Venous Insufficiency  .   History of Present Illness: Patient returns today in follow up of her right BKA.  It is healed.  She is in the process of getting her prosthesis now.  She is doing well.  No pain.  No left leg ulceration or rest pain.    Assessment/Plan   PAD (peripheral artery disease) (HCC) Check left ABI on follow up visit in 3 months.    Type 2 diabetes mellitus (HCC) blood glucose control important in reducing the progression of atherosclerotic disease. Also, involved in wound healing. On appropriate medications.    Essential hypertension blood pressure control important in reducing the progression of atherosclerotic disease. On appropriate oral medications.    Hx of BKA, right (HCC)  Healed.  In the process of getting her prosthesis.  A new prescription given for physical therapy for prosthesis and ambulatory training.   [name], MD   08/08/2022 10:42 AM  PAIN:  Are you having pain? No.  Pt. Reports phantom limb symptoms but not pain.    PRECAUTIONS: Fall  RED FLAGS: None   WEIGHT BEARING RESTRICTIONS: No  FALLS:  Has patient fallen in last 6 months? No  LIVING ENVIRONMENT: Lives with: lives with their daughter  Melanie Juarez).   Lives in: House/apartment Stairs: No Has following equipment at home: Dan Humphreys - 2 wheeled,  Wheelchair (manual), and Ramped entry  OCCUPATION: Retired  PLOF: Independent.  Pt. Does not drive.    PATIENT GOALS: Ambulate with least assistive device.  Return home independently/ go back to church.    NEXT MD VISIT: Melanie Juarez 7/31.  MD f/u in September?  OBJECTIVE:   PATIENT SURVEYS:  FOTO initial 29/ goal 31.    COGNITION: Overall cognitive status: Within functional limits for tasks assessed   SENSATION: WFL  EDEMA:  No R residual limb swelling noted.  PT discussed use of shrinker and proper use of ply socks for improved fit of prosthetic leg.    POSTURE: rounded shoulders and forward head  PALPATION: Minimal tenderness along R distal residual limb/ incision.  Good incision healing/ PT discussed benefits of scar massage.    LOWER EXTREMITY ROM:  Active ROM Right eval Left eval  Hip flexion [facility] WFL  Hip extension NT NT  Hip abduction [facility]  Hip adduction    Hip internal rotation    Hip external rotation    Knee flexion [facility] WFL  Knee extension -3 deg. 0 deg.  Ankle dorsiflexion    Ankle plantarflexion    Ankle inversion    Ankle eversion     (Blank rows = not tested)  LOWER EXTREMITY MMT:  MMT Right eval Left eval  Hip flexion 4/5 4/5  Hip extension    Hip abduction 3+/5 4/5  Hip adduction    Hip internal rotation    Hip external rotation    Knee flexion 4+/5  4+/5  Knee extension 4+/5 5/5  Ankle dorsiflexion    Ankle plantarflexion    Ankle inversion    Ankle eversion     (Blank rows = not tested)   B UE AROM WFL.  B UE strength grossly 4/5 MMT except bicep/tricep 4+/5 MMT  FUNCTIONAL TESTS:  5 times sit to stand: TBD Timed up and go (TUG): TBD 6 minute walk test: TBD  GAIT: Distance walked: 45 feet in gym with RW/ min. A for safety and cuing Assistive device utilized: Walker - 2 wheeled Level of assistance: Min A Comments: amb. In gym with use of RW/min A for cuing to increase step length/ heel strike.  Pt. Fearful while walking  outside of //-bars.  Pt. Ambs. In //-bars with B UE assist and increase cadence/ step pattern.  Use of mirror for posture correction/ heel strike (pt. Does not looking at a mirror).    : 305 feet with SPC - pt's prosthetic started sliding into extreme ER, time paused briefly half-way through to allow pt time to reapply the leg  FOTO: 60 (goal met)  B hip strength: flexion 4/5 MMT, L quad 5, R quad 4, B hamstring 4+/5.    TODAY'S TREATMENT:  DATE: 05/27/2023    Subjective:  Pt. entered PT gym with use of QC and R prosthetic leg donned.  No falls reported.  Pt. Is planning to have L cataract surgery in near future.  Pt. States R prosthetic leg has been "rubbing" knee during bending.  No swelling reported in R lower leg and pt. Has been wearing shrinker at night.  Pt. May contact Wava Hagedorn to discuss fit on prosthetic leg/ ply socks.    Treatment:   There.ex.:    Nustep L4 10 min. B UE/LE.  Discussed weekend activities/ fit of prosthetic leg.    Standing //-bars ex.: marching/ hip abduction/ hip extension 20x each.  Light UE assist and mirror feedback for proper technique.    Discussed HEP.  Neuro mm.:  Walking in clinic/ gym with QC and gait reassessment.  Consistent recip. Gait pattern.    Walking in //-bars: 6" and 12" step overs (recip. Pattern)- 1 UE assist on //-bars for safety.    Tandem stance and gait in //-bars with light UE assist for safety.    STS from gray chair with walking around cones without UE assist.  SBA/CGA for safety and verbal cuing. Sit to stands with 1 UE assist for safety.  Pt. Challenged with no UE and benefits from 1 UE assist for safety.    Walking in gym/ hallway (130 feet) with no assistive device and focus on consistent recip. Gait pattern.  Pt. still requires occasional VC's for increased step length with LLE.   Pt. Requires  CGA/SBA while walking with no assistive device for safety/ cuing.  Occasional reaching for wall in hallway to check balance.     PATIENT EDUCATION:  Education details: Discussed HEP/ standing hip ex. At [employer] Person educated: Patient and Child(ren) Education method: Medical illustrator Education comprehension: verbalized understanding and returned demonstration  HOME EXERCISE PROGRAM: Access Code: [email or web address] URL: [email or web address] Date: 08/30/2022 Prepared by: Janine Melbourne  Exercises - Sit to Stand with Counter Support  - 1 x daily - 5 x weekly - 3 sets - 10 reps - Standing Hip Abduction with Counter Support  - 1 x daily - 5 x weekly - 3 sets - 10 reps - Standing March with Counter Support  - 1 x daily - 5 x weekly - 3 sets - 10 reps - Standing Hip Extension with Counter Support  - 1 x daily - 5 x weekly - 3 sets - 10 reps  ASSESSMENT:  CLINICAL IMPRESSION:        Pt. Arrived to treatment session ready to participate and demonstrated less antalgic gait. NPS upon arrival was a 0/10 and remained at a 0/10 throughout treatment. Pt. Improved with ambulation without use of QC and more consistent recip. Gait pattern. Pt. Ambulates with increase distance walked in hallway/gym without use of QC. Pt. will continue to benefit from skilled PT services to improve her ambulation and function with using a less restrictive assistive device (quad cane) and return her to her PLOF.   OBJECTIVE IMPAIRMENTS: Abnormal gait, decreased activity tolerance, decreased balance, decreased endurance, decreased mobility, difficulty walking, decreased ROM, decreased strength, decreased safety awareness, improper body mechanics, postural dysfunction, and pain.   ACTIVITY LIMITATIONS: carrying, lifting, standing, squatting, stairs, transfers, bed mobility, bathing, toileting, dressing, and locomotion level  PARTICIPATION LIMITATIONS: meal prep, cleaning, laundry, shopping,  community activity, and church  PERSONAL FACTORS: Fitness and Past/current experiences are also affecting patient's functional outcome.   REHAB POTENTIAL: Good  CLINICAL DECISION MAKING:  Evolving/moderate complexity  EVALUATION COMPLEXITY: Moderate   GOALS: Goals reviewed with patient? Yes  LONG TERM GOALS: Target date: 06/04/23  1.  Pt. Will increase B LE muscle strength 1/2 muscle grade to improve pain-free mobility/ walking.   Baseline:  See above, 10/22: see above for updated scores Goal status: Partially met (see above for updated MMT)  2. Pt will be demonstrate ascending/descending at least 4 stairs with reciprocal pattern to improve independence with community mobility.  Baseline: step-to pattern with BUE assist  Goal status: Partially met  3. Pt will be able to ambulate at least 500 feet during while using the QC in order to show improvement in walking tolerance and community mobility.  Baseline: 11/11: 305 feet with QC.  12/31: 250 feet with QC and progressing to no assistive device for short distances.    Goal status: not met, progressing  4.  Pt. Able to vacuum living room with good LE control and no LOB to improve independence.    Baseline: pt. Received vacuum for Christmas but dtr. Continues to vacuum at this time.   Goal status: Not met  5.  Pt. Able to ambulate from living to kitchen/ bathroom with mod. I and no assistive safety to improve independence/ functional mobility.  Baseline: pt. Starting to amb. With CGA in clinic without QC  Goal status: Not met  PLAN:  PT FREQUENCY: 2x/week  PT DURATION: 6 weeks  PLANNED INTERVENTIONS: Therapeutic exercises, Therapeutic activity, Neuromuscular re-education, Balance training, Gait training, Patient/Family education, Self Care, Joint mobilization, Stair training, Prosthetic training, DME instructions, Manual therapy, and Re-evaluation  PLAN FOR NEXT SESSION: Progress gait training to use of QC/ no assistive  device with mod. I.  COMPLETE .  Pt. Has 1 more PT tx. Session authorized.    Lendell Quarry, PT, DPT # [phone] Physical Therapist - [place]  [facility] 2:58 PM,05/27/23

## 2023-05-29 ENCOUNTER — Encounter: Admitting: Physical Therapy

## 2023-06-03 ENCOUNTER — Encounter: Admitting: Physical Therapy

## 2023-06-05 ENCOUNTER — Encounter: Admitting: Physical Therapy

## 2023-06-10 ENCOUNTER — Encounter: Admitting: Physical Therapy

## 2023-06-12 ENCOUNTER — Ambulatory Visit: Admitting: Physical Therapy

## 2023-06-12 DIAGNOSIS — Z89511 Acquired absence of right leg below knee: Secondary | ICD-10-CM

## 2023-06-12 DIAGNOSIS — R269 Unspecified abnormalities of gait and mobility: Secondary | ICD-10-CM

## 2023-06-12 DIAGNOSIS — R2681 Unsteadiness on feet: Secondary | ICD-10-CM

## 2023-06-12 DIAGNOSIS — M6281 Muscle weakness (generalized): Secondary | ICD-10-CM

## 2023-06-12 DIAGNOSIS — R262 Difficulty in walking, not elsewhere classified: Secondary | ICD-10-CM | POA: Diagnosis not present

## 2023-06-12 NOTE — Therapy (Signed)
 OUTPATIENT PHYSICAL THERAPY LOWER EXTREMITY TREATMENT/ RECERTIFICATION  Patient Name: Melanie Juarez MRN: [id number] DOB:January 23, 1946, 78 y.o., female Today's Date: 06/12/2023  END OF SESSION:  PT End of Session - 06/12/23 1253     Visit Number 45    Number of Visits 57    Date for PT Re-Evaluation 07/24/23    PT Start Time 1253    PT Stop Time 1331    PT Time Calculation (min) 38 min    Equipment Utilized During Treatment Gait belt    Activity Tolerance Patient tolerated treatment well    Behavior During Therapy WFL for tasks assessed/performed            Past Medical History:  Diagnosis Date   Diabetes (HCC)    Hypertension    Past Surgical History:  Procedure Laterality Date   AMPUTATION Right 03/22/2022   Procedure: AMPUTATION BELOW KNEE-Guillotine;  Surgeon: Celso College, MD;  Location: [facility] ORS;  Service: Vascular;  Laterality: Right;   LOWER EXTREMITY ANGIOGRAPHY Right 03/21/2022   Procedure: Lower Extremity Angiography;  Surgeon: Celso College, MD;  Location: [facility] INVASIVE CV [facility];  Service: Cardiovascular;  Laterality: Right;   STUMP REVISION Right 03/26/2022   Procedure: BKA REVISION;  Surgeon: Celso College, MD;  Location: [facility] ORS;  Service: General;  Laterality: Right;   Patient Active Problem List   Diagnosis Date Noted   Hx of BKA, right (HCC) 08/08/2022   Lactic acidosis 03/21/2022   Obesity (BMI 30-39.9) 03/21/2022   PAD (peripheral artery disease) (HCC) 03/21/2022   Osteomyelitis (HCC) 03/19/2022   Diabetic infection of right foot (HCC) 03/19/2022   Essential hypertension 03/19/2022   Leukocytosis 03/19/2022   AKI (acute kidney injury) (HCC) 03/19/2022   Sepsis with acute renal failure without septic shock (HCC) 03/19/2022   Hyperlipidemia 05/17/2014   Type 2 diabetes mellitus (HCC) 05/17/2014   Anemia, unspecified 05/18/2013   Benign essential hypertension 05/18/2013   REFERRING PROVIDER: Celso College, MD  REFERRING DIAG: s/p R BKA  THERAPY DIAG:   Difficulty in walking, not elsewhere classified  Hx of BKA, right (HCC)  Muscle weakness (generalized)  Unsteadiness on feet  Gait difficulty  Rationale for Evaluation and Treatment: Rehabilitation  ONSET DATE: 03/22/22  SUBJECTIVE:   SUBJECTIVE STATEMENT: Pt. Had a sore on R foot resulting in R BKA on 03/22/22.  Pt. Entered Pt with use of w/c and daughter Ira Mann) present.  Pt. Wants to return to living independently.  Pt. Completing HEP from rehab center.  Pt. Has been unable to get into shower.    PERTINENT HISTORY: KAI KENNON is a 78 y.o. (1945/12/15) female who presents with chief complaint of     Chief Complaint  Patient presents with   Venous Insufficiency  .   History of Present Illness: Patient returns today in follow up of her right BKA.  It is healed.  She is in the process of getting her prosthesis now.  She is doing well.  No pain.  No left leg ulceration or rest pain.    Assessment/Plan   PAD (peripheral artery disease) (HCC) Check left ABI on follow up visit in 3 months.    Type 2 diabetes mellitus (HCC) blood glucose control important in reducing the progression of atherosclerotic disease. Also, involved in wound healing. On appropriate medications.    Essential hypertension blood pressure control important in reducing the progression of atherosclerotic disease. On appropriate oral medications.    Hx of BKA, right (  HCC) Healed.  In the process of getting her prosthesis.  A new prescription given for physical therapy for prosthesis and ambulatory training.   Mikki Alexander, MD   08/08/2022 10:42 AM  PAIN:  Are you having pain? No.  Pt. Reports phantom limb symptoms but not pain.    PRECAUTIONS: Fall  RED FLAGS: None   WEIGHT BEARING RESTRICTIONS: No  FALLS:  Has patient fallen in last 6 months? No  LIVING ENVIRONMENT: Lives with: lives with their daughter  Ira Mann).   Lives in: House/apartment Stairs: No Has following equipment at home: Otho Blitz  - 2 wheeled, Wheelchair (manual), and Ramped entry  OCCUPATION: Retired  PLOF: Independent.  Pt. Does not drive.    PATIENT GOALS: Ambulate with least assistive device.  Return home independently/ go back to church.    NEXT MD VISIT: [facility] 7/31.  MD f/u in September?  OBJECTIVE:   PATIENT SURVEYS:  FOTO initial 29/ goal 21.    COGNITION: Overall cognitive status: Within functional limits for tasks assessed   SENSATION: WFL  EDEMA:  No R residual limb swelling noted.  PT discussed use of shrinker and proper use of ply socks for improved fit of prosthetic leg.    POSTURE: rounded shoulders and forward head  PALPATION: Minimal tenderness along R distal residual limb/ incision.  Good incision healing/ PT discussed benefits of scar massage.    LOWER EXTREMITY ROM:  Active ROM Right eval Left eval  Hip flexion [facility] WFL  Hip extension NT NT  Hip abduction [facility]  Hip adduction    Hip internal rotation    Hip external rotation    Knee flexion [facility] WFL  Knee extension -3 deg. 0 deg.  Ankle dorsiflexion    Ankle plantarflexion    Ankle inversion    Ankle eversion     (Blank rows = not tested)  LOWER EXTREMITY MMT:  MMT Right eval Left eval  Hip flexion 4/5 4/5  Hip extension    Hip abduction 3+/5 4/5  Hip adduction    Hip internal rotation    Hip external rotation    Knee flexion 4+/5  4+/5  Knee extension 4+/5 5/5  Ankle dorsiflexion    Ankle plantarflexion    Ankle inversion    Ankle eversion     (Blank rows = not tested)   B UE AROM WFL.  B UE strength grossly 4/5 MMT except bicep/tricep 4+/5 MMT  FUNCTIONAL TESTS:  5 times sit to stand: TBD Timed up and go (TUG): TBD 6 minute walk test: TBD  GAIT: Distance walked: 45 feet in gym with RW/ min. A for safety and cuing Assistive device utilized: Walker - 2 wheeled Level of assistance: Min A Comments: amb. In gym with use of RW/min A for cuing to increase step length/ heel strike.  Pt. Fearful  while walking outside of //-bars.  Pt. Ambs. In //-bars with B UE assist and increase cadence/ step pattern.  Use of mirror for posture correction/ heel strike (pt. Does not looking at a mirror).    : 305 feet with SPC - pt's prosthetic started sliding into extreme ER, time paused briefly half-way through to allow pt time to reapply the leg  FOTO: 60 (goal met)  B hip strength: flexion 4/5 MMT, L quad 5, R quad 4, B hamstring 4+/5.    TODAY'S TREATMENT:  DATE: 06/12/2023    Subjective:  Pt. entered PT gym with use of QC and R prosthetic leg donned.  No falls reported.  Pt. Is planning to have L cataract surgery on May 7th.  Pt. Returns to [facility] to discuss fit on prosthetic leg/ ply socks on 06/26/23.     Treatment:   There.ex.:    Standing //-bars ex.: marching/ hip abduction/ hip extension/ hamstring curls 20x each.  Light UE assist and mirror feedback in //-bars for proper technique.    Discussed HEP.  Neuro mm.:  Walking in clinic/ gym with QC and gait reassessment.  Consistent recip. Gait pattern.    Walking in //-bars: 6"  step overs (recip. Pattern)- 1 UE assist on //-bars for safety.  Focus on prevent R hip circumduction.     STS from gray chair in stairs with SBA/CGA for safety and verbal cuing. Sit to stands with 1 UE assist for safety.  Pt. Challenged with no UE and benefits from 1 UE assist for safety.    Ascending/descending stairs with recip. Gait pattern with B UE assist and progressing to L UE assist only on handrails.  Pt. More cautious with 1 UE assist but no issues/LOB.    Walking in gym/ hallway (185 feet) with no assistive device and focus on consistent recip. Gait pattern.  Pt. still requires occasional VC's for increased step length with LLE.   Pt. Requires CGA/SBA while walking with no assistive device for safety/ cuing.  Occasional  reaching for wall in hallway to check balance.  Pt. Walked outside to car with no assistive device and CGA for safety.  Good clearance over door threshold/ carpet.      NOT TODAY: Nustep L4 10 min. B UE/LE.  Discussed weekend activities/ fit of prosthetic leg.     PATIENT EDUCATION:  Education details: Discussed HEP/ standing hip ex. At [employer] Person educated: Patient and Child(ren) Education method: Medical illustrator Education comprehension: verbalized understanding and returned demonstration  HOME EXERCISE PROGRAM: Access Code: [email or web address] URL: [email or web address] Date: 08/30/2022 Prepared by: Janine Melbourne  Exercises - Sit to Stand with Counter Support  - 1 x daily - 5 x weekly - 3 sets - 10 reps - Standing Hip Abduction with Counter Support  - 1 x daily - 5 x weekly - 3 sets - 10 reps - Standing March with Counter Support  - 1 x daily - 5 x weekly - 3 sets - 10 reps - Standing Hip Extension with Counter Support  - 1 x daily - 5 x weekly - 3 sets - 10 reps  ASSESSMENT:  CLINICAL IMPRESSION:        Pt. Arrived to treatment session ready to participate and demonstrated less antalgic gait. NPS upon arrival was a 0/10 and remained at a 0/10 throughout treatment. Pt. Improved with ambulation without use of QC and more consistent recip. Gait pattern. Pt. Ambulates with increase distance walked in hallway/gym without use of QC. Pt. will continue to benefit from skilled PT services to improve her ambulation and function with using a less restrictive assistive device (quad cane) and return her to her PLOF.   OBJECTIVE IMPAIRMENTS: Abnormal gait, decreased activity tolerance, decreased balance, decreased endurance, decreased mobility, difficulty walking, decreased ROM, decreased strength, decreased safety awareness, improper body mechanics, postural dysfunction, and pain.   ACTIVITY LIMITATIONS: carrying, lifting, standing, squatting, stairs, transfers, bed  mobility, bathing, toileting, dressing, and locomotion level  PARTICIPATION LIMITATIONS: meal prep, cleaning, laundry, shopping, community activity,  and church  PERSONAL FACTORS: Fitness and Past/current experiences are also affecting patient's functional outcome.   REHAB POTENTIAL: Good  CLINICAL DECISION MAKING: Evolving/moderate complexity  EVALUATION COMPLEXITY: Moderate   GOALS: Goals reviewed with patient? Yes  LONG TERM GOALS: Target date: 07/24/23  1.  Pt. Will increase B LE muscle strength 1/2 muscle grade to improve pain-free mobility/ walking.   Baseline:  See above, 10/22: see above for updated scores Goal status: Partially met (see above for updated MMT)  2. Pt will demonstrate ascending/descending at least 4 stairs with reciprocal pattern and no UE assist to improve independence with community mobility.  Baseline: step-to pattern with BUE assist.  4/30: recip. Gait pattern requiring UE assist  Goal status: Partially met  3. Pt will be able to ambulate at least 500 feet during while using the QC in order to show improvement in walking tolerance and community mobility.  Baseline: 11/11: 305 feet with QC.  12/31: 250 feet with QC and progressing to no assistive device for short distances.  4/30: pt. Ambulates 185 feet with no assistive device.    Goal status: Not met (progressing)  4.  Pt. Able to vacuum living room with good LE control and no LOB to improve independence.    Baseline: pt. Received vacuum for Christmas but dtr. Continues to vacuum at this time.   Goal status: Not met  5.  Pt. Able to ambulate from living to kitchen/ bathroom with mod. I and no assistive safety to improve independence/ functional mobility.  Baseline: pt. Starting to amb. With CGA in clinic without QC  Goal status: Not met  PLAN:  PT FREQUENCY: 1-2x/week  PT DURATION: 6 weeks  PLANNED INTERVENTIONS: Therapeutic exercises, Therapeutic activity, Neuromuscular re-education,  Balance training, Gait training, Patient/Family education, Self Care, Joint mobilization, Stair training, Prosthetic training, DME instructions, Manual therapy, and Re-evaluation  PLAN FOR NEXT SESSION: Progress gait training to use of QC/ no assistive device with mod. I.  COMPLETE .    Lendell Quarry, PT, DPT # [phone] Physical Therapist - [facility] 1:33 PM,06/12/23

## 2023-06-13 ENCOUNTER — Encounter: Payer: Self-pay | Admitting: Ophthalmology

## 2023-06-17 ENCOUNTER — Ambulatory Visit: Admitting: Physical Therapy

## 2023-06-17 NOTE — Discharge Instructions (Signed)

## 2023-06-19 ENCOUNTER — Ambulatory Visit
Admission: RE | Admit: 2023-06-19 | Discharge: 2023-06-19 | Disposition: A | Discharge status: Home / Self Care | Attending: Ophthalmology | Admitting: Ophthalmology

## 2023-06-19 ENCOUNTER — Encounter
Admission: RE | Disposition: A | Discharge status: Home / Self Care | Payer: Self-pay | Source: Home / Self Care | Attending: Ophthalmology

## 2023-06-19 ENCOUNTER — Encounter: Admitting: Physical Therapy

## 2023-06-19 ENCOUNTER — Ambulatory Visit: Payer: Self-pay | Admitting: Anesthesiology

## 2023-06-19 ENCOUNTER — Other Ambulatory Visit: Payer: Self-pay

## 2023-06-19 ENCOUNTER — Encounter: Payer: Self-pay | Admitting: Ophthalmology

## 2023-06-19 DIAGNOSIS — E1136 Type 2 diabetes mellitus with diabetic cataract: Secondary | ICD-10-CM | POA: Diagnosis present

## 2023-06-19 DIAGNOSIS — H5703 Miosis: Secondary | ICD-10-CM | POA: Diagnosis not present

## 2023-06-19 DIAGNOSIS — I10 Essential (primary) hypertension: Secondary | ICD-10-CM | POA: Diagnosis not present

## 2023-06-19 DIAGNOSIS — Z79899 Other long term (current) drug therapy: Secondary | ICD-10-CM | POA: Diagnosis not present

## 2023-06-19 DIAGNOSIS — Z7984 Long term (current) use of oral hypoglycemic drugs: Secondary | ICD-10-CM | POA: Diagnosis not present

## 2023-06-19 DIAGNOSIS — H2512 Age-related nuclear cataract, left eye: Secondary | ICD-10-CM | POA: Insufficient documentation

## 2023-06-19 DIAGNOSIS — I739 Peripheral vascular disease, unspecified: Secondary | ICD-10-CM | POA: Insufficient documentation

## 2023-06-19 HISTORY — DX: Dizziness and giddiness: R42

## 2023-06-19 HISTORY — DX: Presence of artificial limb (complete) (partial), unspecified: Z97.10

## 2023-06-19 HISTORY — PX: CATARACT EXTRACTION W/PHACO: SHX586

## 2023-06-19 LAB — GLUCOSE, CAPILLARY: Glucose-Capillary: 264 mg/dL — ABNORMAL HIGH (ref 70–99)

## 2023-06-19 SURGERY — PHACOEMULSIFICATION, CATARACT, WITH IOL INSERTION
Anesthesia: Monitor Anesthesia Care | Site: Eye | Laterality: Left

## 2023-06-19 MED ORDER — SIGHTPATH DOSE#1 BSS IO SOLN
INTRAOCULAR | Status: DC | PRN
  Administered 2023-06-19: 15 mL via INTRAOCULAR

## 2023-06-19 MED ORDER — BRIMONIDINE TARTRATE-TIMOLOL 0.2-0.5 % OP SOLN
OPHTHALMIC | Status: DC | PRN
  Administered 2023-06-19: 1 [drp] via OPHTHALMIC

## 2023-06-19 MED ORDER — CEFUROXIME OPHTHALMIC INJECTION 1 MG/0.1 ML
INJECTION | OPHTHALMIC | Status: DC | PRN
  Administered 2023-06-19: 1 mg via INTRACAMERAL

## 2023-06-19 MED ORDER — SIGHTPATH DOSE#1 BSS IO SOLN
INTRAOCULAR | Status: DC | PRN
  Administered 2023-06-19: 59 mL via OPHTHALMIC

## 2023-06-19 MED ORDER — FENTANYL CITRATE (PF) 100 MCG/2ML IJ SOLN
INTRAMUSCULAR | Status: DC | PRN
  Administered 2023-06-19 (×2): 50 ug via INTRAVENOUS

## 2023-06-19 MED ORDER — SIGHTPATH DOSE#1 NA HYALUR & NA CHOND-NA HYALUR IO KIT
PACK | INTRAOCULAR | Status: DC | PRN
  Administered 2023-06-19: 1 via OPHTHALMIC

## 2023-06-19 MED ORDER — ARMC OPHTHALMIC DILATING DROPS
1.0000 | OPHTHALMIC | Status: DC | PRN
  Administered 2023-06-19 (×3): 1 via OPHTHALMIC

## 2023-06-19 MED ORDER — TETRACAINE HCL 0.5 % OP SOLN
OPHTHALMIC | Status: AC
  Filled 2023-06-19: qty 4

## 2023-06-19 MED ORDER — MIDAZOLAM HCL 2 MG/2ML IJ SOLN
INTRAMUSCULAR | Status: AC
Start: 2023-06-19 — End: ?
  Filled 2023-06-19: qty 2

## 2023-06-19 MED ORDER — FENTANYL CITRATE (PF) 100 MCG/2ML IJ SOLN
INTRAMUSCULAR | Status: AC
  Filled 2023-06-19: qty 2

## 2023-06-19 MED ORDER — ARMC OPHTHALMIC DILATING DROPS
OPHTHALMIC | Status: AC
  Filled 2023-06-19: qty 0.5

## 2023-06-19 MED ORDER — LIDOCAINE HCL (PF) 2 % IJ SOLN
INTRAOCULAR | Status: DC | PRN
  Administered 2023-06-19: 2 mL

## 2023-06-19 MED ORDER — MIDAZOLAM HCL 2 MG/2ML IJ SOLN
INTRAMUSCULAR | Status: DC | PRN
  Administered 2023-06-19 (×2): 1 mg via INTRAVENOUS

## 2023-06-19 MED ORDER — TETRACAINE HCL 0.5 % OP SOLN
1.0000 [drp] | OPHTHALMIC | Status: DC | PRN
  Administered 2023-06-19 (×3): 1 [drp] via OPHTHALMIC

## 2023-06-19 SURGICAL SUPPLY — 11 items
CATARACT SUITE SIGHTPATH (MISCELLANEOUS) ×1 IMPLANT
FEE CATARACT SUITE SIGHTPATH (MISCELLANEOUS) ×1 IMPLANT
GLOVE BIOGEL PI IND STRL 8 (GLOVE) ×1 IMPLANT
GLOVE SURG LX STRL 7.5 STRW (GLOVE) ×1 IMPLANT
GLOVE SURG PROTEXIS BL SZ6.5 (GLOVE) ×1 IMPLANT
GLOVE SURG SYN 6.5 PF PI BL (GLOVE) ×1 IMPLANT
LENS IOL TECNIS EYHANCE 22.5 (Intraocular Lens) IMPLANT
NDL FILTER BLUNT 18X1 1/2 (NEEDLE) ×1 IMPLANT
NEEDLE FILTER BLUNT 18X1 1/2 (NEEDLE) ×1 IMPLANT
RING MALYGIN 7.0 (MISCELLANEOUS) IMPLANT
SYR 3ML LL SCALE MARK (SYRINGE) ×1 IMPLANT

## 2023-06-19 NOTE — H&P (Signed)
 [facility]   Primary Care Physician:  Rory Collard, MD Ophthalmologist: Dr. Annell Kidney  Pre-Procedure History & Physical: HPI:  Melanie Juarez is a 78 y.o. female here for ophthalmic surgery.   Past Medical History:  Diagnosis Date   Diabetes (HCC)    Employs prosthetic leg    Right, below the knee   Hypertension    Vertigo    spring 2024    Past Surgical History:  Procedure Laterality Date   AMPUTATION Right 03/22/2022   Procedure: AMPUTATION BELOW KNEE-Guillotine;  Surgeon: Celso College, MD;  Location: [facility] ORS;  Service: Vascular;  Laterality: Right;   LOWER EXTREMITY ANGIOGRAPHY Right 03/21/2022   Procedure: Lower Extremity Angiography;  Surgeon: Celso College, MD;  Location: [facility] INVASIVE CV LAB;  Service: Cardiovascular;  Laterality: Right;   PARTIAL HYSTERECTOMY     STUMP REVISION Right 03/26/2022   Procedure: BKA REVISION;  Surgeon: Celso College, MD;  Location: [facility] ORS;  Service: General;  Laterality: Right;    Prior to Admission medications   Medication Sig Start Date End Date Taking? Authorizing Provider  ascorbic acid (VITAMIN C) 500 MG tablet Take 500 mg by mouth daily.   Yes [provider]  aspirin EC 81 MG tablet Take 81 mg by mouth daily. Swallow whole.   Yes [provider]  atenolol  (TENORMIN ) 50 MG tablet Take 50 mg by mouth daily. 03/19/22  Yes [provider]  atorvastatin  (LIPITOR) 40 MG tablet Take 1 tablet (40 mg total) by mouth daily. 03/29/22  Yes Amin, Sumayya, MD  chlorthalidone (HYGROTON) 25 MG tablet Take 25 mg by mouth daily.   Yes [provider]  ferrous sulfate 325 (65 FE) MG EC tablet Take by mouth.   Yes [provider]  glipiZIDE  (GLUCOTROL ) 5 MG tablet Take 0.5 tablets (2.5 mg total) by mouth 2 (two) times daily before a meal. 03/28/22  Yes Luna Salinas, MD  metFORMIN  (GLUCOPHAGE ) 500 MG tablet Take 500 mg by mouth 2 (two) times daily.   Yes [provider]   acetaminophen  (TYLENOL ) 500 MG tablet Take 500 mg by mouth every 6 (six) hours as needed.    [provider]  mupirocin  ointment (BACTROBAN ) 2 % Apply 1 Application topically 2 (two) times daily. 06/08/22   Brown, Fallon E, NP  ondansetron  (ZOFRAN -ODT) 4 MG disintegrating tablet Take 1 tablet (4 mg total) by mouth every 8 (eight) hours as needed for nausea or vomiting. Patient not taking: Reported on 06/13/2023 07/08/22   Bryson Carbine, MD  [facility] ULTRA test strip USE 1 STRIP DAILY AS DIRECTED 04/17/22   [provider]  mupirocin  cream (BACTROBAN ) 2 % Apply 1 Application topically 2 (two) times daily. 06/07/22   Brown, Fallon E, NP    Allergies as of 06/04/2023 - Review Complete 05/27/2023  Allergen Reaction Noted   Tilactase Diarrhea 07/02/2017   Tomato Rash 04/25/2015    History reviewed. No pertinent family history.  Social History   Socioeconomic History   Marital status: Divorced    Spouse name: Not on file   Number of children: Not on file   Years of education: Not on file   Highest education level: Not on file  Occupational History   Not on file  Tobacco Use   Smoking status: Never   Smokeless tobacco: Never  Vaping Use   Vaping status: Never Used  Substance and Sexual Activity   Alcohol use: Never   Drug use: Never   Sexual  activity: Not Currently  Other Topics Concern   Not on file  Social History Narrative   Not on file   Social Drivers of Health   Financial Resource Strain: Patient Declined (02/27/2023)   Received from [facility]   Overall Financial Resource Strain (CARDIA)    Difficulty of Paying Living Expenses: Patient declined  Food Insecurity: Patient Declined (02/27/2023)   Received from [facility]   Hunger Vital Sign    Worried About Running Out of Food in the Last Year: Patient declined    Ran Out of Food in the Last Year: Patient declined  Transportation Needs: Patient Declined (02/27/2023)    Received from [facility] - Transportation    In the past 12 months, has lack of transportation kept you from medical appointments or from getting medications?: Patient declined    Lack of Transportation (Non-Medical): Patient declined  Physical Activity: Not on file  Stress: Not on file  Social Connections: Not on file  Intimate Partner Violence: Not At Risk (03/20/2022)   Humiliation, Afraid, Rape, and Kick questionnaire    Fear of Current or Ex-Partner: No    Emotionally Abused: No    Physically Abused: No    Sexually Abused: No    Review of Systems: See HPI, otherwise negative ROS  Physical Exam: BP (!) 195/79   Temp (!) 97.2 F (36.2 C) (Temporal)   Ht 5' 7.01" (1.702 m)   Wt 93.1 kg   SpO2 100%   BMI 32.13 kg/m  General:   Alert,  pleasant and cooperative in NAD Head:  Normocephalic and atraumatic. Lungs:  Clear to auscultation.    Heart:  Regular rate and rhythm.   Impression/Plan: Melanie Juarez is here for ophthalmic surgery.  Risks, benefits, limitations, and alternatives regarding ophthalmic surgery have been reviewed with the patient.  Questions have been answered.  All parties agreeable.   Annell Kidney, MD  06/19/2023, 7:35 AM

## 2023-06-19 NOTE — Transfer of Care (Signed)
 Immediate Anesthesia Transfer of Care Note  Patient: Melanie Juarez  Procedure(s) Performed: PHACOEMULSIFICATION, CATARACT, WITH IOL INSERTION 7.76 00:39.9 (Left: Eye)  Patient Location: PACU  Anesthesia Type: MAC  Level of Consciousness: awake, alert  and patient cooperative  Airway and Oxygen Therapy: Patient Spontanous Breathing and Patient connected to supplemental oxygen  Post-op Assessment: Post-op Vital signs reviewed, Patient's Cardiovascular Status Stable, Respiratory Function Stable, Patent Airway and No signs of Nausea or vomiting  Post-op Vital Signs: Reviewed and stable  Complications: No notable events documented.

## 2023-06-19 NOTE — Op Note (Signed)
 OPERATIVE NOTE  Melanie Juarez [id number] 06/19/2023  PREOPERATIVE DIAGNOSIS:   Nuclear sclerotic cataract left eye with miotic pupil      H25.12   POSTOPERATIVE DIAGNOSIS:   Nuclear sclerotic cataract left eye with miotic pupil.     PROCEDURE:  Phacoemulsification with posterior chamber intraocular lens implantation of the left eye which required pupil stretching with the Malyugin pupil expansion device  Ultrasound time: Procedure(s): PHACOEMULSIFICATION, CATARACT, WITH IOL INSERTION 7.76 00:39.9 (Left)  LENS:   Implant Name Type Inv. Item Serial No. Manufacturer Lot No. LRB No. Used Action  LENS IOL TECNIS EYHANCE 22.5 - [id number] Intraocular Lens LENS IOL TECNIS EYHANCE 22.5 [id number] [employer]  Left 1 Implanted         SURGEON:  Berline Brenner, MD   ANESTHESIA: Topical with tetracaine drops and 2% Xylocaine  jelly, augmented with 1% preservative-free intracameral lidocaine .   COMPLICATIONS:  None.   DESCRIPTION OF PROCEDURE:  The patient was identified in the holding room and transported to the operating room and placed in the supine position under the operating microscope.  The left eye was identified as the operative eye and it was prepped and draped in the usual sterile ophthalmic fashion.   A 1 millimeter clear-corneal paracentesis was made at the 1:30 position.  The anterior chamber was filled with Viscoat viscoelastic.  0.5 ml of preservative-free 1% lidocaine  was injected into the anterior chamber.  A 2.4 millimeter keratome was used to make a near-clear corneal incision at the 10:30 position.  A Malyugin pupil expander was then placed through the main incision and into the anterior chamber of the eye.  The edge of the iris was secured on the lip of the pupil expander and it was released, thereby expanding the pupil to approximately 7 millimeters for completion of the cataract surgery.  Additional Viscoat was placed in the anterior chamber.  A cystotome and capsulorrhexis  forceps were used to make a curvilinear capsulorrhexis.   Balanced salt solution was used to hydrodissect and hydrodelineate the lens nucleus.   Phacoemulsification was used in stop and chop fashion to remove the lens, nucleus and epinucleus.  The remaining cortex was aspirated using the irrigation aspiration handpiece.  Additional Provisc was placed into the eye to distend the capsular bag for lens placement.  A lens was then injected into the capsular bag.  The pupil expanding ring was removed using a Kuglen hook and insertion device. The remaining viscoelastic was aspirated from the capsular bag and the anterior chamber.  The anterior chamber was filled with balanced salt solution to inflate to a physiologic pressure.   Wounds were hydrated with balanced salt solution.  The anterior chamber was inflated to a physiologic pressure with balanced salt solution.  No wound leaks were noted. Cefuroxime 0.1 ml of a 10mg /ml solution was injected into the anterior chamber for a dose of 1 mg of intracameral antibiotic at the completion of the case.   Timolol and Brimonidine drops were applied to the eye.  The patient was taken to the recovery room in stable condition without complications of anesthesia or surgery.  [name] 06/19/2023, 8:17 AM

## 2023-06-19 NOTE — Anesthesia Preprocedure Evaluation (Signed)
 Anesthesia Evaluation  Patient identified by MRN, date of birth, ID band Patient awake    Reviewed: Allergy & Precautions, NPO status , Patient's Chart, lab work & pertinent test results  History of Anesthesia Complications Negative for: history of anesthetic complications  Airway Mallampati: II  TM Distance: >3 FB Neck ROM: full    Dental  (+) Chipped, Poor Dentition, Dental Advidsory Given   Pulmonary neg pulmonary ROS   Pulmonary exam normal        Cardiovascular hypertension, On Medications (-) angina + Peripheral Vascular Disease  (-) Past MI and (-) Cardiac Stents Normal cardiovascular exam(-) dysrhythmias (-) Valvular Problems/Murmurs     Neuro/Psych negative neurological ROS  negative psych ROS   GI/Hepatic negative GI ROS, Neg liver ROS,,,  Endo/Other  diabetes, Poorly Controlled, Oral Hypoglycemic Agents    Renal/GU   negative genitourinary   Musculoskeletal Osteomyelitis    Abdominal   Peds  Hematology negative hematology ROS (+)   Anesthesia Other Findings Past Medical History: No date: Diabetes (HCC) No date: Hypertension  Past Surgical History: 03/21/2022: LOWER EXTREMITY ANGIOGRAPHY; Right     Comment:  Procedure: Lower Extremity Angiography;  Surgeon: Celso College, MD;  Location: [facility] INVASIVE CV LAB;  Service:               Cardiovascular;  Laterality: Right;  BMI    Body Mass Index: 30.86 kg/m      Reproductive/Obstetrics negative OB ROS                             Anesthesia Physical Anesthesia Plan  ASA: 3  Anesthesia Plan: MAC   Post-op Pain Management:    Induction: Intravenous  PONV Risk Score and Plan: 2 and Midazolam   Airway Management Planned: Natural Airway and Nasal Cannula  Additional Equipment:   Intra-op Plan:   Post-operative Plan:   Informed Consent: I have reviewed the patients History and Physical, chart, labs and  discussed the procedure including the risks, benefits and alternatives for the proposed anesthesia with the patient or authorized representative who has indicated his/her understanding and acceptance.     Dental Advisory Given  Plan Discussed with: Anesthesiologist, CRNA and Surgeon  Anesthesia Plan Comments: (Patient consented for risks of anesthesia including but not limited to:  - adverse reactions to medications - damage to eyes, teeth, lips or other oral mucosa - nerve damage due to positioning  - sore throat or hoarseness - Damage to heart, brain, nerves, lungs, other parts of body or loss of life  Patient voiced understanding.)        Anesthesia Quick Evaluation

## 2023-06-19 NOTE — Anesthesia Postprocedure Evaluation (Signed)
 Anesthesia Post Note  Patient: Melanie Juarez  Procedure(s) Performed: PHACOEMULSIFICATION, CATARACT, WITH IOL INSERTION 7.76 00:39.9 (Left: Eye)  Patient location during evaluation: PACU Anesthesia Type: MAC Level of consciousness: awake and alert Pain management: pain level controlled Vital Signs Assessment: post-procedure vital signs reviewed and stable Respiratory status: spontaneous breathing, nonlabored ventilation, respiratory function stable and patient connected to nasal cannula oxygen Cardiovascular status: stable and blood pressure returned to baseline Postop Assessment: no apparent nausea or vomiting Anesthetic complications: no   No notable events documented.   Last Vitals:  Vitals:   06/19/23 0818 06/19/23 0822  BP: (!) 159/87 (!) 153/71  Pulse: 79 80  Resp: 14 11  Temp: (!) 36.2 C (!) 36.2 C  SpO2: 99% 98%    Last Pain:  Vitals:   06/19/23 0822  TempSrc:   PainSc: 0-No pain                 Vanice Genre

## 2023-06-24 ENCOUNTER — Ambulatory Visit: Admitting: Physical Therapy

## 2023-06-26 ENCOUNTER — Ambulatory Visit: Admitting: Physical Therapy

## 2023-07-01 ENCOUNTER — Ambulatory Visit: Admitting: Physical Therapy

## 2023-07-03 ENCOUNTER — Ambulatory Visit: Admitting: Physical Therapy

## 2023-07-10 ENCOUNTER — Encounter: Admitting: Physical Therapy

## 2023-07-11 ENCOUNTER — Ambulatory Visit: Admitting: Physical Therapy

## 2023-07-12 ENCOUNTER — Encounter (INDEPENDENT_AMBULATORY_CARE_PROVIDER_SITE_OTHER): Payer: Self-pay | Admitting: Vascular Surgery

## 2023-07-12 ENCOUNTER — Ambulatory Visit (INDEPENDENT_AMBULATORY_CARE_PROVIDER_SITE_OTHER): Admitting: Vascular Surgery

## 2023-07-12 VITALS — BP 150/81 | HR 78 | Resp 16

## 2023-07-12 DIAGNOSIS — L97509 Non-pressure chronic ulcer of other part of unspecified foot with unspecified severity: Secondary | ICD-10-CM

## 2023-07-12 DIAGNOSIS — I739 Peripheral vascular disease, unspecified: Secondary | ICD-10-CM

## 2023-07-12 DIAGNOSIS — E11621 Type 2 diabetes mellitus with foot ulcer: Secondary | ICD-10-CM | POA: Diagnosis not present

## 2023-07-12 DIAGNOSIS — Z89511 Acquired absence of right leg below knee: Secondary | ICD-10-CM | POA: Diagnosis not present

## 2023-07-12 DIAGNOSIS — I10 Essential (primary) hypertension: Secondary | ICD-10-CM

## 2023-07-12 NOTE — Progress Notes (Signed)
 MRN : [id number]  Melanie Juarez is a 78 y.o. ([birth year]) female who presents with chief complaint of  Chief Complaint  Patient presents with   Follow-up    Prothesis rx for hanger clinc  .  History of Present Illness: Patient returns today in follow up of her BKA prosthesis.  She has had a size change in her right below-knee amputation site and her prosthesis is no longer fitting well.  It is rubbing to an adjustment over a new prosthesis altogether.  Her prosthetists has done an excellent job working with her to keep this functioning forward.  No fevers or chills.  No left leg symptoms currently.  Current Outpatient Medications  Medication Sig Dispense Refill   acetaminophen  (TYLENOL ) 500 MG tablet Take 500 mg by mouth every 6 (six) hours as needed.     ascorbic acid (VITAMIN C) 500 MG tablet Take 500 mg by mouth daily.     aspirin EC 81 MG tablet Take 81 mg by mouth daily. Swallow whole.     atenolol  (TENORMIN ) 50 MG tablet Take 50 mg by mouth daily.     atorvastatin  (LIPITOR) 40 MG tablet Take 1 tablet (40 mg total) by mouth daily.     chlorthalidone (HYGROTON) 25 MG tablet Take 25 mg by mouth daily.     ferrous sulfate 325 (65 FE) MG EC tablet Take by mouth.     glipiZIDE  (GLUCOTROL ) 5 MG tablet Take 0.5 tablets (2.5 mg total) by mouth 2 (two) times daily before a meal.     metFORMIN  (GLUCOPHAGE ) 500 MG tablet Take 500 mg by mouth 2 (two) times daily.     mupirocin  ointment (BACTROBAN ) 2 % Apply 1 Application topically 2 (two) times daily. 22 g 3   ONETOUCH ULTRA test strip USE 1 STRIP DAILY AS DIRECTED     ondansetron  (ZOFRAN -ODT) 4 MG disintegrating tablet Take 1 tablet (4 mg total) by mouth every 8 (eight) hours as needed for nausea or vomiting. (Patient not taking: Reported on 06/13/2023) 20 tablet 0   No current facility-administered medications for this visit.    Past Medical History:  Diagnosis Date   Diabetes (HCC)    Employs prosthetic leg    Right, below the knee    Hypertension    Vertigo    spring 2024    Past Surgical History:  Procedure Laterality Date   AMPUTATION Right 03/22/2022   Procedure: AMPUTATION BELOW KNEE-Guillotine;  Surgeon: Celso College, MD;  Location: [facility];  Service: Vascular;  Laterality: Right;   CATARACT EXTRACTION W/PHACO Left 06/19/2023   Procedure: PHACOEMULSIFICATION, CATARACT, WITH IOL INSERTION 7.76 00:39.9;  Surgeon: Annell Kidney, MD;  Location: [facility];  Service: Ophthalmology;  Laterality: Left;   LOWER EXTREMITY ANGIOGRAPHY Right 03/21/2022   Procedure: Lower Extremity Angiography;  Surgeon: Celso College, MD;  Location: [facility] IN[facility];  Service: Cardiovascular;  Laterality: Right;   PARTIAL HYSTERECTOMY     STUMP REVISION Right 03/26/2022   Procedure: BKA REVISION;  Surgeon: Celso College, MD;  Location: [facility];  Service: General;  Laterality: Right;     Social History   Tobacco Use   Smoking status: Never   Smokeless tobacco: Never  Vaping Use   Vaping status: Never Used  Substance Use Topics   Alcohol use: Never   Drug use: Never       No family history on file.   Allergies  Allergen Reactions   Milk-Related Compounds Nausea And Vomiting  Tilactase Diarrhea   Tomato Rash    Mouth breaks out     REVIEW OF SYSTEMS (Negative unless checked)  Constitutional: [] Weight loss  [] Fever  [] Chills Cardiac: [] Chest pain   [] Chest pressure   [] Palpitations   [] Shortness of breath when laying flat   [] Shortness of breath at rest   [] Shortness of breath with exertion. Vascular:  [x] Pain in legs with walking   [] Pain in legs at rest   [] Pain in legs when laying flat   [x] Claudication   [] Pain in feet when walking  [] Pain in feet at rest  [] Pain in feet when laying flat   [] History of DVT   [] Phlebitis   [] Swelling in legs   [] Varicose veins   [] Non-healing ulcers Pulmonary:   [] Uses home oxygen   [] Productive cough   [] Hemoptysis   [] Wheeze  [] COPD   [] Asthma Neurologic:   [x] Dizziness  [] Blackouts   [] Seizures   [] History of stroke   [] History of TIA  [] Aphasia   [] Temporary blindness   [] Dysphagia   [] Weakness or numbness in arms   [] Weakness or numbness in legs Musculoskeletal:  [] Arthritis   [] Joint swelling   [x] Joint pain   [] Low back pain Hematologic:  [] Easy bruising  [] Easy bleeding   [] Hypercoagulable state   [] Anemic   Gastrointestinal:  [] Blood in stool   [] Vomiting blood  [] Gastroesophageal reflux/heartburn   [] Abdominal pain Genitourinary:  [] Chronic kidney disease   [] Difficult urination  [] Frequent urination  [] Burning with urination   [] Hematuria Skin:  [] Rashes   [] Ulcers   [] Wounds Psychological:  [] History of anxiety   []  History of major depression.  Physical Examination  BP (!) 150/81   Pulse 78   Resp 16  Gen:  WD/WN, NAD Head: [place]/AT, No temporalis wasting. Ear/Nose/Throat: Hearing grossly intact, nares w/o erythema or drainage Eyes: Conjunctiva clear. Sclera non-icteric Neck: Supple.  Trachea midline Pulmonary:  Good air movement, no use of accessory muscles.  Cardiac: RRR, no JVD Vascular:  Vessel Right Left  Radial Palpable Palpable                          PT Not Palpable 1+ Palpable  DP Not Palpable 1+ Palpable   Gastrointestinal: soft, non-tender/non-distended. No guarding/reflex.  Musculoskeletal: M/S 5/5 throughout.  No deformity or atrophy. Right BKA with prosthesis.  Wound is well healed. No erythema or ulceration. No LLE edema. Neurologic: Sensation grossly intact in extremities.  Symmetrical.  Speech is fluent.  Psychiatric: Judgment intact, Mood & affect appropriate for pt's clinical situation. Dermatologic: No rashes or ulcers noted.  No cellulitis or open wounds.      Labs Recent Results (from the past 2160 hours)  Glucose, capillary     Status: Abnormal   Collection Time: 06/19/23  7:16 AM  Result Value Ref Range   Glucose-Capillary 264 (H) 70 - 99 mg/dL    Comment: Glucose reference range applies  only to samples taken after fasting for at least 8 hours.    Radiology No results found.  Assessment/Plan  Hx of BKA, right (HCC) Patient has had a status change with her right BKA stump and her prosthesis is no longer fitting well.  A new prescription was given for her for a right BKA prosthesis.  She is taking this to her prosthetists.  She has a follow-up to check her ABIs next year.  PAD (peripheral artery disease) (HCC) Check left ABI on follow up visit next year   Type  2 diabetes mellitus (HCC) blood glucose control important in reducing the progression of atherosclerotic disease. Also, involved in wound healing. On appropriate medications.     Essential hypertension blood pressure control important in reducing the progression of atherosclerotic disease. On appropriate oral medications.  Mikki Alexander, MD  07/12/2023 11:52 AM    This note was created with Dragon medical transcription system.  Any errors from dictation are purely unintentional

## 2023-07-12 NOTE — Assessment & Plan Note (Signed)
 Patient has had a status change with her right BKA stump and her prosthesis is no longer fitting well.  A new prescription was given for her for a right BKA prosthesis.  She is taking this to her prosthetists.  She has a follow-up to check her ABIs next year.

## 2023-07-18 ENCOUNTER — Ambulatory Visit: Attending: Vascular Surgery | Admitting: Physical Therapy

## 2023-07-18 ENCOUNTER — Encounter: Payer: Self-pay | Admitting: Physical Therapy

## 2023-07-18 DIAGNOSIS — R269 Unspecified abnormalities of gait and mobility: Secondary | ICD-10-CM | POA: Diagnosis present

## 2023-07-18 DIAGNOSIS — M6281 Muscle weakness (generalized): Secondary | ICD-10-CM | POA: Insufficient documentation

## 2023-07-18 DIAGNOSIS — R262 Difficulty in walking, not elsewhere classified: Secondary | ICD-10-CM | POA: Insufficient documentation

## 2023-07-18 DIAGNOSIS — R2681 Unsteadiness on feet: Secondary | ICD-10-CM | POA: Diagnosis present

## 2023-07-18 DIAGNOSIS — Z89511 Acquired absence of right leg below knee: Secondary | ICD-10-CM | POA: Insufficient documentation

## 2023-07-18 NOTE — Therapy (Signed)
 OUTPATIENT PHYSICAL THERAPY LOWER EXTREMITY TREATMENT  Patient Name: Melanie Juarez MRN: [id number] DOB:01/30/1946, 78 y.o., female Today's Date: 07/18/2023  END OF SESSION:  PT End of Session - 07/18/23 1725     Visit Number 46    Number of Visits 57    Date for PT Re-Evaluation 07/24/23    PT Start Time 1725    PT Stop Time 1816    PT Time Calculation (min) 51 min    Equipment Utilized During Treatment Gait belt    Activity Tolerance Patient tolerated treatment well    Behavior During Therapy [facility] for tasks assessed/performed            Past Medical History:  Diagnosis Date   Diabetes (HCC)    Employs prosthetic leg    Right, below the knee   Hypertension    Vertigo    spring 2024   Past Surgical History:  Procedure Laterality Date   AMPUTATION Right 03/22/2022   Procedure: AMPUTATION BELOW KNEE-Guillotine;  Surgeon: Celso College, MD;  Location: [facility];  Service: Vascular;  Laterality: Right;   CATARACT EXTRACTION W/PHACO Left 06/19/2023   Procedure: PHACOEMULSIFICATION, CATARACT, WITH IOL INSERTION 7.76 00:39.9;  Surgeon: Annell Kidney, MD;  Location: [facility];  Service: Ophthalmology;  Laterality: Left;   LOWER EXTREMITY ANGIOGRAPHY Right 03/21/2022   Procedure: Lower Extremity Angiography;  Surgeon: Celso College, MD;  Location: [facility];  Service: Cardiovascular;  Laterality: Right;   PARTIAL HYSTERECTOMY     STUMP REVISION Right 03/26/2022   Procedure: BKA REVISION;  Surgeon: Celso College, MD;  Location: [facility];  Service: General;  Laterality: Right;   Patient Active Problem List   Diagnosis Date Noted   Hx of BKA, right (HCC) 08/08/2022   Lactic acidosis 03/21/2022   Obesity (BMI 30-39.9) 03/21/2022   PAD (peripheral artery disease) (HCC) 03/21/2022   Osteomyelitis (HCC) 03/19/2022   Diabetic infection of right foot (HCC) 03/19/2022   Essential hypertension 03/19/2022   Leukocytosis 03/19/2022   AKI (acute kidney injury)  (HCC) 03/19/2022   Sepsis with acute renal failure without septic shock (HCC) 03/19/2022   Hyperlipidemia 05/17/2014   Type 2 diabetes mellitus (HCC) 05/17/2014   Anemia, unspecified 05/18/2013   Benign essential hypertension 05/18/2013   REFERRING PROVIDER: Celso College, MD  REFERRING DIAG: s/p R BKA  THERAPY DIAG:  Difficulty in walking, not elsewhere classified  Hx of BKA, right (HCC)  Muscle weakness (generalized)  Unsteadiness on feet  Gait difficulty  Rationale for Evaluation and Treatment: Rehabilitation  ONSET DATE: 03/22/22  SUBJECTIVE:   SUBJECTIVE STATEMENT: Pt. Had a sore on R foot resulting in R BKA on 03/22/22.  Pt. Entered Pt with use of w/c and daughter Ira Mann) present.  Pt. Wants to return to living independently.  Pt. Completing HEP from rehab center.  Pt. Has been unable to get into shower.    PERTINENT HISTORY: Melanie Juarez is a 78 y.o. (02-Jul-1945) female who presents with chief complaint of     Chief Complaint  Patient presents with   Venous Insufficiency  .   History of Present Illness: Patient returns today in follow up of her right BKA.  It is healed.  She is in the process of getting her prosthesis now.  She is doing well.  No pain.  No left leg ulceration or rest pain.    Assessment/Plan   PAD (peripheral artery disease) (HCC) Check left ABI on follow up  visit in 3 months.    Type 2 diabetes mellitus (HCC) blood glucose control important in reducing the progression of atherosclerotic disease. Also, involved in wound healing. On appropriate medications.    Essential hypertension blood pressure control important in reducing the progression of atherosclerotic disease. On appropriate oral medications.    Hx of BKA, right (HCC) Healed.  In the process of getting her prosthesis.  A new prescription given for physical therapy for prosthesis and ambulatory training.   Mikki Alexander, MD   08/08/2022 10:42 AM  PAIN:  Are you having pain? No.  Pt.  Reports phantom limb symptoms but not pain.    PRECAUTIONS: Fall  RED FLAGS: None   WEIGHT BEARING RESTRICTIONS: No  FALLS:  Has patient fallen in last 6 months? No  LIVING ENVIRONMENT: Lives with: lives with their daughter  Ira Mann).   Lives in: House/apartment Stairs: No Has following equipment at home: Otho Blitz - 2 wheeled, Wheelchair (manual), and Ramped entry  OCCUPATION: Retired  PLOF: Independent.  Pt. Does not drive.    PATIENT GOALS: Ambulate with least assistive device.  Return home independently/ go back to church.    NEXT MD VISIT: Wava Hagedorn 7/31.  MD f/u in September?  OBJECTIVE:   PATIENT SURVEYS:  FOTO initial 29/ goal 47.    COGNITION: Overall cognitive status: Within functional limits for tasks assessed   SENSATION: WFL  EDEMA:  No R residual limb swelling noted.  PT discussed use of shrinker and proper use of ply socks for improved fit of prosthetic leg.    POSTURE: rounded shoulders and forward head  PALPATION: Minimal tenderness along R distal residual limb/ incision.  Good incision healing/ PT discussed benefits of scar massage.    LOWER EXTREMITY ROM:  Active ROM Right eval Left eval  Hip flexion [facility] WFL  Hip extension NT NT  Hip abduction [facility]  Hip adduction    Hip internal rotation    Hip external rotation    Knee flexion [facility] WFL  Knee extension -3 deg. 0 deg.  Ankle dorsiflexion    Ankle plantarflexion    Ankle inversion    Ankle eversion     (Blank rows = not tested)  LOWER EXTREMITY MMT:  MMT Right eval Left eval  Hip flexion 4/5 4/5  Hip extension    Hip abduction 3+/5 4/5  Hip adduction    Hip internal rotation    Hip external rotation    Knee flexion 4+/5  4+/5  Knee extension 4+/5 5/5  Ankle dorsiflexion    Ankle plantarflexion    Ankle inversion    Ankle eversion     (Blank rows = not tested)   B UE AROM WFL.  B UE strength grossly 4/5 MMT except bicep/tricep 4+/5 MMT  FUNCTIONAL TESTS:  5 times sit  to stand: TBD Timed up and go (TUG): TBD 6 minute walk test: TBD  GAIT: Distance walked: 45 feet in gym with RW/ min. A for safety and cuing Assistive device utilized: Walker - 2 wheeled Level of assistance: Min A Comments: amb. In gym with use of RW/min A for cuing to increase step length/ heel strike.  Pt. Fearful while walking outside of //-bars.  Pt. Ambs. In //-bars with B UE assist and increase cadence/ step pattern.  Use of mirror for posture correction/ heel strike (pt. Does not looking at a mirror).    : 305 feet with SPC - pt's prosthetic started sliding into extreme ER, time paused briefly  half-way through to allow pt time to reapply the leg  FOTO: 60 (goal met)  B hip strength: flexion 4/5 MMT, L quad 5, R quad 4, B hamstring 4+/5.    TODAY'S TREATMENT:                                                                                                                              DATE: 07/18/2023    Subjective:  Pt. entered PT gym with use of QC and R prosthetic leg donned.  Pt. Has not been seen by PT over past month secondary cataract surgery/ illness.  Pt. Scheduled to see Wava Hagedorn to adjust prosthetic leg in a couple weeks.    Treatment:   There.ex.:  No charge  Nustep L4 10 min. B UE/LE.  Discussed weekend activities/ fit of prosthetic leg.  There.act.:  Walking in clinic/ gym with QC and gait reassessment.  Cuing to increase step length/ heel strike, esp. On L LE.  Progressing gait in clinic without use of QC and CGA for safety, esp. Stepping over thresholds/ turning.    Walking in //-bars: 6"  step overs (recip. Pattern)- 1 UE assist on //-bars for safety.  Focus on prevent R hip circumduction.  Attempted no UE with step to pattern and extra time for L hip flexion.     STS from gray chair in stairs with SBA/CGA for safety and verbal cuing. Sit to stands with 1 UE assist for safety.  Pt. Challenged with no UE and benefits from 1 UE assist for safety.      Walking  in gym/ hallway (205 feet) with no assistive device and focus on consistent recip. Gait pattern.  Pt. still requires occasional VC's for increased step length with LLE.   Pt. Requires CGA while walking with no assistive device for safety/ cuing.  Occasional reaching for wall in hallway to check balance.  Pt. Walked outside to car with no assistive device and CGA for safety.  Good clearance over door threshold/ carpet.      PATIENT EDUCATION:  Education details: Discussed HEP/ standing hip ex. At [employer] Person educated: Patient and Child(ren) Education method: Medical illustrator Education comprehension: verbalized understanding and returned demonstration  HOME EXERCISE PROGRAM: Access Code: [email or web address] URL: [email or web address] Date: 08/30/2022 Prepared by: Janine Melbourne  Exercises - Sit to Stand with Counter Support  - 1 x daily - 5 x weekly - 3 sets - 10 reps - Standing Hip Abduction with Counter Support  - 1 x daily - 5 x weekly - 3 sets - 10 reps - Standing March with Counter Support  - 1 x daily - 5 x weekly - 3 sets - 10 reps - Standing Hip Extension with Counter Support  - 1 x daily - 5 x weekly - 3 sets - 10 reps  ASSESSMENT:  CLINICAL IMPRESSION:        Pt. Arrived to treatment session ready to participate.  Pt.  States she has less endurance/ energy due to recent illness.  Pt. Ambulates with mod. Independence and use of QC and short distances without assistive device.  Pt. Ambulates with increase distance walked in hallway/gym without use of QC.  Pt. will continue to benefit from skilled PT services to improve her ambulation and function with using a less restrictive assistive device (quad cane) and return her to her PLOF.   OBJECTIVE IMPAIRMENTS: Abnormal gait, decreased activity tolerance, decreased balance, decreased endurance, decreased mobility, difficulty walking, decreased ROM, decreased strength, decreased safety awareness, improper body  mechanics, postural dysfunction, and pain.   ACTIVITY LIMITATIONS: carrying, lifting, standing, squatting, stairs, transfers, bed mobility, bathing, toileting, dressing, and locomotion level  PARTICIPATION LIMITATIONS: meal prep, cleaning, laundry, shopping, community activity, and church  PERSONAL FACTORS: Fitness and Past/current experiences are also affecting patient's functional outcome.   REHAB POTENTIAL: Good  CLINICAL DECISION MAKING: Evolving/moderate complexity  EVALUATION COMPLEXITY: Moderate   GOALS: Goals reviewed with patient? Yes  LONG TERM GOALS: Target date: 07/24/23  1.  Pt. Will increase B LE muscle strength 1/2 muscle grade to improve pain-free mobility/ walking.   Baseline:  See above, 10/22: see above for updated scores Goal status: Partially met (see above for updated MMT)  2. Pt will demonstrate ascending/descending at least 4 stairs with reciprocal pattern and no UE assist to improve independence with community mobility.  Baseline: step-to pattern with BUE assist.  4/30: recip. Gait pattern requiring UE assist  Goal status: Partially met  3. Pt will be able to ambulate at least 500 feet during while using the QC in order to show improvement in walking tolerance and community mobility.  Baseline: 11/11: 305 feet with QC.  12/31: 250 feet with QC and progressing to no assistive device for short distances.  4/30: pt. Ambulates 185 feet with no assistive device.    Goal status: Not met (progressing)  4.  Pt. Able to vacuum living room with good LE control and no LOB to improve independence.    Baseline: pt. Received vacuum for Christmas but dtr. Continues to vacuum at this time.   Goal status: Not met  5.  Pt. Able to ambulate from living to kitchen/ bathroom with mod. I and no assistive safety to improve independence/ functional mobility.  Baseline: pt. Starting to amb. With CGA in clinic without QC  Goal status: Not met  PLAN:  PT FREQUENCY:  1-2x/week  PT DURATION: 6 weeks  PLANNED INTERVENTIONS: Therapeutic exercises, Therapeutic activity, Neuromuscular re-education, Balance training, Gait training, Patient/Family education, Self Care, Joint mobilization, Stair training, Prosthetic training, DME instructions, Manual therapy, and Re-evaluation  PLAN FOR NEXT SESSION:  1 more tx. Session.  CHECK GOALS  Lendell Quarry, PT, DPT # [id number] Physical Therapist - [place]  [facility] 6:40 PM,07/18/23

## 2023-07-23 ENCOUNTER — Encounter: Payer: Self-pay | Admitting: Physical Therapy

## 2023-07-23 ENCOUNTER — Ambulatory Visit: Admitting: Physical Therapy

## 2023-07-23 DIAGNOSIS — R262 Difficulty in walking, not elsewhere classified: Secondary | ICD-10-CM | POA: Diagnosis not present

## 2023-07-23 DIAGNOSIS — R269 Unspecified abnormalities of gait and mobility: Secondary | ICD-10-CM

## 2023-07-23 DIAGNOSIS — Z89511 Acquired absence of right leg below knee: Secondary | ICD-10-CM

## 2023-07-23 DIAGNOSIS — R2681 Unsteadiness on feet: Secondary | ICD-10-CM

## 2023-07-23 DIAGNOSIS — M6281 Muscle weakness (generalized): Secondary | ICD-10-CM

## 2023-07-23 NOTE — Therapy (Signed)
 OUTPATIENT PHYSICAL THERAPY LOWER EXTREMITY TREATMENT/ RECERTIFICATION  Patient Name: Melanie Juarez MRN: [id number] DOB:1945-12-13, 78 y.o., female Today's Date: 07/24/2023  END OF SESSION:  PT End of Session - 07/23/23 1731     Visit Number 47    Number of Visits 55    Date for PT Re-Evaluation 09/17/23    PT Start Time 1724    PT Stop Time 1815    PT Time Calculation (min) 51 min    Equipment Utilized During Treatment Gait belt    Activity Tolerance Patient tolerated treatment well    Behavior During Therapy WFL for tasks assessed/performed            Past Medical History:  Diagnosis Date   Diabetes (HCC)    Employs prosthetic leg    Right, below the knee   Hypertension    Vertigo    spring 2024   Past Surgical History:  Procedure Laterality Date   AMPUTATION Right 03/22/2022   Procedure: AMPUTATION BELOW KNEE-Guillotine;  Surgeon: Celso College, MD;  Location: [facility];  Service: Vascular;  Laterality: Right;   CATARACT EXTRACTION W/PHACO Left 06/19/2023   Procedure: PHACOEMULSIFICATION, CATARACT, WITH IOL INSERTION 7.76 00:39.9;  Surgeon: Annell Kidney, MD;  Location: [facility];  Service: Ophthalmology;  Laterality: Left;   LOWER EXTREMITY ANGIOGRAPHY Right 03/21/2022   Procedure: Lower Extremity Angiography;  Surgeon: Celso College, MD;  Location: [facility];  Service: Cardiovascular;  Laterality: Right;   PARTIAL HYSTERECTOMY     STUMP REVISION Right 03/26/2022   Procedure: BKA REVISION;  Surgeon: Celso College, MD;  Location: [facility];  Service: General;  Laterality: Right;   Patient Active Problem List   Diagnosis Date Noted   Hx of BKA, right (HCC) 08/08/2022   Lactic acidosis 03/21/2022   Obesity (BMI 30-39.9) 03/21/2022   PAD (peripheral artery disease) (HCC) 03/21/2022   Osteomyelitis (HCC) 03/19/2022   Diabetic infection of right foot (HCC) 03/19/2022   Essential hypertension 03/19/2022   Leukocytosis 03/19/2022   AKI (acute  kidney injury) (HCC) 03/19/2022   Sepsis with acute renal failure without septic shock (HCC) 03/19/2022   Hyperlipidemia 05/17/2014   Type 2 diabetes mellitus (HCC) 05/17/2014   Anemia, unspecified 05/18/2013   Benign essential hypertension 05/18/2013   REFERRING PROVIDER: Celso College, MD  REFERRING DIAG: s/p R BKA  THERAPY DIAG:  Difficulty in walking, not elsewhere classified  Hx of BKA, right (HCC)  Muscle weakness (generalized)  Unsteadiness on feet  Gait difficulty  Rationale for Evaluation and Treatment: Rehabilitation  ONSET DATE: 03/22/22  SUBJECTIVE:   SUBJECTIVE STATEMENT: Pt. Had a sore on R foot resulting in R BKA on 03/22/22.  Pt. Entered Pt with use of w/c and daughter Ira Mann) present.  Pt. Wants to return to living independently.  Pt. Completing HEP from rehab center.  Pt. Has been unable to get into shower.    PERTINENT HISTORY: MALKIA NIPPERT is a 78 y.o. (November 10, 1945) female who presents with chief complaint of     Chief Complaint  Patient presents with   Venous Insufficiency  .   History of Present Illness: Patient returns today in follow up of her right BKA.  It is healed.  She is in the process of getting her prosthesis now.  She is doing well.  No pain.  No left leg ulceration or rest pain.    Assessment/Plan   PAD (peripheral artery disease) (HCC) Check left ABI on follow  up visit in 3 months.    Type 2 diabetes mellitus (HCC) blood glucose control important in reducing the progression of atherosclerotic disease. Also, involved in wound healing. On appropriate medications.    Essential hypertension blood pressure control important in reducing the progression of atherosclerotic disease. On appropriate oral medications.    Hx of BKA, right (HCC) Healed.  In the process of getting her prosthesis.  A new prescription given for physical therapy for prosthesis and ambulatory training.   Mikki Alexander, MD   08/08/2022 10:42 AM  PAIN:  Are you having  pain? No.  Pt. Reports phantom limb symptoms but not pain.    PRECAUTIONS: Fall  RED FLAGS: None   WEIGHT BEARING RESTRICTIONS: No  FALLS:  Has patient fallen in last 6 months? No  LIVING ENVIRONMENT: Lives with: lives with their daughter  Ira Mann).   Lives in: House/apartment Stairs: No Has following equipment at home: Otho Blitz - 2 wheeled, Wheelchair (manual), and Ramped entry  OCCUPATION: Retired  PLOF: Independent.  Pt. Does not drive.    PATIENT GOALS: Ambulate with least assistive device.  Return home independently/ go back to church.    NEXT MD VISIT: Wava Hagedorn 7/31.  MD f/u in September?  OBJECTIVE:   PATIENT SURVEYS:  FOTO initial 29/ goal 72.    COGNITION: Overall cognitive status: Within functional limits for tasks assessed   SENSATION: WFL  EDEMA:  No R residual limb swelling noted.  PT discussed use of shrinker and proper use of ply socks for improved fit of prosthetic leg.    POSTURE: rounded shoulders and forward head  PALPATION: Minimal tenderness along R distal residual limb/ incision.  Good incision healing/ PT discussed benefits of scar massage.    LOWER EXTREMITY ROM:  Active ROM Right eval Left eval  Hip flexion [facility] WFL  Hip extension NT NT  Hip abduction [facility]  Hip adduction    Hip internal rotation    Hip external rotation    Knee flexion [facility] WFL  Knee extension -3 deg. 0 deg.  Ankle dorsiflexion    Ankle plantarflexion    Ankle inversion    Ankle eversion     (Blank rows = not tested)  LOWER EXTREMITY MMT:  MMT Right eval Left eval  Hip flexion 4/5 4/5  Hip extension    Hip abduction 3+/5 4/5  Hip adduction    Hip internal rotation    Hip external rotation    Knee flexion 4+/5  4+/5  Knee extension 4+/5 5/5  Ankle dorsiflexion    Ankle plantarflexion    Ankle inversion    Ankle eversion     (Blank rows = not tested)   B UE AROM WFL.  B UE strength grossly 4/5 MMT except bicep/tricep 4+/5 MMT  FUNCTIONAL TESTS:   5 times sit to stand: TBD Timed up and go (TUG): TBD 6 minute walk test: TBD  GAIT: Distance walked: 45 feet in gym with RW/ min. A for safety and cuing Assistive device utilized: Walker - 2 wheeled Level of assistance: Min A Comments: amb. In gym with use of RW/min A for cuing to increase step length/ heel strike.  Pt. Fearful while walking outside of //-bars.  Pt. Ambs. In //-bars with B UE assist and increase cadence/ step pattern.  Use of mirror for posture correction/ heel strike (pt. Does not looking at a mirror).    : 305 feet with SPC - pt's prosthetic started sliding into extreme ER, time paused  briefly half-way through to allow pt time to reapply the leg  FOTO: 60 (goal met)  B hip strength: flexion 4/5 MMT, L quad 5, R quad 4, B hamstring 4+/5.    TODAY'S TREATMENT:                                                                                                                              DATE: 07/24/2023    Subjective:  Pt. entered PT gym with use of QC and R prosthetic leg donned.  Pt. States she is walking around house on a regular basis with use of RW or QC.  Pt. Still remains hesitant/ fearful walking with no assistive device.   Pt. Scheduled to see Wava Hagedorn to adjust prosthetic leg on 6/24.    Treatment:   There.ex.:  No charge  Nustep L4 5 min. B UE/LE.  Discussed daily walking.  HR: 83 bpm.  O2 sat.: 95%  There.act.:  Walking in clinic/ gym with QC and gait reassessment.  Cuing to increase step length/ heel strike, esp. On L LE.  Progressing gait in clinic without use of QC and CGA for safety, esp. Stepping over thresholds/ turning.    Walking in //-bars: 6 hurdles (recip. Pattern)- 1 UE assist on //-bars for safety.  Focus on preventing R hip circumduction/ increasing hip flexion.  Attempted no UE with step to pattern and extra time for L hip flexion.  No LOB but extra time required.     STS from gray chair at stairs with SBA/CGA for safety and verbal  cuing. Sit to stands with 1 UE assist for safety.  Pt. Challenged with no UE and benefits from 1 UE assist for safety.      Step ups/ down at stairs (recip. Touches/ step ups with L UE assist only).    Walking in gym/ hallway (168 feet) with no assistive device and focus on consistent recip. Gait pattern.  Pt. still requires occasional VC's for increased step length with LLE.   Pt. Requires CGA while walking with no assistive device for safety/ cuing.   Pt. Walked outside to car with no assistive device and CGA for safety.  Decrease L step length during R LE stance phase of gait.     PATIENT EDUCATION:  Education details: Discussed HEP/ standing hip ex. At [employer] Person educated: Patient and Child(ren) Education method: Medical illustrator Education comprehension: verbalized understanding and returned demonstration  HOME EXERCISE PROGRAM: Access Code: [email or web address] URL: [email or web address] Date: 08/30/2022 Prepared by: Janine Melbourne  Exercises - Sit to Stand with Counter Support  - 1 x daily - 5 x weekly - 3 sets - 10 reps - Standing Hip Abduction with Counter Support  - 1 x daily - 5 x weekly - 3 sets - 10 reps - Standing March with Counter Support  - 1 x daily - 5 x weekly - 3 sets - 10 reps - Standing Hip Extension with Counter  Support  - 1 x daily - 5 x weekly - 3 sets - 10 reps  ASSESSMENT:  CLINICAL IMPRESSION:        Pt. Arrived to treatment session ready to participate.  Pt. Reports being more active over past week and feeling better overall.  Pt. Ambulates with mod. Independence and use of QC with short distances.  Pt. Progressing to walking with no assistive device to manage smaller spaces at home (bathroom/ kitchen).  Pt. Ambulates in hallway/gym without use of QC and 1 seated rest break.  Pt. will continue to benefit from skilled PT services 1x/week to improve her ambulation and function with using a least assistive device (quad cane)/ no device  with good safety awareness.    OBJECTIVE IMPAIRMENTS: Abnormal gait, decreased activity tolerance, decreased balance, decreased endurance, decreased mobility, difficulty walking, decreased ROM, decreased strength, decreased safety awareness, improper body mechanics, postural dysfunction, and pain.   ACTIVITY LIMITATIONS: carrying, lifting, standing, squatting, stairs, transfers, bed mobility, bathing, toileting, dressing, and locomotion level  PARTICIPATION LIMITATIONS: meal prep, cleaning, laundry, shopping, community activity, and church  PERSONAL FACTORS: Fitness and Past/current experiences are also affecting patient's functional outcome.   REHAB POTENTIAL: Good  CLINICAL DECISION MAKING: Evolving/moderate complexity  EVALUATION COMPLEXITY: Moderate   GOALS: Goals reviewed with patient? Yes  LONG TERM GOALS: Target date: 09/17/23  1.  Pt. Will increase B LE muscle strength 1/2 muscle grade to improve pain-free mobility/ walking.   Baseline:  See above Goal status: Partially met  2. Pt will demonstrate ascending/descending at least 4 stairs with reciprocal pattern and no UE assist to improve independence with community mobility.  Baseline: step-to pattern with BUE assist.  6/10: recip. Gait pattern requiring UE assist  Goal status: Partially met  3. Pt will be able to ambulate at least 500 feet during while using the QC in order to show improvement in walking tolerance and community mobility.  Baseline: 11/11: 305 feet with QC.  12/31: 250 feet with QC and progressing to no assistive device for short distances.  4/30: pt. Ambulates 185 feet with no assistive device.    Goal status: Not met (progressing)  4.  Pt. Able to vacuum living room with good LE control and no LOB to improve independence.    Baseline: pt. Received vacuum for Christmas but dtr. Continues to vacuum at this time.   Goal status: On-going  5.  Pt. Able to ambulate from living to kitchen/ bathroom with  mod. I and no assistive safety to improve independence/ functional mobility.  Baseline: pt. Starting to amb. With CGA in clinic without QC  Goal status: Partially met  PLAN:  PT FREQUENCY: 1-2x/week  PT DURATION: 8 weeks  PLANNED INTERVENTIONS: Therapeutic exercises, Therapeutic activity, Neuromuscular re-education, Balance training, Gait training, Patient/Family education, Self Care, Joint mobilization, Stair training, Prosthetic training, DME instructions, Manual therapy, and Re-evaluation  PLAN FOR NEXT SESSION:  Check authorization dates/ schedule  Lendell Quarry, PT, DPT # [phone] Physical Therapist - [facility]  [facility] 12:55 PM,07/24/23

## 2023-07-24 ENCOUNTER — Encounter: Payer: Self-pay | Admitting: Physical Therapy

## 2023-08-07 ENCOUNTER — Ambulatory Visit: Admitting: Physical Therapy

## 2023-08-07 ENCOUNTER — Encounter: Payer: Self-pay | Admitting: Physical Therapy

## 2023-08-07 DIAGNOSIS — R269 Unspecified abnormalities of gait and mobility: Secondary | ICD-10-CM

## 2023-08-07 DIAGNOSIS — R2681 Unsteadiness on feet: Secondary | ICD-10-CM

## 2023-08-07 DIAGNOSIS — M6281 Muscle weakness (generalized): Secondary | ICD-10-CM

## 2023-08-07 DIAGNOSIS — Z89511 Acquired absence of right leg below knee: Secondary | ICD-10-CM

## 2023-08-07 DIAGNOSIS — R262 Difficulty in walking, not elsewhere classified: Secondary | ICD-10-CM

## 2023-08-07 NOTE — Therapy (Signed)
 OUTPATIENT PHYSICAL THERAPY LOWER EXTREMITY TREATMENT  Patient Name: Melanie Juarez MRN: [id number] DOB:1945-03-08, 78 y.o., female Today's Date: 08/07/2023  END OF SESSION:  PT End of Session - 08/07/23 0727     Visit Number 48    Number of Visits 55    Date for PT Re-Evaluation 09/17/23    PT Start Time 0724    PT Stop Time 0813    PT Time Calculation (min) 49 min    Equipment Utilized During Treatment Gait belt    Activity Tolerance Patient tolerated treatment well    Behavior During Therapy [facility] for tasks assessed/performed         Past Medical History:  Diagnosis Date   Diabetes (HCC)    Employs prosthetic leg    Right, below the knee   Hypertension    Vertigo    spring 2024   Past Surgical History:  Procedure Laterality Date   AMPUTATION Right 03/22/2022   Procedure: AMPUTATION BELOW KNEE-Guillotine;  Surgeon: Marea Selinda RAMAN, MD;  Location: [facility] ORS;  Service: Vascular;  Laterality: Right;   CATARACT EXTRACTION W/PHACO Left 06/19/2023   Procedure: PHACOEMULSIFICATION, CATARACT, WITH IOL INSERTION 7.76 00:39.9;  Surgeon: Mittie Gaskin, MD;  Location: [facility];  Service: Ophthalmology;  Laterality: Left;   LOWER EXTREMITY ANGIOGRAPHY Right 03/21/2022   Procedure: Lower Extremity Angiography;  Surgeon: Marea Selinda RAMAN, MD;  Location: [facility] INVASIVE CV LAB;  Service: Cardiovascular;  Laterality: Right;   PARTIAL HYSTERECTOMY     STUMP REVISION Right 03/26/2022   Procedure: BKA REVISION;  Surgeon: Marea Selinda RAMAN, MD;  Location: [facility] ORS;  Service: General;  Laterality: Right;   Patient Active Problem List   Diagnosis Date Noted   Hx of BKA, right (HCC) 08/08/2022   Lactic acidosis 03/21/2022   Obesity (BMI 30-39.9) 03/21/2022   PAD (peripheral artery disease) (HCC) 03/21/2022   Osteomyelitis (HCC) 03/19/2022   Diabetic infection of right foot (HCC) 03/19/2022   Essential hypertension 03/19/2022   Leukocytosis 03/19/2022   AKI (acute kidney injury) (HCC)  03/19/2022   Sepsis with acute renal failure without septic shock (HCC) 03/19/2022   Hyperlipidemia 05/17/2014   Type 2 diabetes mellitus (HCC) 05/17/2014   Anemia, unspecified 05/18/2013   Benign essential hypertension 05/18/2013   REFERRING PROVIDER: Marea Selinda RAMAN, MD  REFERRING DIAG: s/p R BKA  THERAPY DIAG:  Difficulty in walking, not elsewhere classified  Hx of BKA, right (HCC)  Muscle weakness (generalized)  Unsteadiness on feet  Gait difficulty  Rationale for Evaluation and Treatment: Rehabilitation  ONSET DATE: 03/22/22  SUBJECTIVE:   SUBJECTIVE STATEMENT: Pt. Had a sore on R foot resulting in R BKA on 03/22/22.  Pt. Entered Pt with use of w/c and daughter Dorma) present.  Pt. Wants to return to living independently.  Pt. Completing HEP from rehab center.  Pt. Has been unable to get into shower.    PERTINENT HISTORY: ARGUSTA MCGANN is a 78 y.o. (1945/04/26) female who presents with chief complaint of     Chief Complaint  Patient presents with   Venous Insufficiency  .   History of Present Illness: Patient returns today in follow up of her right BKA.  It is healed.  She is in the process of getting her prosthesis now.  She is doing well.  No pain.  No left leg ulceration or rest pain.    Assessment/Plan   PAD (peripheral artery disease) (HCC) Check left ABI on follow up visit in 3  months.    Type 2 diabetes mellitus (HCC) blood glucose control important in reducing the progression of atherosclerotic disease. Also, involved in wound healing. On appropriate medications.    Essential hypertension blood pressure control important in reducing the progression of atherosclerotic disease. On appropriate oral medications.    Hx of BKA, right (HCC) Healed.  In the process of getting her prosthesis.  A new prescription given for physical therapy for prosthesis and ambulatory training.   Selinda Gu, MD   08/08/2022 10:42 AM  PAIN:  Are you having pain? No.  Pt. Reports  phantom limb symptoms but not pain.    PRECAUTIONS: Fall  RED FLAGS: None   WEIGHT BEARING RESTRICTIONS: No  FALLS:  Has patient fallen in last 6 months? No  LIVING ENVIRONMENT: Lives with: lives with their daughter  Dorma).   Lives in: House/apartment Stairs: No Has following equipment at home: Vannie - 2 wheeled, Wheelchair (manual), and Ramped entry  OCCUPATION: Retired  PLOF: Independent.  Pt. Does not drive.    PATIENT GOALS: Ambulate with least assistive device.  Return home independently/ go back to church.    NEXT MD VISIT: Garrel Mulch 7/31.  MD f/u in September?  OBJECTIVE:   PATIENT SURVEYS:  FOTO initial 29/ goal 79.    COGNITION: Overall cognitive status: Within functional limits for tasks assessed   SENSATION: WFL  EDEMA:  No R residual limb swelling noted.  PT discussed use of shrinker and proper use of ply socks for improved fit of prosthetic leg.    POSTURE: rounded shoulders and forward head  PALPATION: Minimal tenderness along R distal residual limb/ incision.  Good incision healing/ PT discussed benefits of scar massage.    LOWER EXTREMITY ROM:  Active ROM Right eval Left eval  Hip flexion [facility] WFL  Hip extension NT NT  Hip abduction [facility]  Hip adduction    Hip internal rotation    Hip external rotation    Knee flexion [facility] WFL  Knee extension -3 deg. 0 deg.  Ankle dorsiflexion    Ankle plantarflexion    Ankle inversion    Ankle eversion     (Blank rows = not tested)  LOWER EXTREMITY MMT:  MMT Right eval Left eval  Hip flexion 4/5 4/5  Hip extension    Hip abduction 3+/5 4/5  Hip adduction    Hip internal rotation    Hip external rotation    Knee flexion 4+/5  4+/5  Knee extension 4+/5 5/5  Ankle dorsiflexion    Ankle plantarflexion    Ankle inversion    Ankle eversion     (Blank rows = not tested)   B UE AROM WFL.  B UE strength grossly 4/5 MMT except bicep/tricep 4+/5 MMT  FUNCTIONAL TESTS:  5 times sit to  stand: TBD Timed up and go (TUG): TBD 6 minute walk test: TBD  GAIT: Distance walked: 45 feet in gym with RW/ min. A for safety and cuing Assistive device utilized: Walker - 2 wheeled Level of assistance: Min A Comments: amb. In gym with use of RW/min A for cuing to increase step length/ heel strike.  Pt. Fearful while walking outside of //-bars.  Pt. Ambs. In //-bars with B UE assist and increase cadence/ step pattern.  Use of mirror for posture correction/ heel strike (pt. Does not looking at a mirror).    : 305 feet with SPC - pt's prosthetic started sliding into extreme ER, time paused briefly half-way through to  allow pt time to reapply the leg  FOTO: 60 (goal met)  B hip strength: flexion 4/5 MMT, L quad 5, R quad 4, B hamstring 4+/5.    TODAY'S TREATMENT:                                                                                                                              DATE: 08/07/2023    Subjective:  Pt. entered PT gym with use of QC and R prosthetic leg donned.  Pt. Had a f/u with Garrel Mulch to adjust prosthetic leg and returns to [place] in 2 weeks.  Pt. States she only slept 2 hours last night because she is thinking .    Treatment:   There.ex.:  No charge  Nustep L4 5 min. B UE/LE.  Discussed daily walking.  HR: 83 bpm.  O2 sat.: 95%  There.act.:  Obstacle course: 3 step overs/ cone taps/ 6 step ups/ Airex pads/ turning/ increase BOS with cone placement in hallway.    Walking in clinic/ gym with QC and gait reassessment.  Cuing to increase step length/ heel strike, esp. On L LE.  Progressing gait in clinic without use of QC and CGA for safety, esp. Stepping over thresholds/ turning.    Walking in //-bars with no UE assist and increase L step length during R LE wt. Bearing.  No LOB but extra time required.  Pt. Tends to return to L step to gait without UE assist and benefits from cuing.       STS from gray chair/ blue mat table with SBA/CGA for safety and  verbal cuing. Sit to stands with 1 UE assist for safety.  Pt. Challenged with no UE and benefits from 1 UE assist for safety.      Walking in gym with no assistive device and focus on SBA/CGA for safety with step pattern/ upright posture.  1 LOB with a scissoring gait requiring PT assist to stabilize pt. And return to upright posture.     PATIENT EDUCATION:  Education details: Discussed HEP/ standing hip ex. At [employer] Person educated: Patient and Child(ren) Education method: Medical illustrator Education comprehension: verbalized understanding and returned demonstration  HOME EXERCISE PROGRAM: Access Code: [email or web address] URL: [email or web address] Date: 08/30/2022 Prepared by: Maryanne Finder  Exercises - Sit to Stand with Counter Support  - 1 x daily - 5 x weekly - 3 sets - 10 reps - Standing Hip Abduction with Counter Support  - 1 x daily - 5 x weekly - 3 sets - 10 reps - Standing March with Counter Support  - 1 x daily - 5 x weekly - 3 sets - 10 reps - Standing Hip Extension with Counter Support  - 1 x daily - 5 x weekly - 3 sets - 10 reps  ASSESSMENT:  CLINICAL IMPRESSION:        Pt. ambulates with mod. Independence and use of QC with short distances.  Pt. Progressing  to walking with no assistive device to manage smaller spaces at home (bathroom/ kitchen).  1 LOB during tx/session requiring PT assist to stabilize pt. And return to upright posture.  Pt. Instructed to walk around home every hour with use of QC and focus on L step length.  Pt. will continue to benefit from skilled PT services 1x/week to improve her ambulation and function with using a least assistive device (quad cane)/ no device with good safety awareness.    OBJECTIVE IMPAIRMENTS: Abnormal gait, decreased activity tolerance, decreased balance, decreased endurance, decreased mobility, difficulty walking, decreased ROM, decreased strength, decreased safety awareness, improper body mechanics,  postural dysfunction, and pain.   ACTIVITY LIMITATIONS: carrying, lifting, standing, squatting, stairs, transfers, bed mobility, bathing, toileting, dressing, and locomotion level  PARTICIPATION LIMITATIONS: meal prep, cleaning, laundry, shopping, community activity, and church  PERSONAL FACTORS: Fitness and Past/current experiences are also affecting patient's functional outcome.   REHAB POTENTIAL: Good  CLINICAL DECISION MAKING: Evolving/moderate complexity  EVALUATION COMPLEXITY: Moderate  GOALS: Goals reviewed with patient? Yes  LONG TERM GOALS: Target date: 09/17/23  1.  Pt. Will increase B LE muscle strength 1/2 muscle grade to improve pain-free mobility/ walking.   Baseline:  See above Goal status: Partially met  2. Pt will demonstrate ascending/descending at least 4 stairs with reciprocal pattern and no UE assist to improve independence with community mobility.  Baseline: step-to pattern with BUE assist.  6/10: recip. Gait pattern requiring UE assist  Goal status: Partially met  3. Pt will be able to ambulate at least 500 feet during while using the QC in order to show improvement in walking tolerance and community mobility.  Baseline: 11/11: 305 feet with QC.  12/31: 250 feet with QC and progressing to no assistive device for short distances.  4/30: pt. Ambulates 185 feet with no assistive device.    Goal status: Not met (progressing)  4.  Pt. Able to vacuum living room with good LE control and no LOB to improve independence.    Baseline: pt. Received vacuum for Christmas but dtr. Continues to vacuum at this time.   Goal status: On-going  5.  Pt. Able to ambulate from living to kitchen/ bathroom with mod. I and no assistive safety to improve independence/ functional mobility.  Baseline: pt. Starting to amb. With CGA in clinic without QC  Goal status: Partially met  PLAN:  PT FREQUENCY: 1-2x/week  PT DURATION: 8 weeks  PLANNED INTERVENTIONS: Therapeutic  exercises, Therapeutic activity, Neuromuscular re-education, Balance training, Gait training, Patient/Family education, Self Care, Joint mobilization, Stair training, Prosthetic training, DME instructions, Manual therapy, and Re-evaluation  PLAN FOR NEXT SESSION:  Discuss vacuuming at home  Ozell JAYSON Sero, PT, DPT # [phone] Physical Therapist - [facility]  [facility] 8:40 AM,08/07/23

## 2023-08-15 ENCOUNTER — Ambulatory Visit: Attending: Vascular Surgery | Admitting: Physical Therapy

## 2023-08-15 DIAGNOSIS — R2681 Unsteadiness on feet: Secondary | ICD-10-CM | POA: Diagnosis present

## 2023-08-15 DIAGNOSIS — R262 Difficulty in walking, not elsewhere classified: Secondary | ICD-10-CM | POA: Diagnosis present

## 2023-08-15 DIAGNOSIS — Z89511 Acquired absence of right leg below knee: Secondary | ICD-10-CM | POA: Diagnosis present

## 2023-08-15 DIAGNOSIS — R269 Unspecified abnormalities of gait and mobility: Secondary | ICD-10-CM | POA: Insufficient documentation

## 2023-08-15 DIAGNOSIS — M6281 Muscle weakness (generalized): Secondary | ICD-10-CM | POA: Diagnosis present

## 2023-08-18 NOTE — Therapy (Signed)
 OUTPATIENT PHYSICAL THERAPY LOWER EXTREMITY TREATMENT  Patient Name: Melanie Juarez MRN: [id number] DOB:12-01-1945, 78 y.o., female Today's Date: 08/15/2023  END OF SESSION:  PT End of Session - 08/18/23 1926     Visit Number 49    Number of Visits 55    Date for PT Re-Evaluation 09/17/23    PT Start Time 1720    PT Stop Time 1804    PT Time Calculation (min) 44 min    Equipment Utilized During Treatment Gait belt    Activity Tolerance Patient tolerated treatment well    Behavior During Therapy [facility] for tasks assessed/performed         Past Medical History:  Diagnosis Date   Diabetes (HCC)    Employs prosthetic leg    Right, below the knee   Hypertension    Vertigo    spring 2024   Past Surgical History:  Procedure Laterality Date   AMPUTATION Right 03/22/2022   Procedure: AMPUTATION BELOW KNEE-Guillotine;  Surgeon: Marea Selinda RAMAN, MD;  Location: [facility] ORS;  Service: Vascular;  Laterality: Right;   CATARACT EXTRACTION W/PHACO Left 06/19/2023   Procedure: PHACOEMULSIFICATION, CATARACT, WITH IOL INSERTION 7.76 00:39.9;  Surgeon: Mittie Gaskin, MD;  Location: [facility];  Service: Ophthalmology;  Laterality: Left;   LOWER EXTREMITY ANGIOGRAPHY Right 03/21/2022   Procedure: Lower Extremity Angiography;  Surgeon: Marea Selinda RAMAN, MD;  Location: [facility] INVASIVE CV LAB;  Service: Cardiovascular;  Laterality: Right;   PARTIAL HYSTERECTOMY     STUMP REVISION Right 03/26/2022   Procedure: BKA REVISION;  Surgeon: Marea Selinda RAMAN, MD;  Location: [facility] ORS;  Service: General;  Laterality: Right;   Patient Active Problem List   Diagnosis Date Noted   Hx of BKA, right (HCC) 08/08/2022   Lactic acidosis 03/21/2022   Obesity (BMI 30-39.9) 03/21/2022   PAD (peripheral artery disease) (HCC) 03/21/2022   Osteomyelitis (HCC) 03/19/2022   Diabetic infection of right foot (HCC) 03/19/2022   Essential hypertension 03/19/2022   Leukocytosis 03/19/2022   AKI (acute kidney injury) (HCC)  03/19/2022   Sepsis with acute renal failure without septic shock (HCC) 03/19/2022   Hyperlipidemia 05/17/2014   Type 2 diabetes mellitus (HCC) 05/17/2014   Anemia, unspecified 05/18/2013   Benign essential hypertension 05/18/2013   REFERRING PROVIDER: Marea Selinda RAMAN, MD  REFERRING DIAG: s/p R BKA  THERAPY DIAG:  Difficulty in walking, not elsewhere classified  Hx of BKA, right (HCC)  Muscle weakness (generalized)  Unsteadiness on feet  Rationale for Evaluation and Treatment: Rehabilitation  ONSET DATE: 03/22/22  SUBJECTIVE:   SUBJECTIVE STATEMENT: Pt. Had a sore on R foot resulting in R BKA on 03/22/22.  Pt. Entered Pt with use of w/c and daughter Dorma) present.  Pt. Wants to return to living independently.  Pt. Completing HEP from rehab center.  Pt. Has been unable to get into shower.    PERTINENT HISTORY: JENTRI AYE is a 78 y.o. (07-Oct-1945) female who presents with chief complaint of     Chief Complaint  Patient presents with   Venous Insufficiency  .   History of Present Illness: Patient returns today in follow up of her right BKA.  It is healed.  She is in the process of getting her prosthesis now.  She is doing well.  No pain.  No left leg ulceration or rest pain.    Assessment/Plan   PAD (peripheral artery disease) (HCC) Check left ABI on follow up visit in 3 months.  Type 2 diabetes mellitus (HCC) blood glucose control important in reducing the progression of atherosclerotic disease. Also, involved in wound healing. On appropriate medications.    Essential hypertension blood pressure control important in reducing the progression of atherosclerotic disease. On appropriate oral medications.    Hx of BKA, right (HCC) Healed.  In the process of getting her prosthesis.  A new prescription given for physical therapy for prosthesis and ambulatory training.   Selinda Gu, MD   08/08/2022 10:42 AM  PAIN:  Are you having pain? No.  Pt. Reports phantom limb  symptoms but not pain.    PRECAUTIONS: Fall  RED FLAGS: None   WEIGHT BEARING RESTRICTIONS: No  FALLS:  Has patient fallen in last 6 months? No  LIVING ENVIRONMENT: Lives with: lives with their daughter  Dorma).   Lives in: House/apartment Stairs: No Has following equipment at home: Vannie - 2 wheeled, Wheelchair (manual), and Ramped entry  OCCUPATION: Retired  PLOF: Independent.  Pt. Does not drive.    PATIENT GOALS: Ambulate with least assistive device.  Return home independently/ go back to church.    NEXT MD VISIT: Garrel Mulch 7/31.  MD f/u in September?  OBJECTIVE:   PATIENT SURVEYS:  FOTO initial 29/ goal 73.    COGNITION: Overall cognitive status: Within functional limits for tasks assessed   SENSATION: WFL  EDEMA:  No R residual limb swelling noted.  PT discussed use of shrinker and proper use of ply socks for improved fit of prosthetic leg.    POSTURE: rounded shoulders and forward head  PALPATION: Minimal tenderness along R distal residual limb/ incision.  Good incision healing/ PT discussed benefits of scar massage.    LOWER EXTREMITY ROM:  Active ROM Right eval Left eval  Hip flexion [facility] WFL  Hip extension NT NT  Hip abduction [facility]  Hip adduction    Hip internal rotation    Hip external rotation    Knee flexion [facility] WFL  Knee extension -3 deg. 0 deg.  Ankle dorsiflexion    Ankle plantarflexion    Ankle inversion    Ankle eversion     (Blank rows = not tested)  LOWER EXTREMITY MMT:  MMT Right eval Left eval  Hip flexion 4/5 4/5  Hip extension    Hip abduction 3+/5 4/5  Hip adduction    Hip internal rotation    Hip external rotation    Knee flexion 4+/5  4+/5  Knee extension 4+/5 5/5  Ankle dorsiflexion    Ankle plantarflexion    Ankle inversion    Ankle eversion     (Blank rows = not tested)   B UE AROM WFL.  B UE strength grossly 4/5 MMT except bicep/tricep 4+/5 MMT  FUNCTIONAL TESTS:  5 times sit to stand: TBD Timed  up and go (TUG): TBD 6 minute walk test: TBD  GAIT: Distance walked: 45 feet in gym with RW/ min. A for safety and cuing Assistive device utilized: Walker - 2 wheeled Level of assistance: Min A Comments: amb. In gym with use of RW/min A for cuing to increase step length/ heel strike.  Pt. Fearful while walking outside of //-bars.  Pt. Ambs. In //-bars with B UE assist and increase cadence/ step pattern.  Use of mirror for posture correction/ heel strike (pt. Does not looking at a mirror).    : 305 feet with SPC - pt's prosthetic started sliding into extreme ER, time paused briefly half-way through to allow pt time to  reapply the leg  FOTO: 60 (goal met)  B hip strength: flexion 4/5 MMT, L quad 5, R quad 4, B hamstring 4+/5.    TODAY'S TREATMENT:                                                                                                                              DATE: 08/15/2023    Subjective:  Pt. entered PT gym with use of QC and R prosthetic leg donned.  No new complaints or falls reported.    Treatment:   There.ex.:  No charge  Nustep L4 5 min. B UE/LE.  Discussed daily walking/ Garrel Mulch appt. For prosthetic adjustment.  There.act.:  Gait in //-bars:  3 step overs/ 6 step ups/ turning with use of 1 UE and attempting no UE assist.   Walking in clinic/ gym with QC and progressing  to no assistive device and CGA for safety, esp. Stepping over thresholds/ turning.   Walking at agility ladder without use of assistive to focus on consistent step length.    Walking in //-bars with no UE assist and increase L step length during R LE wt. Bearing.  No LOB but extra time required.  Pt. Tends to return to L step to gait without UE assist and benefits from cuing.       Sit to stands with gray chair with 1 UE assist for safety.  Pt. Challenged with no UE and benefits from 1 UE assist for safety.      Walking from gym to waiting room and then out to car with no assistive device  and CGA for safety/ cuing.  No LOB or scissoring gait today but extra time and occasional reaching for wall.     PATIENT EDUCATION:  Education details: Discussed HEP/ standing hip ex. At [employer] Person educated: Patient and Child(ren) Education method: Medical illustrator Education comprehension: verbalized understanding and returned demonstration  HOME EXERCISE PROGRAM: Access Code: [email or web address] URL: [email or web address] Date: 08/30/2022 Prepared by: Maryanne Finder  Exercises - Sit to Stand with Counter Support  - 1 x daily - 5 x weekly - 3 sets - 10 reps - Standing Hip Abduction with Counter Support  - 1 x daily - 5 x weekly - 3 sets - 10 reps - Standing March with Counter Support  - 1 x daily - 5 x weekly - 3 sets - 10 reps - Standing Hip Extension with Counter Support  - 1 x daily - 5 x weekly - 3 sets - 10 reps  ASSESSMENT:  CLINICAL IMPRESSION:        Pt. ambulates with mod. Independence and use of QC with short distances.  Pt. Progressing to walking with no assistive device to manage smaller spaces at home (bathroom/ kitchen).  No LOB during tx/session but pt. Requires extra time while walking without assistive device.  Pt. Has 1 more PT tx. Session approved by insurance and PT will reassess goals and  discuss POC.  Pt. will continue to benefit from skilled PT services 1x/week to improve her ambulation and function with using a least assistive device (quad cane)/ no device with good safety awareness.    OBJECTIVE IMPAIRMENTS: Abnormal gait, decreased activity tolerance, decreased balance, decreased endurance, decreased mobility, difficulty walking, decreased ROM, decreased strength, decreased safety awareness, improper body mechanics, postural dysfunction, and pain.   ACTIVITY LIMITATIONS: carrying, lifting, standing, squatting, stairs, transfers, bed mobility, bathing, toileting, dressing, and locomotion level  PARTICIPATION LIMITATIONS: meal prep,  cleaning, laundry, shopping, community activity, and church  PERSONAL FACTORS: Fitness and Past/current experiences are also affecting patient's functional outcome.   REHAB POTENTIAL: Good  CLINICAL DECISION MAKING: Evolving/moderate complexity  EVALUATION COMPLEXITY: Moderate  GOALS: Goals reviewed with patient? Yes  LONG TERM GOALS: Target date: 09/17/23  1.  Pt. Will increase B LE muscle strength 1/2 muscle grade to improve pain-free mobility/ walking.   Baseline:  See above Goal status: Partially met  2. Pt will demonstrate ascending/descending at least 4 stairs with reciprocal pattern and no UE assist to improve independence with community mobility.  Baseline: step-to pattern with BUE assist.  6/10: recip. Gait pattern requiring UE assist  Goal status: Partially met  3. Pt will be able to ambulate at least 500 feet during while using the QC in order to show improvement in walking tolerance and community mobility.  Baseline: 11/11: 305 feet with QC.  12/31: 250 feet with QC and progressing to no assistive device for short distances.  4/30: pt. Ambulates 185 feet with no assistive device.    Goal status: Not met (progressing)  4.  Pt. Able to vacuum living room with good LE control and no LOB to improve independence.    Baseline: pt. Received vacuum for Christmas but dtr. Continues to vacuum at this time.   Goal status: On-going  5.  Pt. Able to ambulate from living to kitchen/ bathroom with mod. I and no assistive safety to improve independence/ functional mobility.  Baseline: pt. Starting to amb. With CGA in clinic without QC  Goal status: Partially met  PLAN:  PT FREQUENCY: 1-2x/week  PT DURATION: 8 weeks  PLANNED INTERVENTIONS: Therapeutic exercises, Therapeutic activity, Neuromuscular re-education, Balance training, Gait training, Patient/Family education, Self Care, Joint mobilization, Stair training, Prosthetic training, DME instructions, Manual therapy, and  Re-evaluation  PLAN FOR NEXT SESSION:  Discuss vacuuming at home/ check goals.  Last appt.   Ozell JAYSON Sero, PT, DPT # [phone] Physical Therapist - [place]  [facility] 7:27 PM,08/18/23

## 2023-09-12 ENCOUNTER — Encounter: Payer: Self-pay | Admitting: Physical Therapy

## 2023-09-12 ENCOUNTER — Ambulatory Visit: Admitting: Physical Therapy

## 2023-09-12 DIAGNOSIS — Z89511 Acquired absence of right leg below knee: Secondary | ICD-10-CM

## 2023-09-12 DIAGNOSIS — R262 Difficulty in walking, not elsewhere classified: Secondary | ICD-10-CM

## 2023-09-12 DIAGNOSIS — M6281 Muscle weakness (generalized): Secondary | ICD-10-CM

## 2023-09-12 DIAGNOSIS — R2681 Unsteadiness on feet: Secondary | ICD-10-CM

## 2023-09-12 DIAGNOSIS — R269 Unspecified abnormalities of gait and mobility: Secondary | ICD-10-CM

## 2023-09-12 NOTE — Therapy (Signed)
 OUTPATIENT PHYSICAL THERAPY LOWER EXTREMITY TREATMENT Physical Therapy Progress Note  Dates of reporting period  03/04/23   to   09/12/23   Patient Name: Melanie Juarez MRN: [id number] DOB:08/21/1945, 78 y.o., female Today's Date: 09/12/2023  END OF SESSION:  PT End of Session - 09/12/23 0941     Visit Number 50    Number of Visits 55    Date for PT Re-Evaluation 09/17/23    PT Start Time 0940    PT Stop Time 1030    PT Time Calculation (min) 50 min    Equipment Utilized During Treatment Gait belt    Activity Tolerance Patient tolerated treatment well    Behavior During Therapy [facility] for tasks assessed/performed         Past Medical History:  Diagnosis Date   Diabetes (HCC)    Employs prosthetic leg    Right, below the knee   Hypertension    Vertigo    spring 2024   Past Surgical History:  Procedure Laterality Date   AMPUTATION Right 03/22/2022   Procedure: AMPUTATION BELOW KNEE-Guillotine;  Surgeon: Marea Selinda RAMAN, MD;  Location: [facility];  Service: Vascular;  Laterality: Right;   CATARACT EXTRACTION W/PHACO Left 06/19/2023   Procedure: PHACOEMULSIFICATION, CATARACT, WITH IOL INSERTION 7.76 00:39.9;  Surgeon: Mittie Gaskin, MD;  Location: [facility];  Service: Ophthalmology;  Laterality: Left;   LOWER EXTREMITY ANGIOGRAPHY Right 03/21/2022   Procedure: Lower Extremity Angiography;  Surgeon: Marea Selinda RAMAN, MD;  Location: [facility]IVE [facility];  Service: Cardiovascular;  Laterality: Right;   PARTIAL HYSTERECTOMY     STUMP REVISION Right 03/26/2022   Procedure: BKA REVISION;  Surgeon: Marea Selinda RAMAN, MD;  Location: [facility];  Service: General;  Laterality: Right;   Patient Active Problem List   Diagnosis Date Noted   Hx of BKA, right (HCC) 08/08/2022   Lactic acidosis 03/21/2022   Obesity (BMI 30-39.9) 03/21/2022   PAD (peripheral artery disease) (HCC) 03/21/2022   Osteomyelitis (HCC) 03/19/2022   Diabetic infection of right foot (HCC) 03/19/2022   Essential  hypertension 03/19/2022   Leukocytosis 03/19/2022   AKI (acute kidney injury) (HCC) 03/19/2022   Sepsis with acute renal failure without septic shock (HCC) 03/19/2022   Hyperlipidemia 05/17/2014   Type 2 diabetes mellitus (HCC) 05/17/2014   Anemia, unspecified 05/18/2013   Benign essential hypertension 05/18/2013   REFERRING PROVIDER: Marea Selinda RAMAN, MD  REFERRING DIAG: s/p R BKA  THERAPY DIAG:  Difficulty in walking, not elsewhere classified  Hx of BKA, right (HCC)  Muscle weakness (generalized)  Unsteadiness on feet  Gait difficulty  Rationale for Evaluation and Treatment: Rehabilitation  ONSET DATE: 03/22/22  SUBJECTIVE:   SUBJECTIVE STATEMENT: Pt. Had a sore on R foot resulting in R BKA on 03/22/22.  Pt. Entered Pt with use of w/c and daughter Dorma) present.  Pt. Wants to return to living independently.  Pt. Completing HEP from rehab center.  Pt. Has been unable to get into shower.    PERTINENT HISTORY: Melanie Juarez is a 77 y.o. (03/21/1945) female who presents with chief complaint of     Chief Complaint  Patient presents with   Venous Insufficiency  .   History of Present Illness: Patient returns today in follow up of her right BKA.  It is healed.  She is in the process of getting her prosthesis now.  She is doing well.  No pain.  No left leg ulceration or rest pain.  Assessment/Plan   PAD (peripheral artery disease) (HCC) Check left ABI on follow up visit in 3 months.    Type 2 diabetes mellitus (HCC) blood glucose control important in reducing the progression of atherosclerotic disease. Also, involved in wound healing. On appropriate medications.    Essential hypertension blood pressure control important in reducing the progression of atherosclerotic disease. On appropriate oral medications.    Hx of BKA, right (HCC) Healed.  In the process of getting her prosthesis.  A new prescription given for physical therapy for prosthesis and ambulatory training.    Selinda Gu, MD   08/08/2022 10:42 AM  PAIN:  Are you having pain? No.  Pt. Reports phantom limb symptoms but not pain.    PRECAUTIONS: Fall  RED FLAGS: None   WEIGHT BEARING RESTRICTIONS: No  FALLS:  Has patient fallen in last 6 months? No  LIVING ENVIRONMENT: Lives with: lives with their daughter  Dorma).   Lives in: House/apartment Stairs: No Has following equipment at home: Vannie - 2 wheeled, Wheelchair (manual), and Ramped entry  OCCUPATION: Retired  PLOF: Independent.  Pt. Does not drive.    PATIENT GOALS: Ambulate with least assistive device.  Return home independently/ go back to church.    NEXT MD VISIT: Garrel Mulch 7/31.  MD f/u in September?  OBJECTIVE:   PATIENT SURVEYS:  FOTO initial 29/ goal 32.    COGNITION: Overall cognitive status: Within functional limits for tasks assessed   SENSATION: WFL  EDEMA:  No R residual limb swelling noted.  PT discussed use of shrinker and proper use of ply socks for improved fit of prosthetic leg.    POSTURE: rounded shoulders and forward head  PALPATION: Minimal tenderness along R distal residual limb/ incision.  Good incision healing/ PT discussed benefits of scar massage.    LOWER EXTREMITY ROM:  Active ROM Right eval Left eval  Hip flexion [facility] WFL  Hip extension NT NT  Hip abduction [facility]  Hip adduction    Hip internal rotation    Hip external rotation    Knee flexion [facility] WFL  Knee extension -3 deg. 0 deg.  Ankle dorsiflexion    Ankle plantarflexion    Ankle inversion    Ankle eversion     (Blank rows = not tested)  LOWER EXTREMITY MMT:  MMT Right eval Left eval  Hip flexion 4/5 4/5  Hip extension    Hip abduction 3+/5 4/5  Hip adduction    Hip internal rotation    Hip external rotation    Knee flexion 4+/5  4+/5  Knee extension 4+/5 5/5  Ankle dorsiflexion    Ankle plantarflexion    Ankle inversion    Ankle eversion     (Blank rows = not tested)   B UE AROM WFL.  B UE strength  grossly 4/5 MMT except bicep/tricep 4+/5 MMT  FUNCTIONAL TESTS:  5 times sit to stand: TBD Timed up and go (TUG): TBD 6 minute walk test: TBD  GAIT: Distance walked: 45 feet in gym with RW/ min. A for safety and cuing Assistive device utilized: Walker - 2 wheeled Level of assistance: Min A Comments: amb. In gym with use of RW/min A for cuing to increase step length/ heel strike.  Pt. Fearful while walking outside of //-bars.  Pt. Ambs. In //-bars with B UE assist and increase cadence/ step pattern.  Use of mirror for posture correction/ heel strike (pt. Does not looking at a mirror).    : 305  feet with SPC - pt's prosthetic started sliding into extreme ER, time paused briefly half-way through to allow pt time to reapply the leg  FOTO: 60 (goal met)  B hip strength: flexion 4/5 MMT, L quad 5, R quad 4, B hamstring 4+/5.    TODAY'S TREATMENT:                                                                                                                              DATE: 09/12/2023    Subjective:  Pt. entered PT gym with use of QC and R prosthetic leg donned.  No new complaints or falls reported.  Pt. Will f/u with Garrel Mulch next week for change in prosthetic leg. Pt. Has already had mold of residual limb.       Treatment:   There.act.:  Walking in //-bars with light to no UE assist working on maintaining BOS/ consistent reciprocal Gait pattern.    Gait in //-bars:  6 hurdles/ turning with use of 1 UE and attempting no UE assist. Improved R step over hurdles as compared to L.  Increase R LE circumduction if L LE steps over hurdle first.    Walking in clinic/ gym with QC and progressing to no assistive device and CGA for safety, esp. Stepping over thresholds/ turning.   Walking outside of //-bars without use of assistive to focus on consistent step length.     Sit to stands with gray chair with 1 UE assist for safety.  Pt. Challenged with no UE and benefits from 1 UE assist for  safety.      Walking from gym to car in parking lot with QC and SBA/CGA for safety/ cuing.  No LOB or scissoring gait today but extra time during outside walking in parking lot.    There.ex.:  No charge  Nustep L4 5 min. B UE/LE.  Discussed daily walking/ Garrel Mulch appt. For prosthetic adjustment.   PATIENT EDUCATION:  Education details: Discussed HEP/ standing hip ex. At [employer] Person educated: Patient and Child(ren) Education method: Medical illustrator Education comprehension: verbalized understanding and returned demonstration  HOME EXERCISE PROGRAM: Access Code: [email or web address] URL: [email or web address] Date: 08/30/2022 Prepared by: Maryanne Finder  Exercises - Sit to Stand with Counter Support  - 1 x daily - 5 x weekly - 3 sets - 10 reps - Standing Hip Abduction with Counter Support  - 1 x daily - 5 x weekly - 3 sets - 10 reps - Standing March with Counter Support  - 1 x daily - 5 x weekly - 3 sets - 10 reps - Standing Hip Extension with Counter Support  - 1 x daily - 5 x weekly - 3 sets - 10 reps  ASSESSMENT:  CLINICAL IMPRESSION:        Pt. ambulates with mod. Independence and use of QC with short distances.  Pt. Progressing to walking with no assistive device to manage smaller spaces at home (bathroom/  kitchen).  No LOB during tx/session but pt. Requires extra time while walking without assistive device, esp. With increase distance.  If pt. Has questions or issues with new prosthetic leg, pt. Instructed to contact PT.  Pt. Expected to return to Garrel Mulch next week.    OBJECTIVE IMPAIRMENTS: Abnormal gait, decreased activity tolerance, decreased balance, decreased endurance, decreased mobility, difficulty walking, decreased ROM, decreased strength, decreased safety awareness, improper body mechanics, postural dysfunction, and pain.   ACTIVITY LIMITATIONS: carrying, lifting, standing, squatting, stairs, transfers, bed mobility, bathing, toileting,  dressing, and locomotion level  PARTICIPATION LIMITATIONS: meal prep, cleaning, laundry, shopping, community activity, and church  PERSONAL FACTORS: Fitness and Past/current experiences are also affecting patient's functional outcome.   REHAB POTENTIAL: Good  CLINICAL DECISION MAKING: Evolving/moderate complexity  EVALUATION COMPLEXITY: Moderate  GOALS: Goals reviewed with patient? Yes  LONG TERM GOALS: Target date: 09/17/23  1.  Pt. Will increase B LE muscle strength 1/2 muscle grade to improve pain-free mobility/ walking.   Baseline:  See above Goal status: Partially met  2. Pt will demonstrate ascending/descending at least 4 stairs with reciprocal pattern and no UE assist to improve independence with community mobility.  Baseline: step-to pattern with BUE assist.  6/10: recip. Gait pattern requiring UE assist  Goal status: Partially met  3. Pt will be able to ambulate at least 500 feet during while using the QC in order to show improvement in walking tolerance and community mobility.  Baseline: 11/11: 305 feet with QC.  12/31: 250 feet with QC and progressing to no assistive device for short distances.  4/30: pt. Ambulates 185 feet with no assistive device.    Goal status: Not met (progressing)  4.  Pt. Able to vacuum living room with good LE control and no LOB to improve independence.    Baseline: pt. Received vacuum for Christmas but dtr. Continues to vacuum at this time.  7/31: pts. Dtr. Does vacuuming but pt. Able to sweep/mop  Goal status: Partially met  5.  Pt. Able to ambulate from living to kitchen/ bathroom with mod. I and no assistive safety to improve independence/ functional mobility.  Baseline: pt. Starting to amb. With CGA in clinic without QC  Goal status: Partially met  PLAN:  PT FREQUENCY: 1-2x/week  PT DURATION: 8 weeks  PLANNED INTERVENTIONS: Therapeutic exercises, Therapeutic activity, Neuromuscular re-education, Balance training, Gait training,  Patient/Family education, Self Care, Joint mobilization, Stair training, Prosthetic training, DME instructions, Manual therapy, and Re-evaluation  PLAN FOR NEXT SESSION:  Pt.instructed to contact PT after receiving new prostheticleg.   Ozell JAYSON Sero, PT, DPT # [phone] Physical Therapist - [place]  [facility] 5:45 PM,09/12/23

## 2023-11-11 ENCOUNTER — Other Ambulatory Visit: Payer: Self-pay | Admitting: Ophthalmology

## 2023-11-11 DIAGNOSIS — E113511 Type 2 diabetes mellitus with proliferative diabetic retinopathy with macular edema, right eye: Secondary | ICD-10-CM

## 2023-11-19 ENCOUNTER — Other Ambulatory Visit

## 2023-11-21 ENCOUNTER — Other Ambulatory Visit

## 2023-12-12 ENCOUNTER — Encounter (INDEPENDENT_AMBULATORY_CARE_PROVIDER_SITE_OTHER): Payer: Medicare HMO

## 2023-12-12 ENCOUNTER — Ambulatory Visit (INDEPENDENT_AMBULATORY_CARE_PROVIDER_SITE_OTHER): Payer: Medicare HMO | Admitting: Nurse Practitioner

## 2024-01-02 ENCOUNTER — Other Ambulatory Visit (INDEPENDENT_AMBULATORY_CARE_PROVIDER_SITE_OTHER): Payer: Self-pay | Admitting: Vascular Surgery

## 2024-01-02 DIAGNOSIS — Z89511 Acquired absence of right leg below knee: Secondary | ICD-10-CM

## 2024-01-02 DIAGNOSIS — I739 Peripheral vascular disease, unspecified: Secondary | ICD-10-CM

## 2024-01-06 ENCOUNTER — Encounter (INDEPENDENT_AMBULATORY_CARE_PROVIDER_SITE_OTHER)

## 2024-01-06 ENCOUNTER — Ambulatory Visit (INDEPENDENT_AMBULATORY_CARE_PROVIDER_SITE_OTHER): Admitting: Nurse Practitioner

## 2024-02-19 ENCOUNTER — Ambulatory Visit (INDEPENDENT_AMBULATORY_CARE_PROVIDER_SITE_OTHER)

## 2024-02-19 ENCOUNTER — Encounter (INDEPENDENT_AMBULATORY_CARE_PROVIDER_SITE_OTHER): Payer: Self-pay | Admitting: Nurse Practitioner

## 2024-02-19 ENCOUNTER — Ambulatory Visit (INDEPENDENT_AMBULATORY_CARE_PROVIDER_SITE_OTHER): Admitting: Nurse Practitioner

## 2024-02-19 VITALS — BP 169/81 | HR 87 | Resp 17 | Ht 67.5 in | Wt 193.2 lb

## 2024-02-19 DIAGNOSIS — I10 Essential (primary) hypertension: Secondary | ICD-10-CM

## 2024-02-19 DIAGNOSIS — Z89511 Acquired absence of right leg below knee: Secondary | ICD-10-CM

## 2024-02-19 DIAGNOSIS — I739 Peripheral vascular disease, unspecified: Secondary | ICD-10-CM

## 2024-02-20 LAB — VAS US ABI WITH/WO TBI: Left ABI: 1.11

## 2024-02-23 ENCOUNTER — Encounter (INDEPENDENT_AMBULATORY_CARE_PROVIDER_SITE_OTHER): Payer: Self-pay | Admitting: Nurse Practitioner

## 2024-02-23 NOTE — Progress Notes (Signed)
 "  Subjective:    Patient ID: Melanie Juarez, female    DOB: 1945/07/05, 79 y.o.   MRN: [id number] Chief Complaint  Patient presents with   Follow-up    1 year fu + ABI     HPI  Discussed the use of AI scribe software for clinical note transcription with the patient, who gave verbal consent to proceed.  History of Present Illness Melanie Juarez is a 79 year old female with peripheral artery disease and right below-knee amputation who presents for vascular surgery follow-up regarding prosthetic fit and limb symptoms.  She ambulates with a right below-knee prosthesis, which previously required replacement due to poor fit. The current prosthesis fits well and she is able to ambulate, preferring use of the prosthesis or cane over wheelchair to maintain independence.  She experiences intermittent mild pain in the residual limb, primarily localized to the area corresponding to the former ankle. The pain is infrequent and not severe. She denies significant pain in the amputated stump. No persistent or worsening pain, cramping, coldness, or numbness reported.  She notes occasional swelling of the residual limb, which improves with elevation and use of compression socks. Swelling is not constant and is managed with these measures. She remains active and is currently wearing compression socks.  She expresses interest in resuming outpatient physical therapy, having previously attended sessions last year.    Results     Review of Systems  All other systems reviewed and are negative.      Objective:   Physical Exam Vitals reviewed.  HENT:     Head: Normocephalic.  Cardiovascular:     Rate and Rhythm: Normal rate.  Pulmonary:     Effort: Pulmonary effort is normal.  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  Skin:    General: Skin is warm and dry.  Neurological:     Mental Status: She is alert and oriented to person, place, and time.     Gait: Gait  abnormal.  Psychiatric:        Mood and Affect: Mood normal.        Behavior: Behavior normal.        Thought Content: Thought content normal.        Judgment: Judgment normal.     Physical Exam    BP (!) 169/81   Pulse 87   Resp 17   Ht 5' 7.5 (1.715 m)   Wt 193 lb 3.2 oz (87.6 kg)   BMI 29.81 kg/m   Past Medical History:  Diagnosis Date   Diabetes (HCC)    Employs prosthetic leg    Right, below the knee   Hypertension    Vertigo    spring 2024    Social History   Socioeconomic History   Marital status: Divorced    Spouse name: Not on file   Number of children: Not on file   Years of education: Not on file   Highest education level: Not on file  Occupational History   Not on file  Tobacco Use   Smoking status: Never   Smokeless tobacco: Never  Vaping Use   Vaping status: Never Used  Substance and Sexual Activity   Alcohol use: Never   Drug use: Never   Sexual activity: Not Currently  Other Topics Concern   Not on file  Social History Narrative   Not on file   Social Drivers of Health   Tobacco Use: Low Risk (  02/19/2024)   Patient History    Smoking Tobacco Use: Never    Smokeless Tobacco Use: Never    Passive Exposure: Not on file  Financial Resource Strain: Patient Declined (02/27/2023)   Received from [facility]   Overall Financial Resource Strain (CARDIA)    Difficulty of Paying Living Expenses: Patient declined  Food Insecurity: Patient Declined (02/27/2023)   Received from [facility]   Epic    Within the past 12 months, you worried that your food would run out before you got the money to buy more.: Patient declined    Within the past 12 months, the food you bought just didn't last and you didn't have money to get more.: Patient declined  Transportation Needs: Patient Declined (02/27/2023)   Received from [facility] - Transportation    In the past 12 months, has lack of  transportation kept you from medical appointments or from getting medications?: Patient declined    Lack of Transportation (Non-Medical): Patient declined  Physical Activity: Not on file  Stress: Not on file  Social Connections: Not on file  Intimate Partner Violence: Not At Risk (03/20/2022)   Humiliation, Afraid, Rape, and Kick questionnaire    Fear of Current or Ex-Partner: No    Emotionally Abused: No    Physically Abused: No    Sexually Abused: No  Depression (PHQ2-9): Not on file  Alcohol Screen: Not on file  Housing: Patient Declined (02/27/2023)   Received from [facility]   Epic    In the last 12 months, was there a time when you were not able to pay the mortgage or rent on time?: Patient declined    Number of Times Moved in the Last Year: Not on file    At any time in the past 12 months, were you homeless or living in a shelter (including now)?: Patient declined  Utilities: Patient Declined (02/27/2023)   Received from [facility] Utilities    Threatened with loss of utilities: Patient declined  Health Literacy: Not on file    Past Surgical History:  Procedure Laterality Date   AMPUTATION Right 03/22/2022   Procedure: AMPUTATION BELOW KNEE-Guillotine;  Surgeon: Marea Selinda RAMAN, MD;  Location: [facility];  Service: Vascular;  Laterality: Right;   CATARACT EXTRACTION W/PHACO Left 06/19/2023   Procedure: PHACOEMULSIFICATION, CATARACT, WITH IOL INSERTION 7.76 00:39.9;  Surgeon: Mittie Gaskin, MD;  Location: [facility];  Service: Ophthalmology;  Laterality: Left;   LOWER EXTREMITY ANGIOGRAPHY Right 03/21/2022   Procedure: Lower Extremity Angiography;  Surgeon: Marea Selinda RAMAN, MD;  Location: [facility];  Service: Cardiovascular;  Laterality: Right;   PARTIAL HYSTERECTOMY     STUMP REVISION Right 03/26/2022   Procedure: BKA REVISION;  Surgeon: Marea Selinda RAMAN, MD;  Location: [facility];  Service: General;  Laterality: Right;     No family history on file.  Allergies[1]     Latest Ref Rng & Units 07/08/2022    3:24 PM 03/28/2022    4:35 AM 03/27/2022    8:30 PM  CBC  WBC 4.0 - 10.5 K/uL 10.1  16.5    Hemoglobin 12.0 - 15.0 g/dL 88.8  7.4  7.7   Hematocrit 36.0 - 46.0 % 34.8  22.8  23.5   Platelets 150 - 400 K/uL 223  277        CMP     Component Value Date/Time   NA  133 (L) 07/08/2022 1524   K 4.6 07/08/2022 1524   CL 99 07/08/2022 1524   CO2 27 07/08/2022 1524   GLUCOSE 212 (H) 07/08/2022 1524   BUN 19 07/08/2022 1524   CREATININE 0.71 07/08/2022 1524   CALCIUM  9.4 07/08/2022 1524   PROT 8.0 07/08/2022 1524   ALBUMIN 3.9 07/08/2022 1524   AST 24 07/08/2022 1524   ALT 25 07/08/2022 1524   ALKPHOS 61 07/08/2022 1524   BILITOT 1.2 07/08/2022 1524   GFRNONAA >60 07/08/2022 1524     VAS US  ABI WITH/WO TBI Result Date: 02/20/2024  LOWER EXTREMITY DOPPLER STUDY Patient Name:  DONNA SNOOKS  Date of Exam:   02/19/2024 Medical Rec #: [id number]     Accession #:    [id number] Date of Birth: March 14, 1945     Patient Gender: F Patient Age:   11 years Exam Location:  [facility] Procedure:      VAS US  ABI WITH/WO TBI Referring Phys: SELINDA DEW --------------------------------------------------------------------------------  Indications: Peripheral artery disease.  Vascular Interventions: Right BKA on 03/22/22. Performing Technologist: Elsie Churn RT, RDMS, RVT  Examination Guidelines: A complete evaluation includes at minimum, Doppler waveform signals and systolic blood pressure reading at the level of bilateral brachial, anterior tibial, and posterior tibial arteries, when vessel segments are accessible. Bilateral testing is considered an integral part of a complete examination. Photoelectric Plethysmograph (PPG) waveforms and toe systolic pressure readings are included as required and additional duplex testing as needed. Limited examinations for reoccurring indications may be performed as noted.  ABI  Findings: +--------+------------------+-----+--------+-------+ Right   Rt Pressure (mmHg)IndexWaveformComment +--------+------------------+-----+--------+-------+ [id number]                                    +--------+------------------+-----+--------+-------+ +---------+------------------+-----+-----------+-------+ Left     Lt Pressure (mmHg)IndexWaveform   Comment +---------+------------------+-----+-----------+-------+ Brachial 194                                       +---------+------------------+-----+-----------+-------+ PTA      215               1.11 multiphasic        +---------+------------------+-----+-----------+-------+ DP       197               1.02 multiphasic        +---------+------------------+-----+-----------+-------+ Great Toe169               0.87 Normal             +---------+------------------+-----+-----------+-------+ +-------+-----------+-----------+------------+------------+ ABI/TBIToday's ABIToday's TBIPrevious ABIPrevious TBI +-------+-----------+-----------+------------+------------+ Right  BKA                   BKA                      +-------+-----------+-----------+------------+------------+ Left   1.11       0.87       1.26        0.98         +-------+-----------+-----------+------------+------------+ Left ABIs and TBIs appear essentially unchanged compared to prior study on 12/12/22.  Summary: Left: Resting left ankle-brachial index is within normal range. The left toe-brachial index is normal.  *See table(s) above for measurements and observations.   Electronically signed by Selinda Gu MD on 02/20/2024 at 3:54:58 PM.  Final        Assessment & Plan:   1. PAD (peripheral artery disease) (Primary) Peripheral artery disease Well-managed with normal ABI, toe pressures, and biphasic waveforms. No evidence of worsening arterial insufficiency or current wounds/ulcers. - Reviewed normal ABI and toe pressure  results. - Reinforced regular ambulation to promote collateral circulation. - Advised continued use of compression stockings for edema and recommended leg elevation when not ambulating. - Provided anticipatory guidance regarding symptoms of worsening arterial disease and instructed her to report these if she occurs. - Scheduled routine follow-up in one year unless symptoms progress. - VAS US  ABI WITH/WO TBI; Future  2. 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 Ambulatory with prosthesis or cane. Occasional mild phantom limb pain, not functionally limiting. - Referred to outpatient physical therapy at her preferred location and with her preferred therapist. - Encouraged continued use of prosthesis and ambulation as tolerated. - Reinforced use of compression stockings and leg elevation for swelling. - Provided anticipatory guidance regarding signs of vascular compromise and instructed her to report these if she occurs. - Ambulatory referral to Physical Therapy  3. Essential hypertension Continue antihypertensive medications as already ordered, these medications have been reviewed and there are no changes at this time.   Assessment and Plan Assessment & Plan       Medications Ordered Prior to Encounter[2]  There are no Patient Instructions on file for this visit. Return in about 1 year (around 02/18/2025) for 1 year ABI see JD;FB.   Moriah Loughry E Adisynn Suleiman, NP      [1]  Allergies Allergen Reactions   Milk-Related Compounds Nausea And Vomiting   Tilactase Diarrhea   Tomato Rash    Mouth breaks out  [2]  Current Outpatient Medications on File Prior to Visit  Medication Sig Dispense Refill   ascorbic acid (VITAMIN C) 500 MG tablet Take 500 mg by mouth daily.     aspirin EC 81 MG tablet Take 81 mg by mouth daily. Swallow whole.     atenolol  (TENORMIN ) 50 MG tablet Take 50 mg by mouth daily.      atorvastatin  (LIPITOR) 40 MG tablet Take 1 tablet (40 mg total) by mouth daily.     chlorthalidone (HYGROTON) 25 MG tablet Take 25 mg by mouth daily.     ferrous sulfate 325 (65 FE) MG EC tablet Take by mouth.     glipiZIDE  (GLUCOTROL ) 5 MG tablet Take 0.5 tablets (2.5 mg total) by mouth 2 (two) times daily before a meal.     metFORMIN  (GLUCOPHAGE ) 500 MG tablet Take 500 mg by mouth 2 (two) times daily.     ONETOUCH ULTRA test strip USE 1 STRIP DAILY AS DIRECTED     acetaminophen  (TYLENOL ) 500 MG tablet Take 500 mg by mouth every 6 (six) hours as needed. (Patient not taking: Reported on 02/19/2024)     mupirocin  ointment (BACTROBAN ) 2 % Apply 1 Application topically 2 (two) times daily. (Patient not taking: Reported on 02/19/2024) 22 g 3   ondansetron  (ZOFRAN -ODT) 4 MG disintegrating tablet Take 1 tablet (4 mg total) by mouth every 8 (eight) hours as needed for nausea or vomiting. (Patient not taking: Reported on 06/13/2023) 20 tablet 0   [DISCONTINUED] mupirocin  cream (BACTROBAN ) 2 % Apply 1 Application topically 2 (two) times daily. 30 g 3   No current facility-administered medications on file prior to visit.   "

## 2024-03-10 ENCOUNTER — Other Ambulatory Visit

## 2024-03-17 ENCOUNTER — Other Ambulatory Visit

## 2025-02-18 ENCOUNTER — Ambulatory Visit (INDEPENDENT_AMBULATORY_CARE_PROVIDER_SITE_OTHER): Admitting: Nurse Practitioner

## 2025-02-18 ENCOUNTER — Encounter (INDEPENDENT_AMBULATORY_CARE_PROVIDER_SITE_OTHER)
